# Patient Record
Sex: Female | Born: 1985 | Race: Black or African American | Hispanic: No | Marital: Single | Smoking: Never smoker
Health system: Southern US, Community
[De-identification: ages and names within clinical notes are randomized; demographics above are authoritative.]

## PROBLEM LIST (undated history)

## (undated) DIAGNOSIS — R519 Headache, unspecified: Secondary | ICD-10-CM

## (undated) DIAGNOSIS — Z87442 Personal history of urinary calculi: Secondary | ICD-10-CM

## (undated) DIAGNOSIS — Z8489 Family history of other specified conditions: Secondary | ICD-10-CM

## (undated) DIAGNOSIS — Z8614 Personal history of Methicillin resistant Staphylococcus aureus infection: Secondary | ICD-10-CM

## (undated) DIAGNOSIS — R51 Headache: Secondary | ICD-10-CM

## (undated) DIAGNOSIS — M797 Fibromyalgia: Secondary | ICD-10-CM

## (undated) HISTORY — PX: NO PAST SURGERIES: SHX2092

---

## 2011-07-15 DIAGNOSIS — Z8614 Personal history of Methicillin resistant Staphylococcus aureus infection: Secondary | ICD-10-CM

## 2011-07-15 HISTORY — DX: Personal history of Methicillin resistant Staphylococcus aureus infection: Z86.14

## 2016-06-09 ENCOUNTER — Emergency Department: Payer: Self-pay

## 2016-06-09 ENCOUNTER — Emergency Department
Admission: EM | Admit: 2016-06-09 | Discharge: 2016-06-09 | Disposition: A | Discharge status: Home / Self Care | Payer: Self-pay | Attending: Emergency Medicine | Admitting: Emergency Medicine

## 2016-06-09 ENCOUNTER — Encounter: Payer: Self-pay | Admitting: Emergency Medicine

## 2016-06-09 DIAGNOSIS — R079 Chest pain, unspecified: Secondary | ICD-10-CM | POA: Insufficient documentation

## 2016-06-09 DIAGNOSIS — R531 Weakness: Secondary | ICD-10-CM | POA: Insufficient documentation

## 2016-06-09 DIAGNOSIS — R42 Dizziness and giddiness: Secondary | ICD-10-CM | POA: Insufficient documentation

## 2016-06-09 DIAGNOSIS — R110 Nausea: Secondary | ICD-10-CM | POA: Insufficient documentation

## 2016-06-09 LAB — CBC
HEMATOCRIT: 40.3 % (ref 35.0–47.0)
Hemoglobin: 13.5 g/dL (ref 12.0–16.0)
MCH: 28.4 pg (ref 26.0–34.0)
MCHC: 33.5 g/dL (ref 32.0–36.0)
MCV: 84.9 fL (ref 80.0–100.0)
PLATELETS: 341 10*3/uL (ref 150–440)
RBC: 4.75 MIL/uL (ref 3.80–5.20)
RDW: 14.2 % (ref 11.5–14.5)
WBC: 11.5 10*3/uL — AB (ref 3.6–11.0)

## 2016-06-09 LAB — BASIC METABOLIC PANEL
Anion gap: 4 — ABNORMAL LOW (ref 5–15)
BUN: 13 mg/dL (ref 6–20)
CHLORIDE: 105 mmol/L (ref 101–111)
CO2: 28 mmol/L (ref 22–32)
Calcium: 9.3 mg/dL (ref 8.9–10.3)
Creatinine, Ser: 1.07 mg/dL — ABNORMAL HIGH (ref 0.44–1.00)
Glucose, Bld: 97 mg/dL (ref 65–99)
POTASSIUM: 3.9 mmol/L (ref 3.5–5.1)
SODIUM: 137 mmol/L (ref 135–145)

## 2016-06-09 LAB — GLUCOSE, CAPILLARY: GLUCOSE-CAPILLARY: 95 mg/dL (ref 65–99)

## 2016-06-09 LAB — FIBRIN DERIVATIVES D-DIMER (ARMC ONLY): Fibrin derivatives D-dimer (ARMC): 157 (ref 0–499)

## 2016-06-09 LAB — TROPONIN I: Troponin I: <0.03 ng/mL (ref <0.03)

## 2016-06-09 LAB — LIPASE, BLOOD: LIPASE: 19 U/L (ref 11–51)

## 2016-06-09 IMAGING — CR DG CHEST 2V
1 series · 2 of 2 positions shown · non-contrast
Comparison: None.

CLINICAL DATA: 30 y/o  F; mid sternal chest pain.

EXAM:
CHEST  2 VIEW

[Series 1: dg chest 2 view · 0.14mm/px · 2 of 2 slices shown]
[im 1/2]
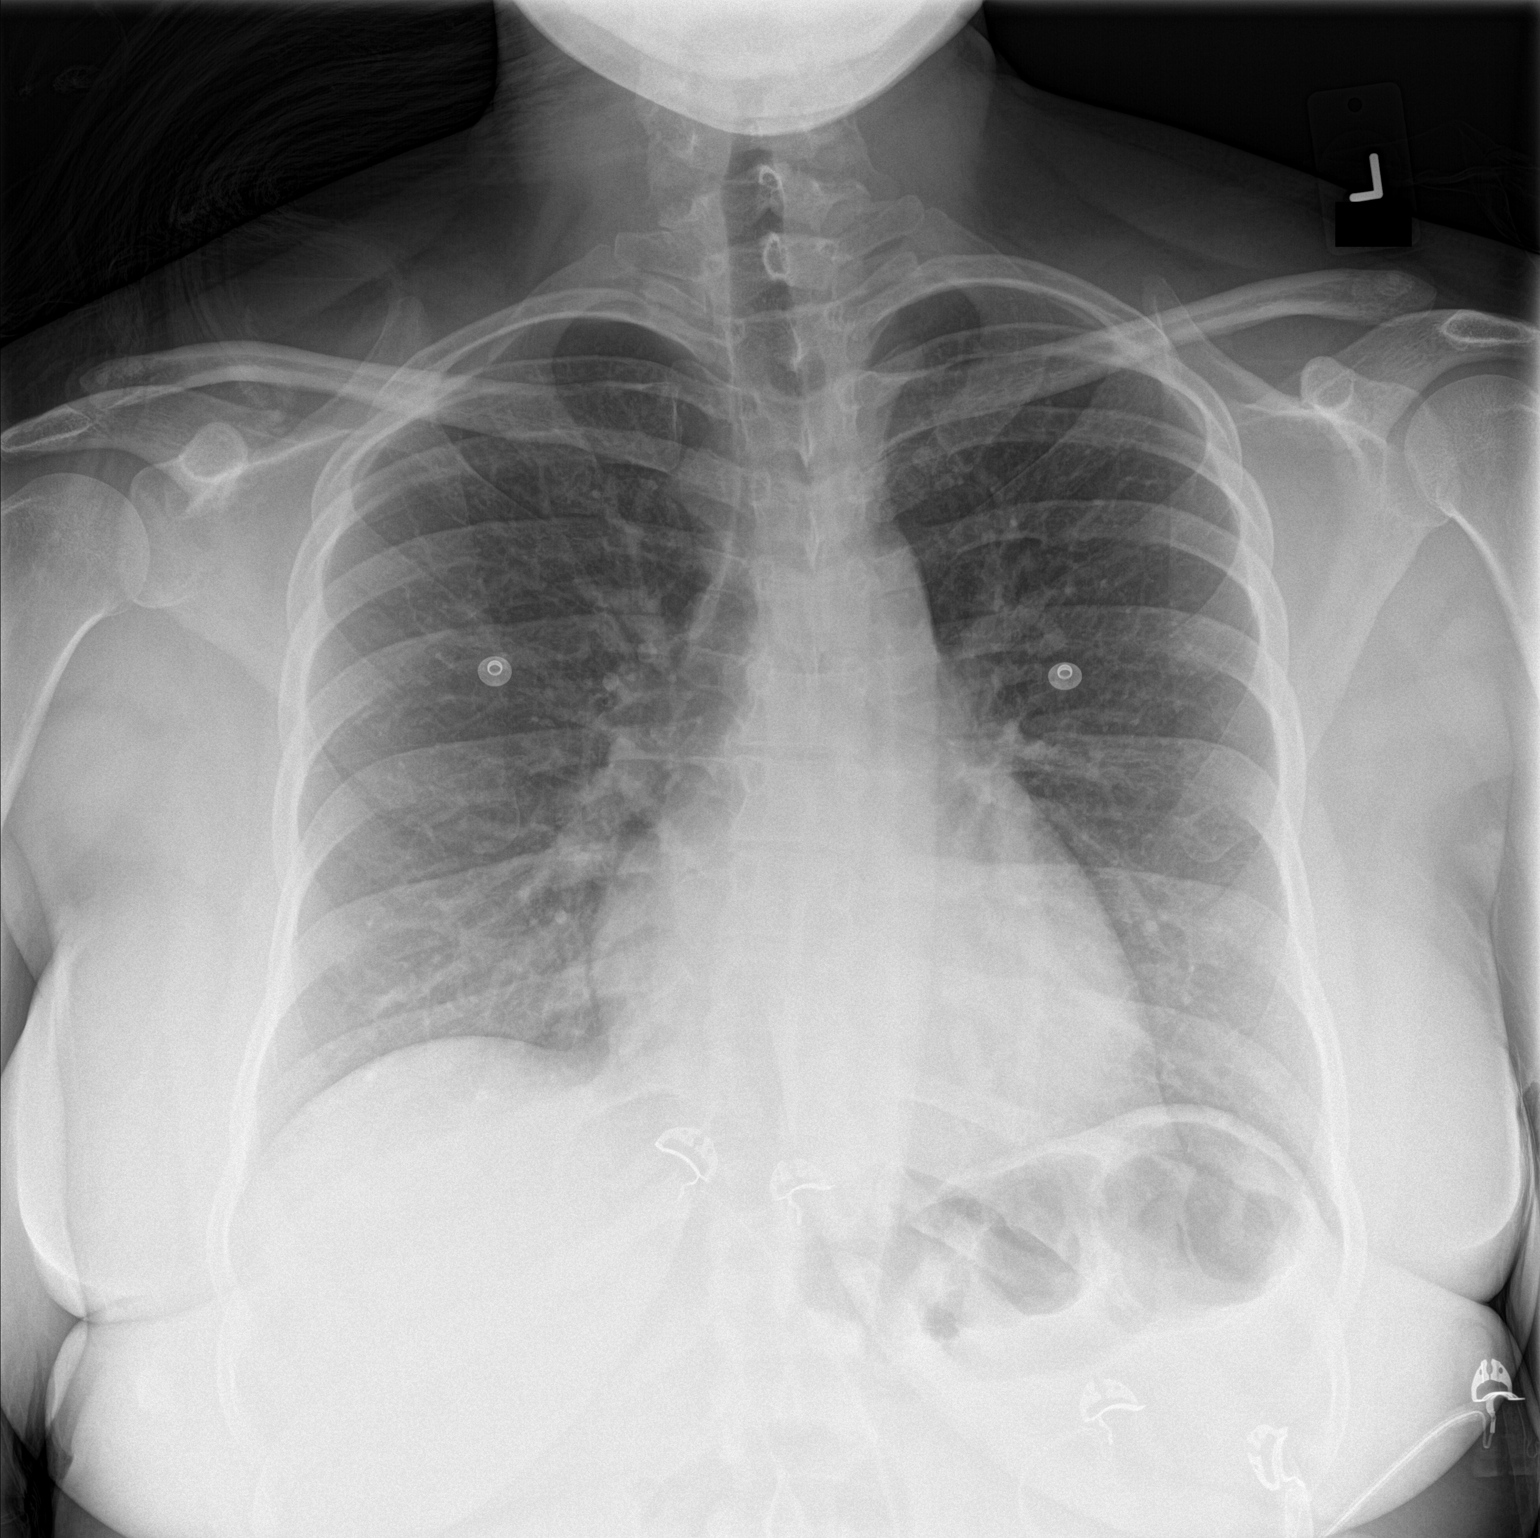
[im 2/2]
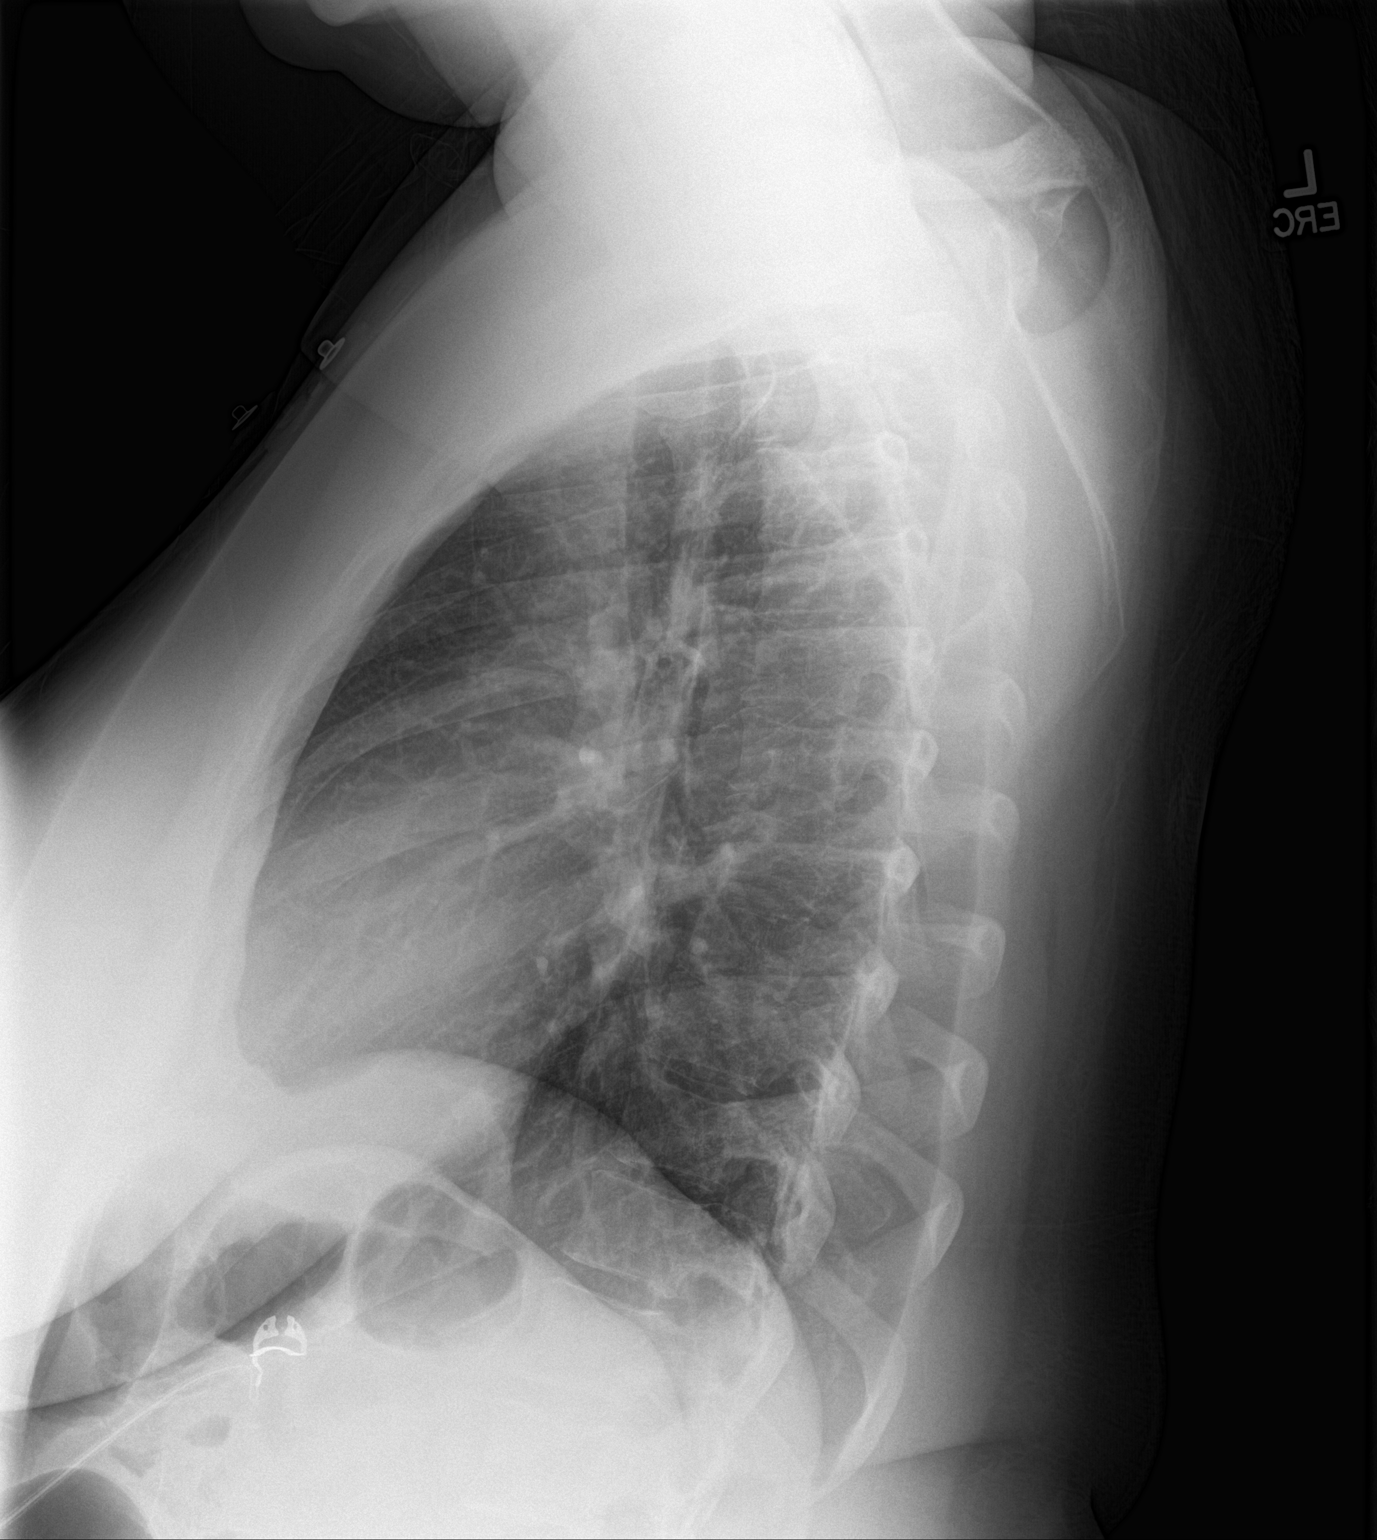

[2 of 2 positions shown; findings below may reference images not displayed]

FINDINGS: The heart size and mediastinal contours are within normal limits.
Both lungs are clear. The visualized skeletal structures are
unremarkable.
IMPRESSION: No active cardiopulmonary disease.

By: ANANKO M.D.

## 2016-06-09 MED ORDER — TRAMADOL HCL 50 MG PO TABS
50.0000 mg | ORAL_TABLET | Freq: Once | ORAL | Status: AC
  Administered 2016-06-09: 50 mg via ORAL
  Filled 2016-06-09: qty 1

## 2016-06-09 MED ORDER — KETOROLAC TROMETHAMINE 30 MG/ML IJ SOLN
30.0000 mg | Freq: Once | INTRAMUSCULAR | Status: AC
  Administered 2016-06-09: 30 mg via INTRAVENOUS
  Filled 2016-06-09: qty 1

## 2016-06-09 MED ORDER — ACETAMINOPHEN 325 MG PO TABS
ORAL_TABLET | ORAL | Status: AC
  Administered 2016-06-09: 650 mg via ORAL
  Filled 2016-06-09: qty 2

## 2016-06-09 MED ORDER — ONDANSETRON HCL 4 MG/2ML IJ SOLN
INTRAMUSCULAR | Status: AC
Start: 2016-06-09 — End: 2016-06-09
  Administered 2016-06-09: 4 mg via INTRAVENOUS
  Filled 2016-06-09: qty 2

## 2016-06-09 MED ORDER — ACETAMINOPHEN 325 MG PO TABS
650.0000 mg | ORAL_TABLET | Freq: Once | ORAL | Status: AC
  Administered 2016-06-09: 650 mg via ORAL

## 2016-06-09 MED ORDER — ONDANSETRON HCL 4 MG/2ML IJ SOLN
4.0000 mg | Freq: Once | INTRAMUSCULAR | Status: AC
  Administered 2016-06-09: 4 mg via INTRAVENOUS

## 2016-06-09 MED ORDER — GI COCKTAIL ~~LOC~~
30.0000 mL | Freq: Once | ORAL | Status: AC
  Administered 2016-06-09: 30 mL via ORAL
  Filled 2016-06-09: qty 30

## 2016-06-09 MED ORDER — SUCRALFATE 1 G PO TABS
1.0000 g | ORAL_TABLET | Freq: Three times a day (TID) | ORAL | Status: DC
  Administered 2016-06-09: 1 g via ORAL
  Filled 2016-06-09: qty 1

## 2016-06-09 NOTE — ED Provider Notes (Signed)
This patient was signed out to me by Dr. Zenda AlpersWebster. Her repeat troponin is negative and her d-dimer is very low probability. I have discussed these negative results with the patient, who is feeling much better at this time. Plan discharge.   Rockne MenghiniAnne-Caroline Ysidro Ramsay, MD 06/09/16 [phone]

## 2016-06-09 NOTE — ED Notes (Signed)
conts to c/o headaches and chest soreness  Dr Sharma CovertNorman aware  Tylenol ordered and given for same   Room dimmed and will reassess in 20 mins

## 2016-06-09 NOTE — Discharge Instructions (Signed)
Please make an appointment with a primary care physician for further evaluation of your chest pain.  Return to the emergency department for severe pain, shortness of breath, palpitations, lightheadedness or fainting, fever, or any other symptoms concerning to you.

## 2016-06-09 NOTE — ED Provider Notes (Signed)
[facility] Emergency Department Provider Note   ____________________________________________   First MD Initiated Contact with Patient 06/09/16 [phone]     (approximate)  I have reviewed the triage vital signs and the nursing notes.   HISTORY  Chief Complaint Chest Pain    HPI Toni Robertson is a 30 y.o. female who comes into the hospital today with chest pain. She reports that the pain is in the middle of her chest. It started around 07/28/2028. The patient was  At lunch for work. She reports that she's never had this pain before. The patient was eating a [place] sandwich. The patient denies any shortness of breath but feels as though someone is here in the chest. The pain is radiating to the back of her left shoulder. The patient is having some weakness and dizziness as well as nausea. She denies any vomiting but did have some elevated blood pressure while at work. The patient reports that her pain is 8 out of 10 in ity currently. She denies abdominal pain but does have some back and shoulder pain. The patient was unsure what was going on so she decided to come in to the hospital today for evaluation.   History reviewed. No pertinent past medical history.  There are no active problems to display for this patient.   History reviewed. No pertinent surgical history.  Prior to Admission medications   Not on File    Allergies Patient has no known allergies.  History reviewed. No pertinent family history.  Social History Social History  Substance Use Topics  . Smoking status: Never Smoker  . Smokeless tobacco: Never Used  . Alcohol use No    Review of Systems Constitutional: No fever/chills Eyes: No visual changes. ENT: No sore throat. Cardiovascular:  chest pain. Respiratory: Denies shortness of breath. Gastrointestinal: Nausea, No abdominal pain.  no vomiting.  No diarrhea.  No constipation. Genitourinary: Negative for  dysuria. Musculoskeletal: Negative for back pain. Skin: Negative for rash. Neurological: Negative for headaches, focal weakness or numbness.  10-point ROS otherwise negative.  ____________________________________________   PHYSICAL EXAM:  VITAL SIGNS: ED Triage Vitals  Enc Vitals Group     BP 06/09/16 0430 136/85     Pulse Rate 06/09/16 0401 85     Resp 06/09/16 0401 14     Temp 06/09/16 0401 98.3 F (36.8 C)     Temp Source 06/09/16 0401 Oral     SpO2 06/09/16 0401 100 %     Weight 06/09/16 0402 256 lb (116.1 kg)     Height 06/09/16 0402 5\' 5"  (1.651 m)     Head Circumference --      Peak Flow --      Pain Score 06/09/16 0402 8     Pain Loc --      Pain Edu? --      Excl. in GC? --     Constitutional: Alert and oriented. Well appearing and in moderate distress. Eyes: Conjunctivae are normal. PERRL. EOMI. Head: Atraumatic. Nose: No congestion/rhinnorhea. Mouth/Throat: Mucous membranes are moist.  Oropharynx non-erythematous. Cardiovascular: Normal rate, regular rhythm. Grossly normal heart sounds.  Good peripheral circulation. Respiratory: Normal respiratory effort.  No retractions. Lungs CTAB. Gastrointestinal: Soft and nontender. No distention. Positive bowel sounds Musculoskeletal: No lower extremity tenderness nor edema.  Neurologic:  Normal speech and language.  Skin:  Skin is warm, dry and intact.  Psychiatric: Mood and affect are normal.   ____________________________________________   LABS (all labs ordered are listed,  but only abnormal results are displayed)  Labs Reviewed  BASIC METABOLIC PANEL - Abnormal; Notable for the following:       Result Value   Creatinine, Ser 1.07 (*)    Anion gap 4 (*)    All other components within normal limits  CBC - Abnormal; Notable for the following:    WBC 11.5 (*)    All other components within normal limits  TROPONIN I  GLUCOSE, CAPILLARY  LIPASE, BLOOD  TROPONIN I  FIBRIN DERIVATIVES D-DIMER ([facility] ONLY)   CBG MONITORING, ED   ____________________________________________  EKG  ED ECG REPORT I, Rebecka ApleyWebster,  Vence Lalor P, the attending physician, personally viewed and interpreted this ECG.   Date: 06/09/2016  EKG Time: 400  Rate: 85  Rhythm: normal sinus rhythm  Axis: normal  Intervals:none  ST&T Change: none  ____________________________________________  RADIOLOGY  CXR ____________________________________________   PROCEDURES  Procedure(s) performed: None  Procedures  Critical Care performed: No  ____________________________________________   INITIAL IMPRESSION / ASSESSMENT AND PLAN / ED COURSE  Pertinent labs & imaging results that were available during my care of the patient were reviewed by me and considered in my medical decision making (see chart for details).  Is a 30 year old female who comes into the hospital today with some chest pain. She was eating when it started. I will give the patient some tramadol and a GI cocktail. The patient will be reassessed.   Clinical Course as of Jun 09 753  [facility]  0520 No active cardiopulmonary disease. DG Chest 2 View [AW]    Clinical Course User Index [AW] Rebecka ApleyAllison P Nikkole Placzek, MD    The patient's pain Returned after some time. I did give the patient some Toradol and Carafate. She then told the nurse that the pain did seem to get worse when she takes a deep breath so I will send a d-dimer as well as a repeat troponin. The patient will be reassessed by Dr. Sharma CovertNorman. ____________________________________________   FINAL CLINICAL IMPRESSION(S) / ED DIAGNOSES  Final diagnoses:  Chest pain, unspecified type      NEW MEDICATIONS STARTED DURING THIS VISIT:  New Prescriptions   No medications on file     Note:  This document was prepared using Dragon voice recognition software and may include unintentional dictation errors.    Rebecka ApleyAllison P Ashana Tullo, MD 06/09/16 [phone]

## 2016-06-09 NOTE — ED Triage Notes (Signed)
Pt arrived by EMS from work with c/o of mid sternal chest pain that started earlier in the night, not relieved by 324mg  Aspirin or 1 nitro given by EMS. Pt is alert and oriented x4 upon arrival to ED, pt c/o 8/10 chest pain and 8/10 headache.

## 2016-06-09 NOTE — ED Notes (Signed)
States her pain is returning  And pain increases with inspiration  Monitor shows NSR  resp even and non labored   Dr Zenda AlpersWebster aware

## 2016-09-29 IMAGING — US US TRANSVAGINAL NON-OB
1 series · 14 of 25 positions shown · non-contrast
Comparison: None

CLINICAL DATA: Pelvic pain

EXAM:
TRANSABDOMINAL AND TRANSVAGINAL ULTRASOUND OF PELVIS
TECHNIQUE: Both transabdominal and transvaginal ultrasound examinations of the
pelvis were performed. Transabdominal technique was performed for
global imaging of the pelvis including uterus, ovaries, adnexal
regions, and pelvic cul-de-sac. It was necessary to proceed with
endovaginal exam following the transabdominal exam to visualize the
uterus, endometrium, ovaries and adnexa .

[Series 1: us transvaginal non-ob · 0.23mm/px · 14 of 108 slices shown]
[im 1/108]
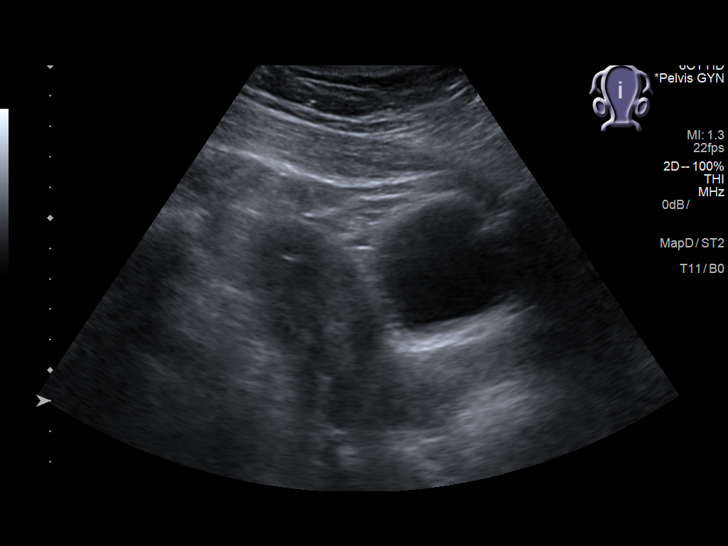
[im 9/108]
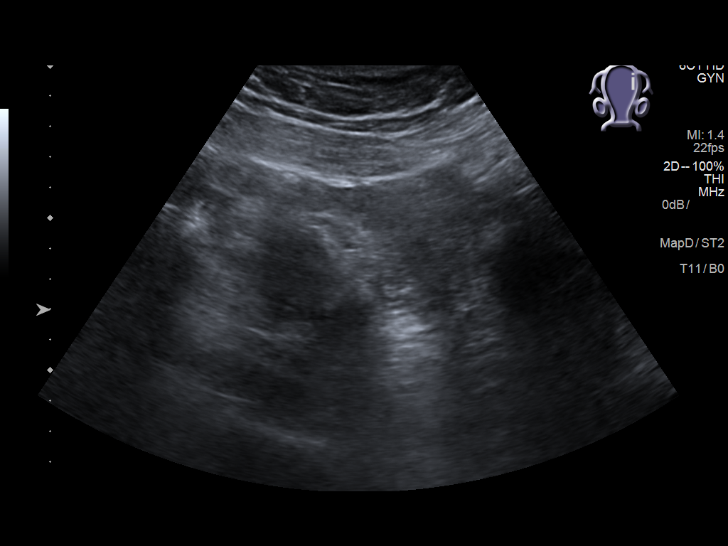
[im 18/108]
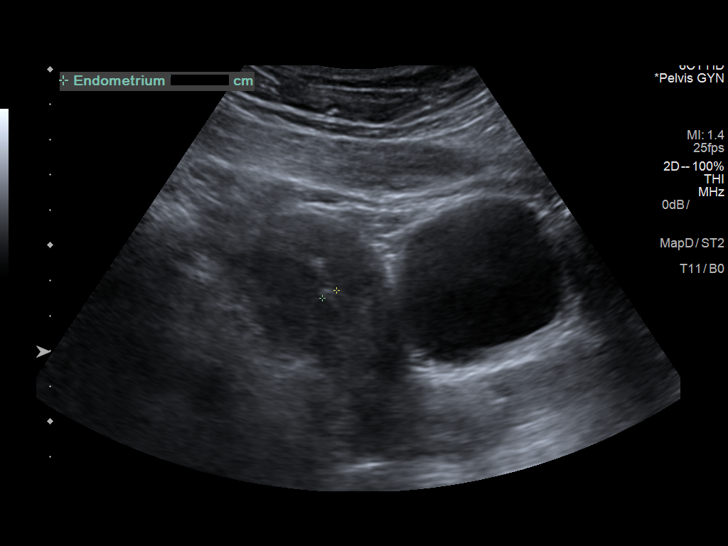
[im 27/108]
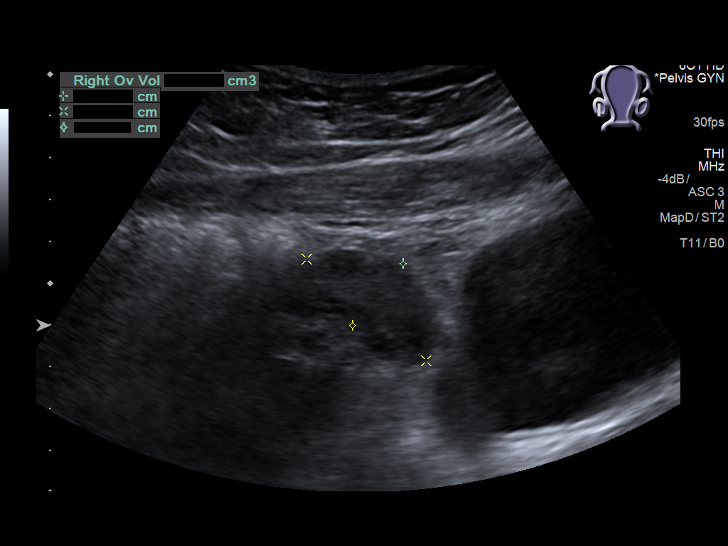
[im 36/108]
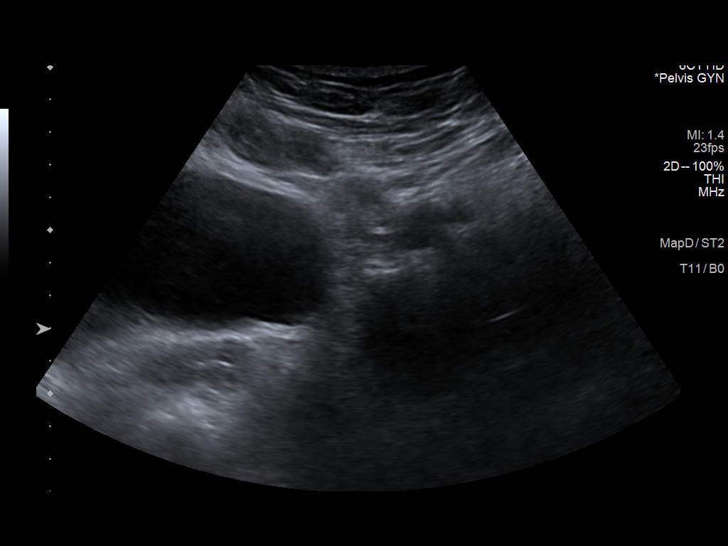
[im 41/108]
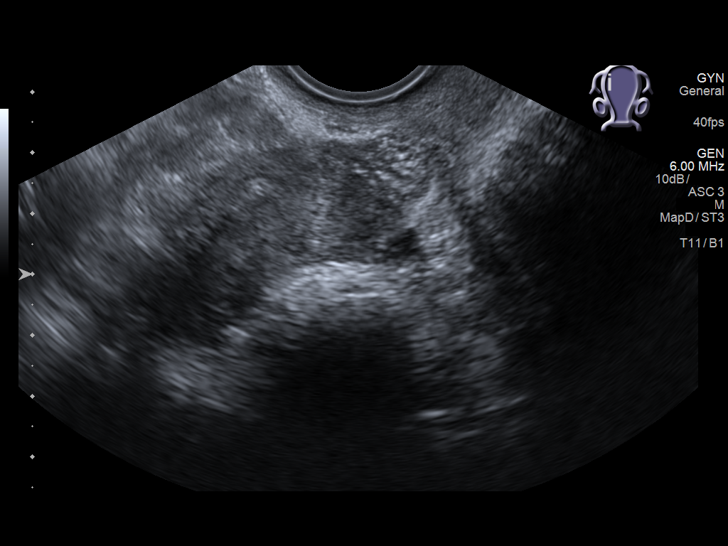
[im 50/108]
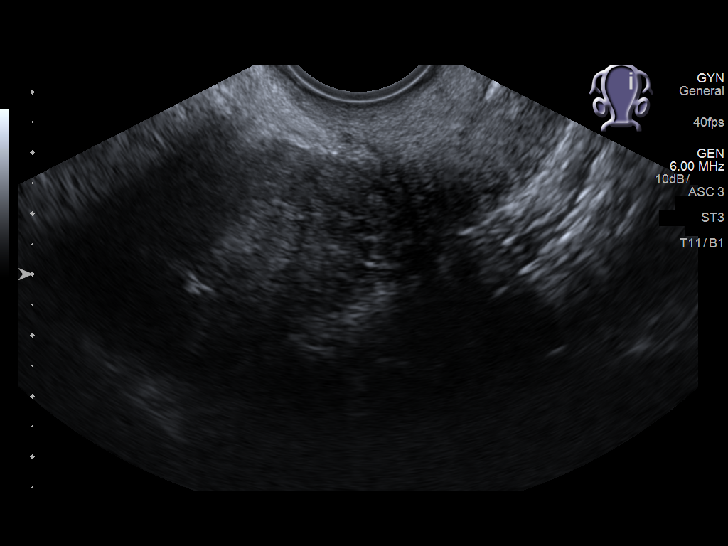
[im 58/108]
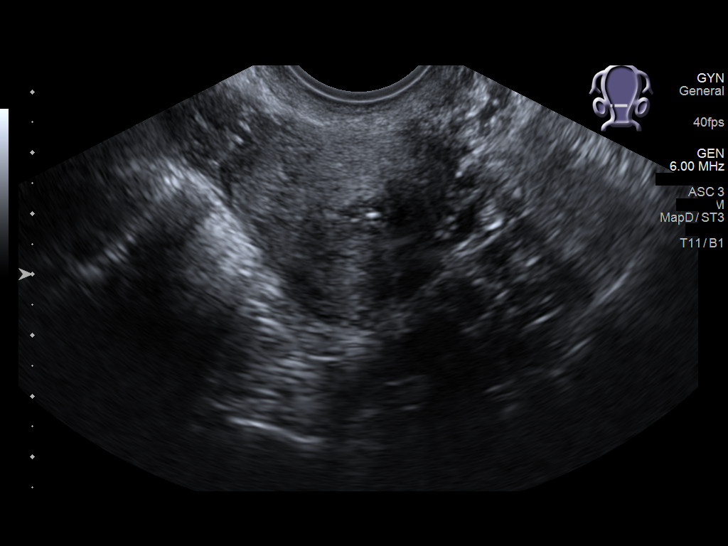
[im 67/108]
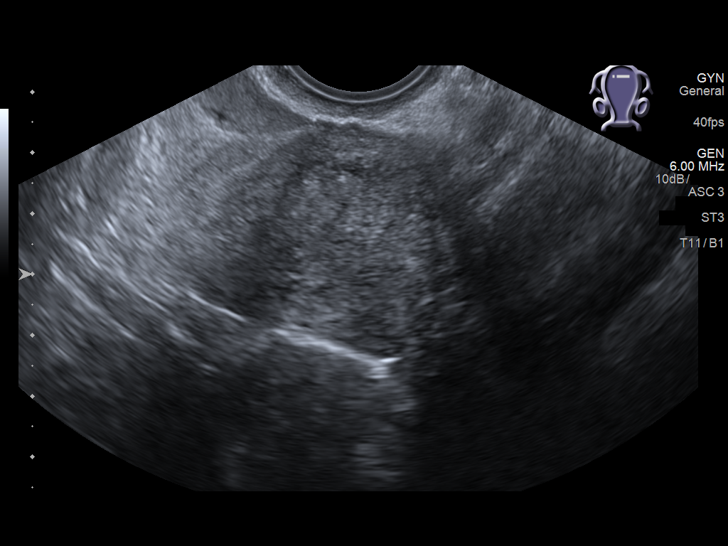
[im 72/108]
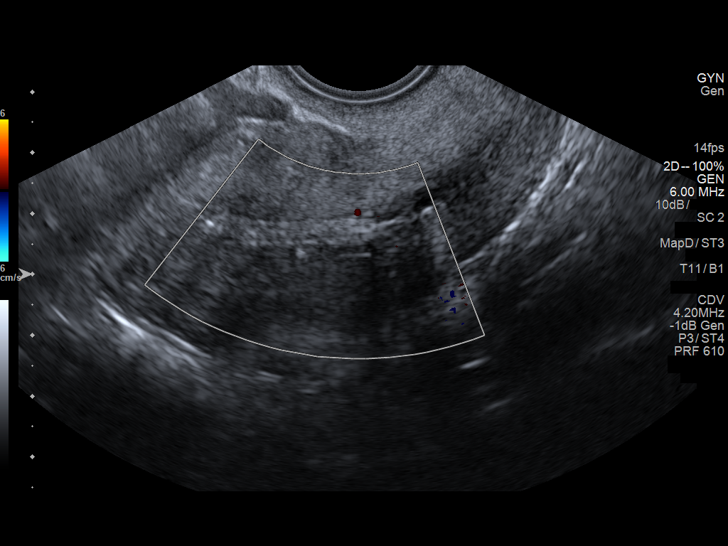
[im 81/108]
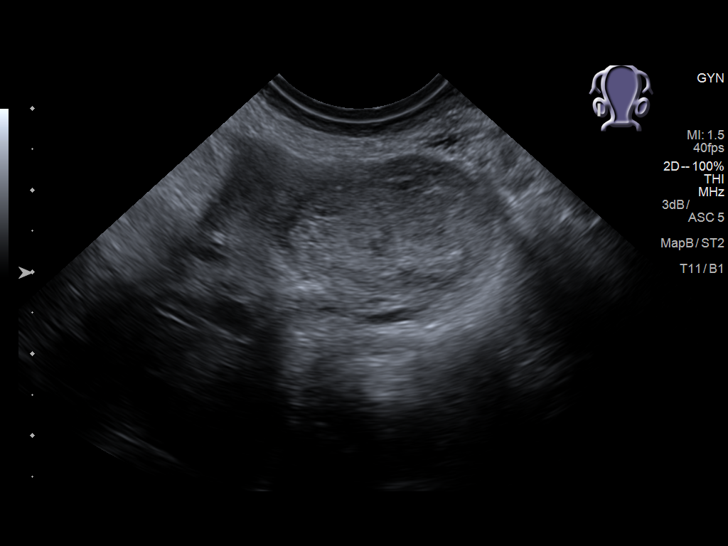
[im 90/108]
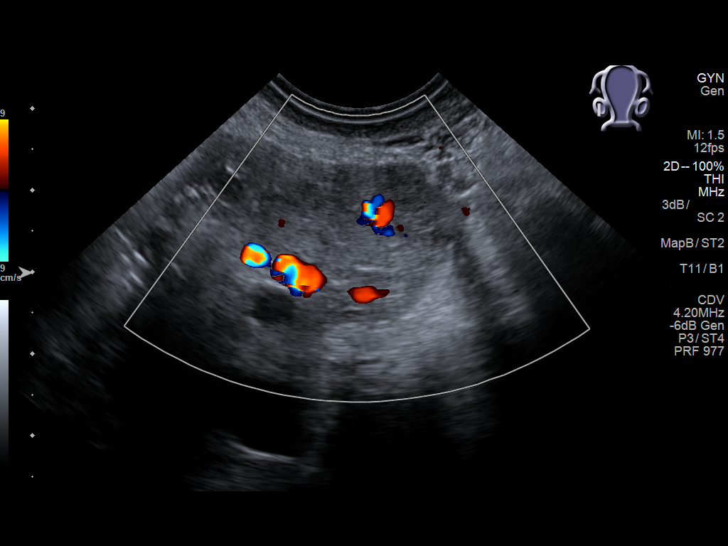
[im 99/108]
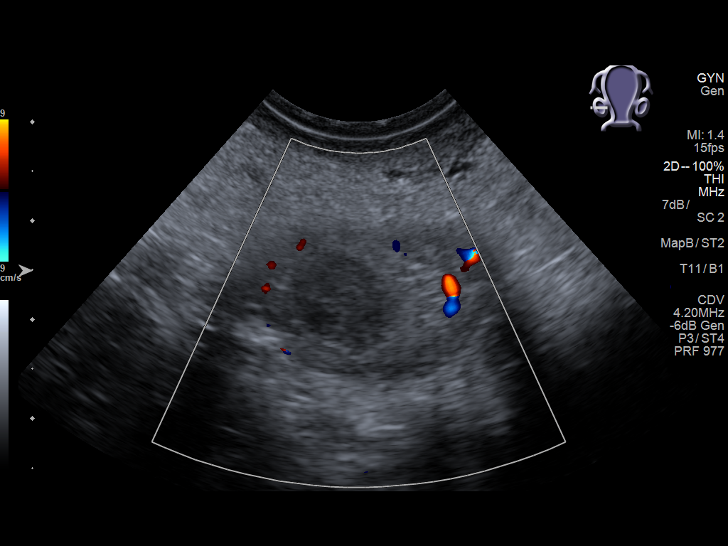
[im 108/108]
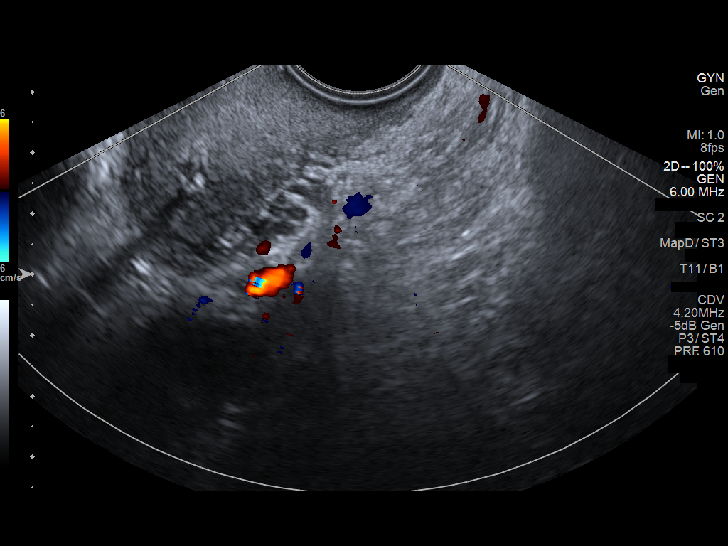

[14 of 25 positions shown; findings below may reference images not displayed]

FINDINGS: Uterus

Measurements: 8.1 x 4.4 x 4.8 cm. No fibroids or other mass
visualized.

Endometrium

Thickness: 5 mm in thickness. IUD in place, somewhat tilted to the
left. No focal abnormality visualized.

Right ovary

Measurements: 4.2 x 2.2 x 3.2 cm. Normal appearance/no adnexal mass.

Left ovary

Measurements: Not visualized. No adnexal mass seen.

Other findings

No abnormal free fluid.
IMPRESSION: Unremarkable pelvic ultrasound.

IUD noted within the endometrium, somewhat tilted to the left.

## 2017-01-02 ENCOUNTER — Ambulatory Visit
Admission: EM | Admit: 2017-01-02 | Discharge: 2017-01-02 | Disposition: A | Discharge status: Home / Self Care | Payer: Self-pay | Attending: Family Medicine | Admitting: Family Medicine

## 2017-01-02 DIAGNOSIS — K0889 Other specified disorders of teeth and supporting structures: Secondary | ICD-10-CM

## 2017-01-02 MED ORDER — IBUPROFEN 800 MG PO TABS: 800.0000 mg | ORAL_TABLET | Freq: Three times a day (TID) | ORAL | 0 refills | Status: DC | PRN

## 2017-01-02 MED ORDER — OXYCODONE-ACETAMINOPHEN 5-325 MG PO TABS: 1.0000 | ORAL_TABLET | Freq: Three times a day (TID) | ORAL | 0 refills | Status: DC | PRN

## 2017-01-02 MED ORDER — PENICILLIN V POTASSIUM 500 MG PO TABS: 500.0000 mg | ORAL_TABLET | Freq: Four times a day (QID) | ORAL | 0 refills | Status: DC

## 2017-01-02 NOTE — Discharge Instructions (Signed)
Take medication as prescribed. Rest. Soft food diet. Drink plenty of fluids.   Follow up with your dentist this week.   Follow up with your primary care physician this week as needed. Return to Urgent care for new or worsening concerns.

## 2017-01-02 NOTE — ED Triage Notes (Signed)
Patient complains of right bottom tooth pain. Patient states that tooth broke today while at work. Patient states that she is scheduled on Monday with her dentist for implants. Patient states that pain is 10/10.

## 2017-01-02 NOTE — ED Provider Notes (Signed)
[facility] ____________________________________________  Time seen: Approximately 7:45 PM  I have reviewed the triage vital signs and the nursing notes.   HISTORY  Chief Complaint Dental Pain    HPI Toni Robertson is a 31 y.o. female  presenting for evaluation of dental pain. Patient reports that she knows she has dental issues and had a bad right upper tooth for a while. Patient reports that she had an appointment for this coming Monday to have tooth pulled and a dental implant placed. Patient reports that earlier today while she was eating the same tooth broke causing pain. Patient reports that pain is a moderate throbbing pain that comes and goes in intensity but always hurts. Reports unrelieved with over-the-counter ibuprofen. Reports hurts to eat and drink, but has continued to eat and drink well. Denies trauma or other injuries. Reports otherwise feels well. Denies any fevers. Denies any cough, congestion, sore throat. Denies any recent sickness or recent antibiotic use. Denies other aggravating or alleviating factors.  Patient's last menstrual period was 12/19/2016. Denies pregnancy   History reviewed. No pertinent past medical history. Denies chronic medical problems. There are no active problems to display for this patient.   Past Surgical History:  Procedure Laterality Date  . NO PAST SURGERIES      Current Outpatient Rx  . Order #: [id number] Class: Normal  . Order #: [id number] Class: Print  . Order #: [id number] Class: Normal    Allergies Patient has no known allergies.  History reviewed. No pertinent family history.  Social History Social History  Substance Use Topics  . Smoking status: Never Smoker  . Smokeless tobacco: Never Used  . Alcohol use No    Review of Systems Constitutional: No fever/chills. Reports continues to eat and drink foods and fluids well.  Eyes: No visual changes. ENT: No sore throat.As above. Cardiovascular: Denies  chest pain. Respiratory: Denies shortness of breath. Gastrointestinal: No abdominal pain.  No nausea, no vomiting.  Genitourinary: Negative for dysuria. Musculoskeletal: Negative for back pain.   ____________________________________________   PHYSICAL EXAM:  VITAL SIGNS: ED Triage Vitals  Enc Vitals Group     BP 01/02/17 1928 (!) 148/83     Pulse Rate 01/02/17 1928 82     Resp 01/02/17 1928 17     Temp 01/02/17 1928 98.9 F (37.2 C)     Temp Source 01/02/17 1928 Oral     SpO2 01/02/17 1928 99 %     Weight 01/02/17 1927 220 lb (99.8 kg)     Height 01/02/17 1927 5\' 4"  (1.626 m)     Head Circumference --      Peak Flow --      Pain Score 01/02/17 1927 9     Pain Loc --      Pain Edu? --      Excl. in GC? --     Constitutional: Alert and oriented. Well appearing and in no acute distress. Eyes: Conjunctivae are normal. Head: Atraumatic.No facial swelling noted. Ears: Bilateral ears no erythema, normal TMs.  Nose: No congestion/rhinnorhea. Mouth/Throat: Mucous membranes are moist.  Oropharynx non-erythematous. No tonsillar swelling or exudate. Periodontal Exam     Fracture with carie present to right upper tooth #5 with localize surrounding gum erythema, no palpated fluctuance or dental abscess noted, widespread dental decay with multiple dental caries and fractures. No other dental tenderness noted. Neck: No stridor.  Hematological/Lymphatic/Immunilogical: No cervical lymphadenopathy. Cardiovascular:   Normal rate, regular rhythm. Grossly normal heart sounds. Good peripheral circulation. Respiratory: Normal respiratory  effort.  No retractions. Musculoskeletal:No midline cervical, thoracic or lumbar tenderness to palpation.  Neurologic:  Normal speech and language.Speech is normal. No gait instability. Skin:  Skin is warm, dry  Psychiatric: Mood and affect are normal. Speech and behavior are normal.  ____________________________________________   LABS (all labs  ordered are listed, but only abnormal results are displayed)  Labs Reviewed - No data to display ____________________________________________   INITIAL IMPRESSION / ASSESSMENT AND PLAN / ED COURSE  Pertinent labs & imaging results that were available during my care of the patient were reviewed by me and considered in my medical decision making (see chart for details).  Well-appearing patient. No acute distress. Right upper dental pain with known cavity that fracture today. Widespread dental decay. As gumline is inflamed appearing will start patient on oral Pen-VK, 800 mg ibuprofen and Percocet as needed for breakthrough pain. Quantity 9 given. Encouraged rest, fluids, soft food diet and supportive care. Follow-up with her dentist this coming Monday. States her dentist was closed today.   [place] controlled substance database reviewed, and no recent controlled medications documented.  Patient was advised to see the dentist.. Also advised to take the antibiotic until finished. Instructed to return to the Urgent Care or ER  for symptoms that change or worsen or if unable to schedule an appointment. ____________________________________________   FINAL CLINICAL IMPRESSION(S) / ED DIAGNOSES  Final diagnoses:  Pain, dental         Renford Dills, NP 01/02/17 1950

## 2017-02-18 ENCOUNTER — Encounter: Payer: Self-pay | Admitting: *Deleted

## 2017-02-18 ENCOUNTER — Ambulatory Visit
Admission: EM | Admit: 2017-02-18 | Discharge: 2017-02-18 | Disposition: A | Discharge status: Home / Self Care | Payer: BLUE CROSS/BLUE SHIELD | Attending: Family Medicine | Admitting: Family Medicine

## 2017-02-18 DIAGNOSIS — R51 Headache: Secondary | ICD-10-CM

## 2017-02-18 DIAGNOSIS — R197 Diarrhea, unspecified: Secondary | ICD-10-CM | POA: Diagnosis not present

## 2017-02-18 DIAGNOSIS — R110 Nausea: Secondary | ICD-10-CM

## 2017-02-18 MED ORDER — ONDANSETRON 8 MG PO TBDP
8.0000 mg | ORAL_TABLET | Freq: Once | ORAL | Status: AC
  Administered 2017-02-18: 8 mg via ORAL

## 2017-02-18 MED ORDER — ONDANSETRON 8 MG PO TBDP: 8.0000 mg | ORAL_TABLET | Freq: Two times a day (BID) | ORAL | 0 refills | Status: DC

## 2017-02-18 NOTE — ED Provider Notes (Signed)
[facility]    CSN: [id number] Arrival date & time: 02/18/17  [id number]     History   Chief Complaint Chief Complaint  Patient presents with  . Diarrhea  . Headache    HPI Toni Robertson is a 31 y.o. female.   HPI  This a 31 year old female presents with 2 day history of headache and diarrhea. He states that the diarrhea is watery but without any blood or mucus. He does not have any abdominal pain but has noticed cramping prior to a BM. She denies any fever or chills. She has started a new job at [employer] and this is been very stressful for her. He denies eating outside of the home. No one else in her immediate family has been sick. She has nausea but has not vomited. She also has not been eating solid food in fear of having to vomit. She states that she had 5 bowel movements so far this morning and had only 3 bowel movements yesterday        History reviewed. No pertinent past medical history.  There are no active problems to display for this patient.   Past Surgical History:  Procedure Laterality Date  . NO PAST SURGERIES      OB History    No data available       Home Medications    Prior to Admission medications   Medication Sig Start Date End Date Taking? Authorizing Provider  ibuprofen (ADVIL,MOTRIN) 800 MG tablet Take 1 tablet (800 mg total) by mouth every 8 (eight) hours as needed for mild pain or moderate pain. 01/02/17   Renford DillsMiller, Lindsey, NP  ondansetron (ZOFRAN ODT) 8 MG disintegrating tablet Take 1 tablet (8 mg total) by mouth 2 (two) times daily. 02/18/17   Lutricia Feiloemer, Saisha [place]ogue P, PA-C  oxyCODONE-acetaminophen (ROXICET) 5-325 MG tablet Take 1 tablet by mouth every 8 (eight) hours as needed for moderate pain or severe pain (Do not drive or operate heavy machinery while taking as can cause drowsiness.). 01/02/17   Renford DillsMiller, Lindsey, NP  penicillin v potassium (VEETID) 500 MG tablet Take 1 tablet (500 mg total) by mouth 4 (four) times daily. 01/02/17   Renford DillsMiller,  Lindsey, NP    Family History History reviewed. No pertinent family history.  Social History Social History  Substance Use Topics  . Smoking status: Never Smoker  . Smokeless tobacco: Never Used  . Alcohol use No     Allergies   Patient has no known allergies.   Review of Systems Review of Systems  Constitutional: Positive for activity change and appetite change. Negative for chills, diaphoresis, fatigue and fever.  Gastrointestinal: Positive for abdominal pain, diarrhea and nausea. Negative for abdominal distention, anal bleeding, blood in stool, constipation and vomiting.  All other systems reviewed and are negative.    Physical Exam Triage Vital Signs ED Triage Vitals  Enc Vitals Group     BP 02/18/17 0845 137/80     Pulse Rate 02/18/17 0845 63     Resp 02/18/17 0845 16     Temp 02/18/17 0845 98.4 F (36.9 C)     Temp Source 02/18/17 0845 Oral     SpO2 02/18/17 0845 100 %     Weight 02/18/17 0847 225 lb (102.1 kg)     Height 02/18/17 0847 5\' 4"  (1.626 m)     Head Circumference --      Peak Flow --      Pain Score --      Pain Loc --  Pain Edu? --      Excl. in GC? --    No data found.   Updated Vital Signs BP 137/80 (BP Location: Left Arm)   Pulse 63   Temp 98.4 F (36.9 C) (Oral)   Resp 16   Ht 5\' 4"  (1.626 m)   Wt 225 lb (102.1 kg)   LMP 02/04/2017 (Approximate)   SpO2 100%   BMI 38.62 kg/m   Visual Acuity Right Eye Distance:   Left Eye Distance:   Bilateral Distance:    Right Eye Near:   Left Eye Near:    Bilateral Near:     Physical Exam  Constitutional: She appears well-developed and well-nourished. No distress.  HENT:  Head: Normocephalic and atraumatic.  Eyes: Pupils are equal, round, and reactive to light. Right eye exhibits no discharge. Left eye exhibits no discharge.  Neck: Normal range of motion.  Pulmonary/Chest: Effort normal and breath sounds normal.  Abdominal: Soft. Bowel sounds are normal. She exhibits no  distension. There is tenderness. There is no rebound and no guarding.  Abdominal examination shows tenderness in the left upper quadrant  through lower. No rebound there is no guarding present. No masses are palpable. Patient is in no acute distress.  Skin: She is not diaphoretic.  Nursing note and vitals reviewed.    UC Treatments / Results  Labs (all labs ordered are listed, but only abnormal results are displayed) Labs Reviewed - No data to display  EKG  EKG Interpretation None       Radiology No results found.  Procedures Procedures (including critical care time)  Medications Ordered in UC Medications  ondansetron (ZOFRAN-ODT) disintegrating tablet 8 mg (8 mg Oral Given 02/18/17 0931)     Initial Impression / Assessment and Plan / UC Course  I have reviewed the triage vital signs and the nursing notes.  Pertinent labs & imaging results that were available during my care of the patient were reviewed by me and considered in my medical decision making (see chart for details).       Final Clinical Impressions(s) / UC Diagnoses   Final diagnoses:  Diarrhea, unspecified type  Nausea     New Prescriptions Discharge Medication List as of 02/18/2017 10:22 AM    START taking these medications   Details  ondansetron (ZOFRAN ODT) 8 MG disintegrating tablet Take 1 tablet (8 mg total) by mouth 2 (two) times daily., Starting Wed 02/18/2017, Normal       Plan: 1. Test/x-ray results and diagnosis reviewed with patient 2. rx as per orders; risks, benefits, potential side effects reviewed with patient 3. Recommend supportive treatment with Hydration to keep urine clear. Use the Zofran to prevent nausea. When you return to eating normally start with a brat diet and advance as tolerated. If she has increased fever more pain bloody stool she should go immediately to the emergency room. We'll keep her out of work today and tomorrow and allow her to return to work on Friday. 4. F/u  prn if symptoms worsen or don't improve   Controlled Substance Prescriptions [place] Controlled Substance Registry consulted? Not Applicable   Lutricia Feil, PA-C 02/18/17 1029

## 2017-02-18 NOTE — ED Triage Notes (Signed)
Headache and diarrhea x2 days.

## 2017-04-01 ENCOUNTER — Encounter: Payer: Self-pay | Admitting: Emergency Medicine

## 2017-04-01 ENCOUNTER — Ambulatory Visit
Admission: EM | Admit: 2017-04-01 | Discharge: 2017-04-01 | Disposition: A | Discharge status: Home / Self Care | Payer: BLUE CROSS/BLUE SHIELD | Attending: Family Medicine | Admitting: Family Medicine

## 2017-04-01 DIAGNOSIS — J029 Acute pharyngitis, unspecified: Secondary | ICD-10-CM | POA: Diagnosis not present

## 2017-04-01 DIAGNOSIS — J069 Acute upper respiratory infection, unspecified: Secondary | ICD-10-CM | POA: Diagnosis not present

## 2017-04-01 DIAGNOSIS — R51 Headache: Secondary | ICD-10-CM

## 2017-04-01 LAB — RAPID STREP SCREEN (MED CTR MEBANE ONLY): Streptococcus, Group A Screen (Direct): NEGATIVE

## 2017-04-01 MED ORDER — KETOROLAC TROMETHAMINE 60 MG/2ML IM SOLN
30.0000 mg | Freq: Once | INTRAMUSCULAR | Status: AC
  Administered 2017-04-01: 30 mg via INTRAMUSCULAR

## 2017-04-01 NOTE — ED Triage Notes (Signed)
Patient c/o sore throat and HAs that started today.  

## 2017-04-01 NOTE — Discharge Instructions (Signed)
Rest. Drink plenty of fluids.  ° °Follow up with your primary care physician this week as needed. Return to Urgent care for new or worsening concerns.  ° °

## 2017-04-01 NOTE — ED Provider Notes (Addendum)
[facility] ____________________________________________  Time seen: Approximately 5:51 PM  I have reviewed the triage vital signs and the nursing notes.   HISTORY  Chief Complaint Sore Throat   HPI Toni Robertson is a 31 y.o. female present for evaluation of some runny nose, nasal congestion, sore throat present since yesterday evening. States today with some headache, mostly to the front of her head, denies abrupt onset. States sore throat does hurt to swallow, and mild to moderate currently. Reports has been still eating and drinking overall well. Denies known fevers. No over the counter medication taken for the same complaints. Denies known sick contacts, but does work around a lot of people. Denies aggravating or alleviating factors.Denies chest pain, shortness of breath, dizziness, vision changes, abdominal pain, dysuria, extremity pain, extremity swelling or rash. Denies recent sickness. Denies recent antibiotic use. States that she had to leave work early today.  Patient's last menstrual period was 03/11/2017 (approximate). Denies pregnancy.   History reviewed. No pertinent past medical history.  There are no active problems to display for this patient.   Past Surgical History:  Procedure Laterality Date  . NO PAST SURGERIES       No current facility-administered medications for this encounter.   Current Outpatient Prescriptions:  .  levonorgestrel (MIRENA) 20 MCG/24HR IUD, 1 each by Intrauterine route once., Disp: , Rfl:  .  ibuprofen (ADVIL,MOTRIN) 800 MG tablet, Take 1 tablet (800 mg total) by mouth every 8 (eight) hours as needed for mild pain or moderate pain., Disp: 15 tablet, Rfl: 0  Allergies Patient has no known allergies.  History reviewed. No pertinent family history.  Social History Social History  Substance Use Topics  . Smoking status: Never Smoker  . Smokeless tobacco: Never Used  . Alcohol use No    Review of  Systems Constitutional: No fever/chills Eyes: No visual changes. ENT: Positive sore throat. Cardiovascular: Denies chest pain. Respiratory: Denies shortness of breath. Gastrointestinal: No abdominal pain.  No nausea, no vomiting.  No diarrhea.   Musculoskeletal: Negative for back pain. Skin: Negative for rash. Neurological: Negative for focal weakness or numbness.    ____________________________________________   PHYSICAL EXAM:  VITAL SIGNS: ED Triage Vitals  Enc Vitals Group     BP 04/01/17 1615 124/64     Pulse Rate 04/01/17 1615 82     Resp 04/01/17 1615 16     Temp 04/01/17 1615 97.7 F (36.5 C)     Temp Source 04/01/17 1615 Oral     SpO2 04/01/17 1615 100 %     Weight 04/01/17 1613 257 lb (116.6 kg)     Height 04/01/17 1613  (1.626 m)     Head Circumference --      Peak Flow --      Pain Score 04/01/17 1613 9     Pain Loc --      Pain Edu? --      Excl. in GC? --    Constitutional: Alert and oriented. Well appearing and in no acute distress. Eyes: Conjunctivae are normal. PERRL.  Head: Atraumatic. No sinus tenderness to palpation. No swelling. No erythema.  Ears: no erythema, normal TMs bilaterally.   Nose:Nasal congestion  Mouth/Throat: Mucous membranes are moist. Mild pharyngeal erythema. No tonsillar swelling or exudate.  Neck: No stridor.  No cervical spine tenderness to palpation. Hematological/Lymphatic/Immunilogical: No cervical lymphadenopathy. Cardiovascular: Normal rate, regular rhythm. Grossly normal heart sounds.  Good peripheral circulation. Respiratory: Normal respiratory effort.  No retractions. No wheezes, rales  or rhonchi. Good air movement.  Gastrointestinal: Soft and nontender. Musculoskeletal: Ambulatory with steady gait. No cervical, thoracic or lumbar tenderness to palpation. Neurologic:  Normal speech and language. No gait instability.  No gross focal neurologic deficits are appreciated. Skin:  Skin appears warm, dry and intact. No  rash noted. Psychiatric: Mood and affect are normal. Speech and behavior are normal.  ___________________________________________   LABS (all labs ordered are listed, but only abnormal results are displayed)  Labs Reviewed  RAPID STREP SCREEN (NOT AT [facility])  CULTURE, GROUP A STREP [facility])   ____________________________________________  PROCEDURES Procedures    INITIAL IMPRESSION / ASSESSMENT AND PLAN / ED COURSE  Pertinent labs & imaging results that were available during my care of the patient were reviewed by me and considered in my medical decision making (see chart for details).  Well-appearing patient. No acute distress. Suspect viral upper respiratory infection. Quick strep negative, will culture. Encouraged rest, fluids, supportive care. 30 mg IM Toradol given once in urgent care. Work note given for today and tomorrow.Discussed indication, risks and benefits of medications with patient.  Discussed follow up with Primary care physician this week. Discussed follow up and return parameters including no resolution or any worsening concerns. Patient verbalized understanding and agreed to plan.   ____________________________________________   FINAL CLINICAL IMPRESSION(S) / ED DIAGNOSES  Final diagnoses:  Pharyngitis, unspecified etiology  Upper respiratory tract infection, unspecified type     Discharge Medication List as of 04/01/2017  6:01 PM      Note: This dictation was prepared with Dragon dictation along with smaller phrase technology. Any transcriptional errors that result from this process are unintentional.          Renford Dills, NP 04/01/17 [phone]

## 2017-04-03 DIAGNOSIS — F411 Generalized anxiety disorder: Secondary | ICD-10-CM | POA: Insufficient documentation

## 2017-04-04 LAB — CULTURE, GROUP A STREP (THRC)

## 2017-05-05 ENCOUNTER — Encounter: Payer: Self-pay | Admitting: Emergency Medicine

## 2017-05-05 ENCOUNTER — Ambulatory Visit
Admission: EM | Admit: 2017-05-05 | Discharge: 2017-05-05 | Disposition: A | Discharge status: Home / Self Care | Payer: BLUE CROSS/BLUE SHIELD | Attending: Family Medicine | Admitting: Family Medicine

## 2017-05-05 DIAGNOSIS — L245 Irritant contact dermatitis due to other chemical products: Secondary | ICD-10-CM | POA: Diagnosis not present

## 2017-05-05 MED ORDER — PREDNISONE 10 MG PO TABS: ORAL_TABLET | ORAL | 0 refills | Status: DC

## 2017-05-05 NOTE — Discharge Instructions (Signed)
Steroids as prescribed.  Take care  Dr. Libbi Towner  

## 2017-05-05 NOTE — ED Provider Notes (Signed)
[facility]    CSN: [id number] Arrival date & time: 05/05/17  1206     History   Chief Complaint Chief Complaint  Patient presents with  . Rash   HPI 31 year old female presents with rash.  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  She states that it leaked and landed on her gloves.  She states that it penetrated through her gloves.  Since that period of time she has had rash, swelling of the dorsum of both hands.  She states that it is painful and burns.  Moderate in severity. She has been using hydrocortisone with no improvement.  She states that it seems to be getting worse.  She has some areas of blistering.  She states that [employer] is not involved as it is a "allergic reaction".  She is due to return to work tonight and does not feel she can do so as she has to wear gloves.  No other associated symptoms.  No other complaints at this time.  PMH - fibromyalgia.  Past Surgical History:  Procedure Laterality Date  . NO PAST SURGERIES     OB History    No data available     Home Medications    Prior to Admission medications   Medication Sig Start Date End Date Taking? Authorizing Provider  amitriptyline (ELAVIL) 10 MG tablet Take 10 mg by mouth at bedtime.   Yes [provider]  ibuprofen (ADVIL,MOTRIN) 800 MG tablet Take 1 tablet (800 mg total) by mouth every 8 (eight) hours as needed for mild pain or moderate pain. 01/02/17   Renford DillsMiller, Lindsey, NP  levonorgestrel (MIRENA) 20 MCG/24HR IUD 1 each by Intrauterine route once.    [provider]  predniSONE (DELTASONE) 10 MG tablet 5 tablets daily x 3 days, then 4 tablets daily x 3 days, then 3 tablets daily x 3 days, then 2 tablets daily x 3 days, then 1 tablet daily x 3 days. 05/05/17   Tommie Samsook, Saina Waage G, DO   Family History History reviewed. No pertinent family history.  Social History Social History  Substance Use Topics  .  Smoking status: Never Smoker  . Smokeless tobacco: Never Used  . Alcohol use No   Allergies   Coconut flavor and Azithromycin  Review of Systems Review of Systems  Constitutional: Negative.   Musculoskeletal:       Swelling of the dorsum of the hands.  Skin: Positive for rash.    Physical Exam Triage Vital Signs ED Triage Vitals  Enc Vitals Group     BP 05/05/17 1227 121/80     Pulse Rate 05/05/17 1227 80     Resp 05/05/17 1227 16     Temp 05/05/17 1227 98.2 F (36.8 C)     Temp Source 05/05/17 1227 Oral     SpO2 05/05/17 1227 100 %     Weight 05/05/17 1224 237 lb (107.5 kg)     Height 05/05/17 1224 5\' 4"  (1.626 m)     Head Circumference --      Peak Flow --      Pain Score 05/05/17 1224 8     Pain Loc --      Pain Edu? --      Excl. in GC? --    No data found.   Updated Vital Signs BP 121/80 (BP Location: Right Arm)   Pulse 80   Temp 98.2 F (36.8 C) (  Oral)   Resp 16   Ht 5\' 4"  (1.626 m)   Wt 237 lb (107.5 kg)   SpO2 100%   BMI 40.68 kg/m   Physical Exam  Constitutional: She is oriented to person, place, and time. She appears well-developed. No distress.  HENT:  Head: Normocephalic and atraumatic.  Pulmonary/Chest: Effort normal. No respiratory distress.  Musculoskeletal: Normal range of motion.  Neurological: She is alert and oriented to person, place, and time.  Skin:  Hands - dorsum of the hands with papular rash. Interdigital areas with blistering. + Erythema.   Psychiatric: She has a normal mood and affect. Her behavior is normal.  Vitals reviewed.  UC Treatments / Results  Labs (all labs ordered are listed, but only abnormal results are displayed) Labs Reviewed - No data to display  EKG  EKG Interpretation None       Radiology No results found.  Procedures Procedures (including critical care time)  Medications Ordered in UC Medications - No data to display   Initial Impression / Assessment and Plan / UC Course  I have  reviewed the triage vital signs and the nursing notes.  Pertinent labs & imaging results that were available during my care of the patient were reviewed by me and considered in my medical decision making (see chart for details).     31 year old female presents with dermatitis.  Secondary to irritant/chemical.  Her rash is quite severe.  Treated with a prednisone taper.  Work note given.  Final Clinical Impressions(s) / UC Diagnoses   Final diagnoses:  Irritant contact dermatitis due to other chemical products    New Prescriptions New Prescriptions   PREDNISONE (DELTASONE) 10 MG TABLET    5 tablets daily x 3 days, then 4 tablets daily x 3 days, then 3 tablets daily x 3 days, then 2 tablets daily x 3 days, then 1 tablet daily x 3 days.    Controlled Substance Prescriptions [place] Controlled Substance Registry consulted? Not Applicable   Tommie Sams, DO 05/05/17 1327

## 2017-05-05 NOTE — ED Triage Notes (Signed)
Patient states that she was at work a week ago and that she was wearing gloves that day and she states that when she pulled the gloves off she had swelling, redness and itching in both hands.

## 2017-06-13 DIAGNOSIS — Z87442 Personal history of urinary calculi: Secondary | ICD-10-CM

## 2017-06-13 HISTORY — DX: Personal history of urinary calculi: Z87.442

## 2017-06-28 ENCOUNTER — Emergency Department: Payer: BLUE CROSS/BLUE SHIELD

## 2017-06-28 ENCOUNTER — Emergency Department
Admission: EM | Admit: 2017-06-28 | Discharge: 2017-06-28 | Disposition: A | Discharge status: Home / Self Care | Payer: BLUE CROSS/BLUE SHIELD | Attending: Emergency Medicine | Admitting: Emergency Medicine

## 2017-06-28 ENCOUNTER — Other Ambulatory Visit: Payer: Self-pay

## 2017-06-28 ENCOUNTER — Encounter: Payer: Self-pay | Admitting: Emergency Medicine

## 2017-06-28 DIAGNOSIS — R102 Pelvic and perineal pain: Secondary | ICD-10-CM | POA: Insufficient documentation

## 2017-06-28 DIAGNOSIS — N83201 Unspecified ovarian cyst, right side: Secondary | ICD-10-CM | POA: Diagnosis not present

## 2017-06-28 DIAGNOSIS — Z79899 Other long term (current) drug therapy: Secondary | ICD-10-CM | POA: Diagnosis not present

## 2017-06-28 LAB — CBC WITH DIFFERENTIAL/PLATELET
BASOS PCT: 0 %
Basophils Absolute: 0 10*3/uL (ref 0–0.1)
Eosinophils Absolute: 0.1 10*3/uL (ref 0–0.7)
Eosinophils Relative: 1 %
HEMATOCRIT: 40.8 % (ref 35.0–47.0)
HEMOGLOBIN: 13.6 g/dL (ref 12.0–16.0)
LYMPHS ABS: 2 10*3/uL (ref 1.0–3.6)
LYMPHS PCT: 21 %
MCH: 28.3 pg (ref 26.0–34.0)
MCHC: 33.4 g/dL (ref 32.0–36.0)
MCV: 85 fL (ref 80.0–100.0)
MONO ABS: 0.5 10*3/uL (ref 0.2–0.9)
MONOS PCT: 6 %
NEUTROS ABS: 6.6 10*3/uL — AB (ref 1.4–6.5)
NEUTROS PCT: 72 %
Platelets: 323 10*3/uL (ref 150–440)
RBC: 4.8 MIL/uL (ref 3.80–5.20)
RDW: 14 % (ref 11.5–14.5)
WBC: 9.2 10*3/uL (ref 3.6–11.0)

## 2017-06-28 LAB — URINALYSIS, ROUTINE W REFLEX MICROSCOPIC
BILIRUBIN URINE: NEGATIVE
Glucose, UA: NEGATIVE mg/dL
KETONES UR: NEGATIVE mg/dL
Leukocytes, UA: NEGATIVE
Nitrite: NEGATIVE
PH: 5 (ref 5.0–8.0)
Protein, ur: NEGATIVE mg/dL
SPECIFIC GRAVITY, URINE: 1.026 (ref 1.005–1.030)

## 2017-06-28 LAB — COMPREHENSIVE METABOLIC PANEL
ALBUMIN: 3.9 g/dL (ref 3.5–5.0)
ALT: 13 U/L — ABNORMAL LOW (ref 14–54)
ANION GAP: 7 (ref 5–15)
AST: 15 U/L (ref 15–41)
Alkaline Phosphatase: 87 U/L (ref 38–126)
BILIRUBIN TOTAL: 0.6 mg/dL (ref 0.3–1.2)
BUN: 16 mg/dL (ref 6–20)
CHLORIDE: 107 mmol/L (ref 101–111)
CO2: 25 mmol/L (ref 22–32)
Calcium: 9.1 mg/dL (ref 8.9–10.3)
Creatinine, Ser: 0.85 mg/dL (ref 0.44–1.00)
GFR calc Af Amer: >60 mL/min (ref >60)
GFR calc non Af Amer: >60 mL/min (ref >60)
GLUCOSE: 95 mg/dL (ref 65–99)
POTASSIUM: 3.6 mmol/L (ref 3.5–5.1)
SODIUM: 139 mmol/L (ref 135–145)
TOTAL PROTEIN: 7.4 g/dL (ref 6.5–8.1)

## 2017-06-28 LAB — CHLAMYDIA/NGC RT PCR (ARMC ONLY)
CHLAMYDIA TR: NOT DETECTED
N gonorrhoeae: NOT DETECTED

## 2017-06-28 LAB — LIPASE, BLOOD: Lipase: 21 U/L (ref 11–51)

## 2017-06-28 LAB — POCT PREGNANCY, URINE: PREG TEST UR: NEGATIVE

## 2017-06-28 LAB — WET PREP, GENITAL
CLUE CELLS WET PREP: NONE SEEN
Sperm: NONE SEEN
TRICH WET PREP: NONE SEEN
Yeast Wet Prep HPF POC: NONE SEEN

## 2017-06-28 IMAGING — CT CT ABD-PELV W/ CM
2 of 4 series · 16 of 46 positions shown, 18 images · IV contrast (APPLIED)
Comparison: None.

CLINICAL DATA: Lower abdominal/ pelvic pain

EXAM:
CT ABDOMEN AND PELVIS WITH CONTRAST
TECHNIQUE: Multidetector CT imaging of the abdomen and pelvis was performed
using the standard protocol following bolus administration of
intravenous contrast.
CONTRAST:  125mL [S2] IOPAMIDOL ([S2]) INJECTION 61%

[Series 2: routine abd/pel with · axial · 0.81mm/px · z∈[-543,-93]mm · 13 of 100 slices shown, 15 images]
[im 5/100  soft-tissue]
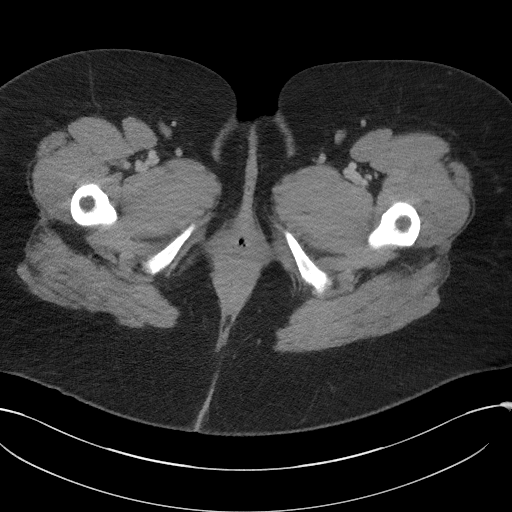
[im 5/100  bone]
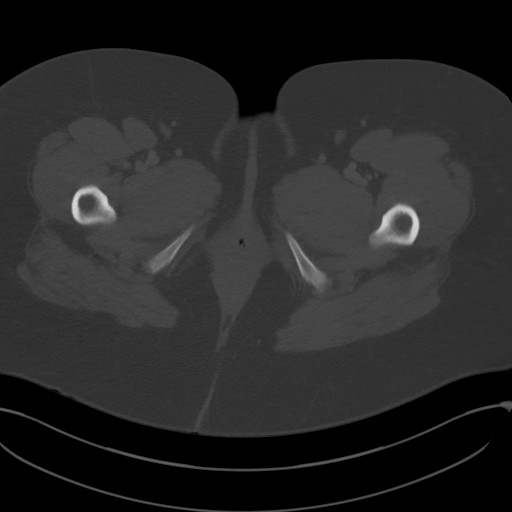
[im 13/100  soft-tissue]
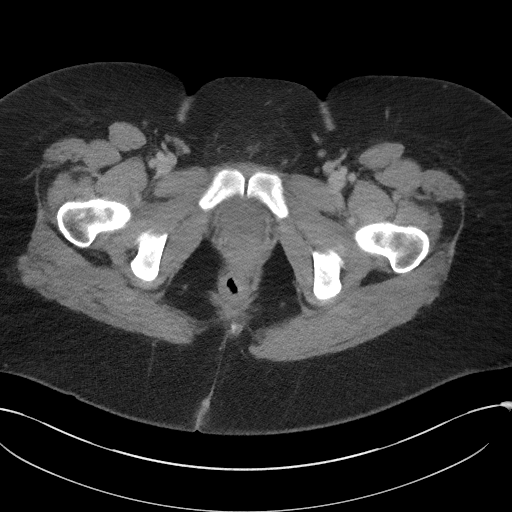
[im 21/100  soft-tissue]
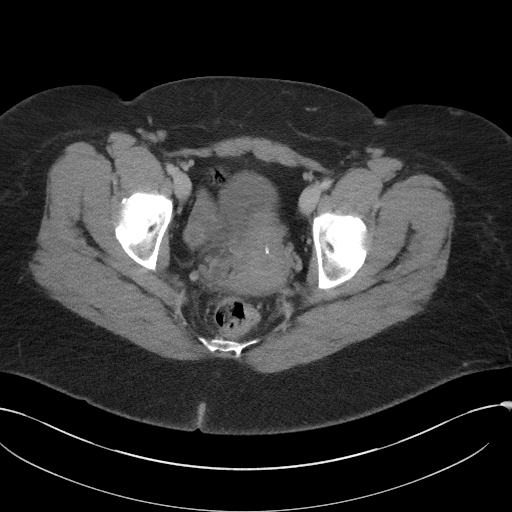
[im 29/100  soft-tissue]
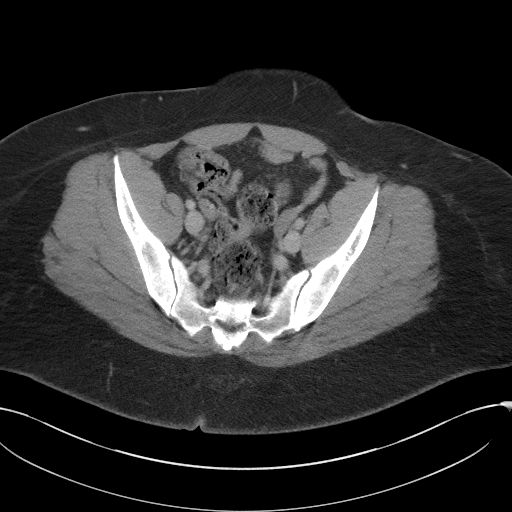
[im 34/100  soft-tissue]
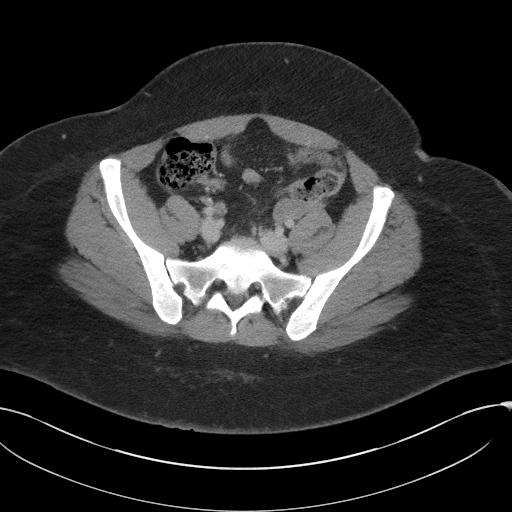
[im 42/100  soft-tissue]
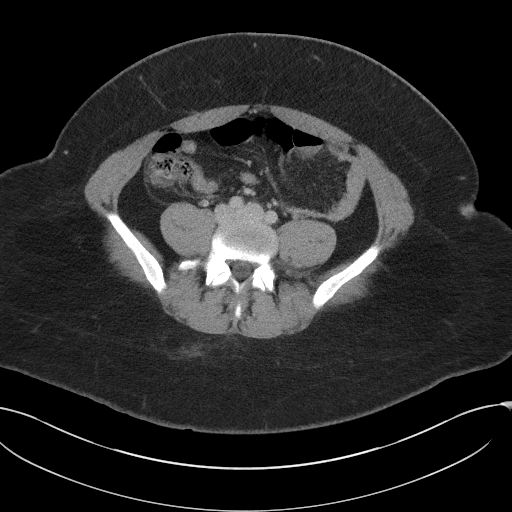
[im 50/100  soft-tissue]
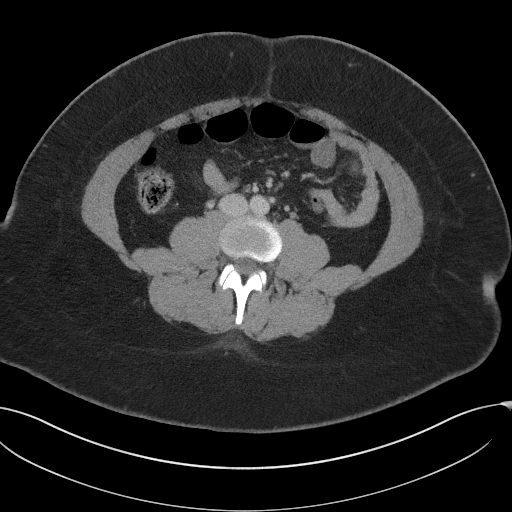
[im 58/100  soft-tissue]
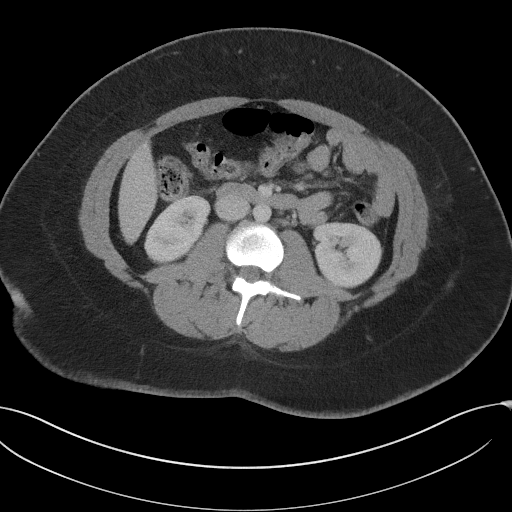
[im 67/100  soft-tissue]
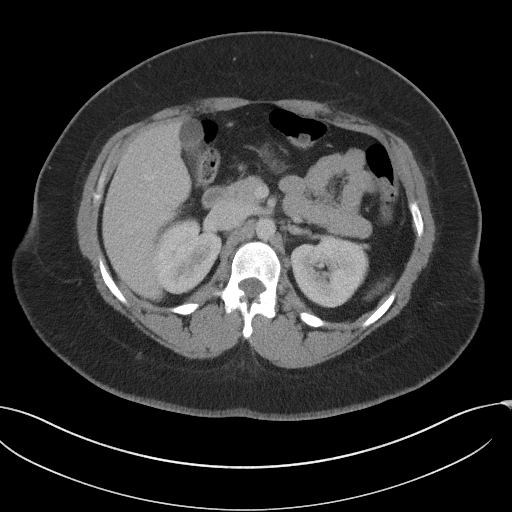
[im 67/100  bone]
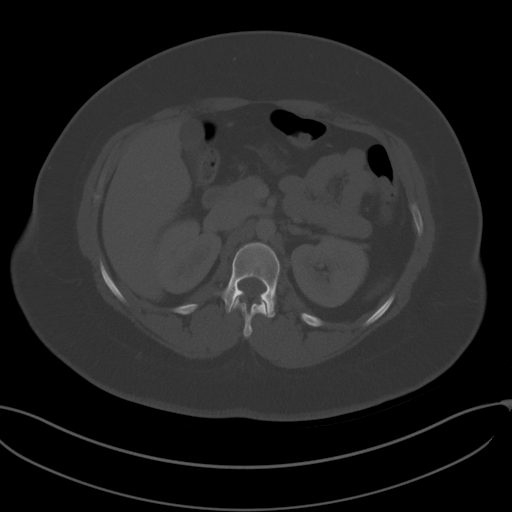
[im 71/100  soft-tissue]
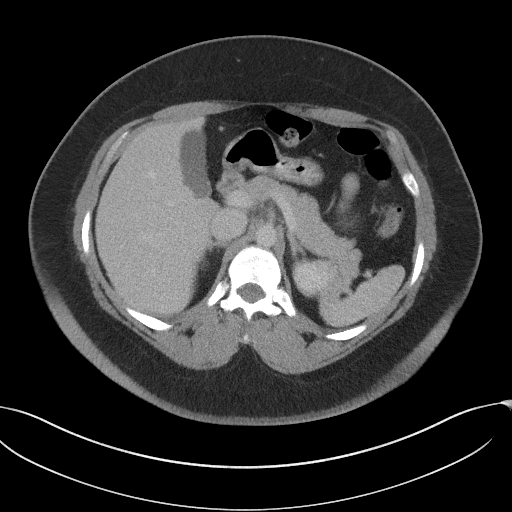
[im 79/100  soft-tissue]
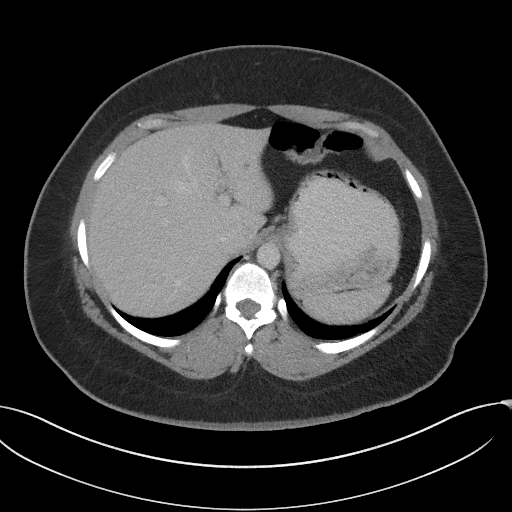
[im 87/100  soft-tissue]
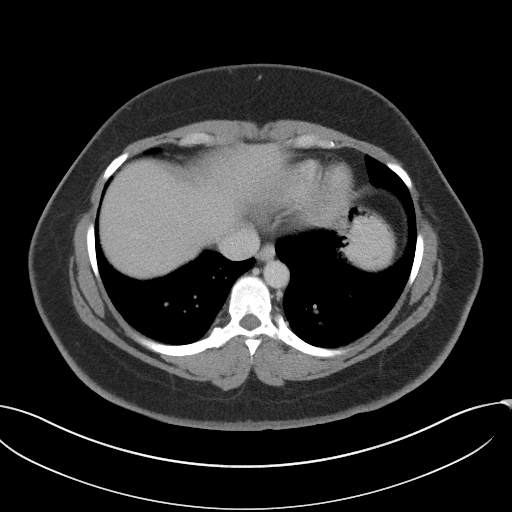
[im 95/100  soft-tissue]
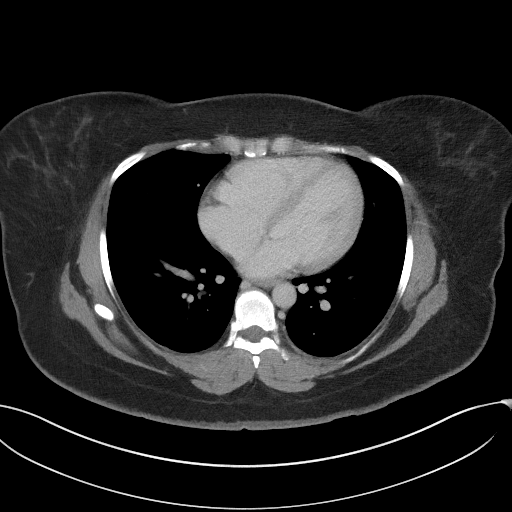

[Series 5: coronal st · coronal · 0.81mm/px · 3 of 105 slices shown]
[im 35/105  soft-tissue]
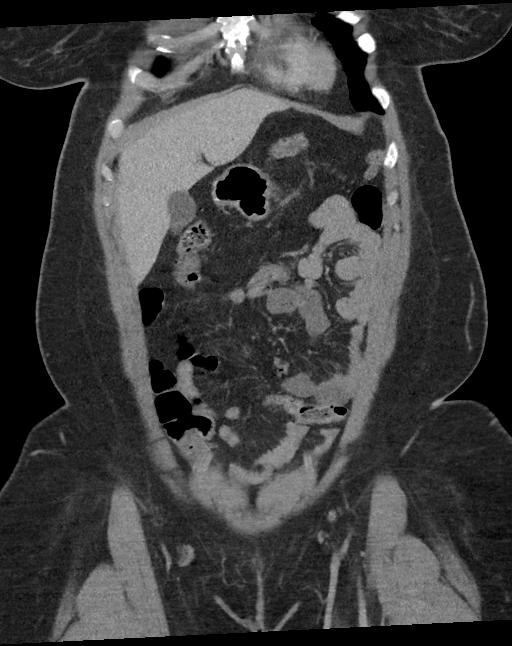
[im 47/105  soft-tissue]
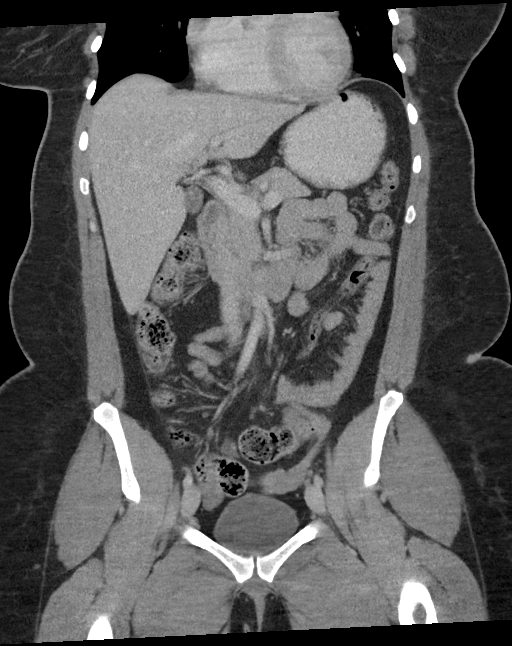
[im 58/105  soft-tissue]
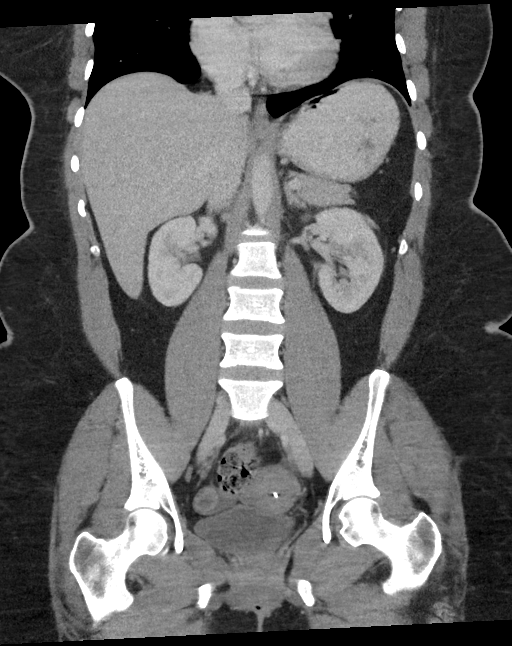

[16 of 46 positions shown; findings below may reference images not displayed]

FINDINGS: Lower chest: There is posterior left base atelectasis. Lung bases
otherwise are clear.

Hepatobiliary: No focal liver lesions are evident. Gallbladder wall
is not appreciably thickened. There is no biliary duct dilatation.

Pancreas: There is no pancreatic mass or inflammatory focus.

Spleen: No splenic lesions are evident.

Adrenals/Urinary Tract: Adrenals bilaterally appear normal. Kidneys
bilaterally show no evident mass or hydronephrosis on either side.
There is no renal or ureteral calculus on either side. Urinary
bladder is midline with wall thickness within normal limits.

Stomach/Bowel: There is no appreciable bowel wall or mesenteric
thickening. No evident bowel obstruction. No free air or portal
venous air.

Vascular/Lymphatic: There is no appreciable abdominal aortic
aneurysm. No vascular lesions are evident. Major mesenteric vessels
appear unremarkable.

Reproductive: Uterus is anteverted. There is an intrauterine device
within the endometrium. There is a small right ovarian cyst
measuring 1.7 x 1.6 cm. No other pelvic mass evident.

Other: Appendix appears normal. There is no abscess or ascites in
the abdomen or pelvis. There is a small ventral hernia containing
only fat.

Musculoskeletal: There are no blastic or lytic bone lesions. No
intramuscular or abdominal wall lesion evident.
IMPRESSION: 1.  Appendix appears normal.

2. Probable dominant follicle right ovary. No appreciable pelvic
fluid. Intrauterine device within the endometrium.

3.  No bowel obstruction.  No abscess.

4.  No renal or ureteral calculus.  No hydronephrosis.

5.  Small ventral hernia containing only fat.

## 2017-06-28 MED ORDER — PROMETHAZINE HCL 25 MG/ML IJ SOLN
INTRAMUSCULAR | Status: AC
  Filled 2017-06-28: qty 1

## 2017-06-28 MED ORDER — IOPAMIDOL (ISOVUE-300) INJECTION 61%
30.0000 mL | Freq: Once | INTRAVENOUS | Status: AC
  Administered 2017-06-28: 30 mL via ORAL
  Filled 2017-06-28: qty 30

## 2017-06-28 MED ORDER — MORPHINE SULFATE (PF) 4 MG/ML IV SOLN
4.0000 mg | Freq: Once | INTRAVENOUS | Status: AC
  Administered 2017-06-28: 4 mg via INTRAVENOUS
  Filled 2017-06-28: qty 1

## 2017-06-28 MED ORDER — ONDANSETRON HCL 4 MG/2ML IJ SOLN
INTRAMUSCULAR | Status: AC
  Filled 2017-06-28: qty 2

## 2017-06-28 MED ORDER — IOPAMIDOL (ISOVUE-300) INJECTION 61%
125.0000 mL | Freq: Once | INTRAVENOUS | Status: AC | PRN
  Administered 2017-06-28: 125 mL via INTRAVENOUS
  Filled 2017-06-28: qty 150

## 2017-06-28 MED ORDER — OXYCODONE-ACETAMINOPHEN 5-325 MG PO TABS: 1.0000 | ORAL_TABLET | Freq: Four times a day (QID) | ORAL | 0 refills | Status: DC | PRN

## 2017-06-28 MED ORDER — ONDANSETRON HCL 4 MG/2ML IJ SOLN
4.0000 mg | Freq: Once | INTRAMUSCULAR | Status: AC
  Administered 2017-06-28: 4 mg via INTRAVENOUS

## 2017-06-28 MED ORDER — PROMETHAZINE HCL 25 MG/ML IJ SOLN
12.5000 mg | Freq: Once | INTRAMUSCULAR | Status: AC
Start: 2017-06-28 — End: 2017-06-28
  Administered 2017-06-28: 12.5 mg via INTRAVENOUS

## 2017-06-28 MED ORDER — ONDANSETRON HCL 4 MG PO TABS: 4.0000 mg | ORAL_TABLET | Freq: Three times a day (TID) | ORAL | 0 refills | Status: DC | PRN

## 2017-06-28 MED ORDER — SODIUM CHLORIDE 0.9 % IV SOLN
Freq: Once | INTRAVENOUS | Status: AC
  Administered 2017-06-28: 20:00:00 via INTRAVENOUS

## 2017-06-28 MED ORDER — HYDROMORPHONE HCL 1 MG/ML IJ SOLN
1.0000 mg | Freq: Once | INTRAMUSCULAR | Status: AC
  Administered 2017-06-28: 1 mg via INTRAVENOUS
  Filled 2017-06-28: qty 1

## 2017-06-28 NOTE — ED Notes (Signed)
Pt using call bell; feeling nauseated after drinking about 1/4th of contrast bottle; given emesis bag; verbal order from Dr Alphonzo LemmingsMcShane for Zofran IV

## 2017-06-28 NOTE — ED Notes (Signed)
CT tech at bedside to start pt on oral contrast

## 2017-06-28 NOTE — ED Triage Notes (Signed)
Pt presents to ED c/o pelvic pain. Pt was seen by OB/GYN 06/24/17 for pre-screening for upcoming tubal ligation and had pelvic exam at that time. Pt states that since that pelvic exam she has had increasing pelvic pain. Pt state she was also told she has a yeast infection and given diflucan. PMH chlamydia that has been treated. Has Mirena IUD that is 31 years old.

## 2017-06-28 NOTE — ED Notes (Signed)
Pt used call bell to report increased pain to bilateral lower abd; rates 9/10; tearful; reports nausea as well; spoke with MD regarding pt's pain complaint

## 2017-06-28 NOTE — ED Notes (Signed)
Pt using call bell; vomited up clear liquid; pt says not feeling any better after vomiting; will discuss with MD when available;

## 2017-06-28 NOTE — ED Notes (Signed)
MD aware pt is unable to tolerate oral contrast; gave OK for pt to go ahead and have scan with just IV contrast; pt aware; radiology tech walking down hallway made aware of MD decision and is taking pt to radiology

## 2017-06-28 NOTE — ED Notes (Signed)
Returned to room from CT. 

## 2017-06-28 NOTE — ED Notes (Signed)
Dr Alphonzo LemmingsMcShane in to follow up with pt

## 2017-06-28 NOTE — ED Provider Notes (Addendum)
[facility] Emergency Department Provider Note  ____________________________________________   I have reviewed the triage vital signs and the nursing notes. Where available I have reviewed prior notes and, if possible and indicated, outside hospital notes.    HISTORY  Chief Complaint Pelvic Pain    HPI Toni Robertson is a 31 y.o. female who presents today complaining of right pelvic pain which began suddenly 3 days ago.  Patient does have a history of irregular periods has not had a period since September.  She was seen the day before this pain started for a pelvic exam by her OB/GYN, and the next day she had sudden onset pelvic pain.  She denies any fever vomiting or diarrhea.  The pain is sharp and persistent.  It was at maximum intensity at onset.  She has not had pain like this before no history of ovarian cyst.  She denies pregnancy.  Her pain is sharp, nonradiating, significant.  Seems to wax and wane but is never gone.  No other associated symptoms no other prior treatment.  He does have an IUD and she will be getting the IUD removed shortly, her evaluation with her PCP/OB was to evaluate for BTL.  Occasion she will need to obtain a ride home.   History reviewed. No pertinent past medical history.  There are no active problems to display for this patient.   Past Surgical History:  Procedure Laterality Date  . NO PAST SURGERIES      Prior to Admission medications   Medication Sig Start Date End Date Taking? Authorizing Provider  amitriptyline (ELAVIL) 10 MG tablet Take 10 mg by mouth at bedtime.    [provider]  ibuprofen (ADVIL,MOTRIN) 800 MG tablet Take 1 tablet (800 mg total) by mouth every 8 (eight) hours as needed for mild pain or moderate pain. 01/02/17   Renford Dills, NP  levonorgestrel (MIRENA) 20 MCG/24HR IUD 1 each by Intrauterine route once.    [provider]  predniSONE (DELTASONE) 10 MG tablet 5 tablets daily x 3  days, then 4 tablets daily x 3 days, then 3 tablets daily x 3 days, then 2 tablets daily x 3 days, then 1 tablet daily x 3 days. 05/05/17   Tommie Sams, DO    Allergies Coconut flavor and Azithromycin  History reviewed. No pertinent family history.  Social History Social History   Tobacco Use  . Smoking status: Never Smoker  . Smokeless tobacco: Never Used  Substance Use Topics  . Alcohol use: No  . Drug use: No    Review of Systems Constitutional: No fever/chills Eyes: No visual changes. ENT: No sore throat. No stiff neck no neck pain Cardiovascular: Denies chest pain. Respiratory: Denies shortness of breath. Gastrointestinal:   no vomiting.  No diarrhea.  No constipation. Genitourinary: Negative for dysuria. Musculoskeletal: Negative lower extremity swelling Skin: Negative for rash. Neurological: Negative for severe headaches, focal weakness or numbness.   ____________________________________________   PHYSICAL EXAM:  VITAL SIGNS: ED Triage Vitals  Enc Vitals Group     BP 06/28/17 1547 (!) 154/92     Pulse Rate 06/28/17 1547 93     Resp 06/28/17 1547 18     Temp 06/28/17 1547 98.4 F (36.9 C)     Temp Source 06/28/17 1547 Oral     SpO2 06/28/17 1547 99 %     Weight 06/28/17 1547 285 lb (129.3 kg)     Height 06/28/17 1547 5\' 4"  (1.626 m)  Head Circumference --      Peak Flow --      Pain Score 06/28/17 1602 9     Pain Loc --      Pain Edu? --      Excl. in GC? --     Constitutional: Alert and oriented. Well appearing and in no acute distress. Eyes: Conjunctivae are normal Head: Atraumatic HEENT: No congestion/rhinnorhea. Mucous membranes are moist.  Oropharynx non-erythematous Neck:   Nontender with no meningismus, no masses, no stridor Cardiovascular: Normal rate, regular rhythm. Grossly normal heart sounds.  Good peripheral circulation. Respiratory: Normal respiratory effort.  No retractions. Lungs CTAB. Abdominal: Soft and nontender. No  distention. No guarding no rebound Back:  There is no focal tenderness or step off.  there is no midline tenderness there are no lesions noted. there is no CVA tenderness Pelvic exam: Female nurse chaperone present, no external lesions noted, physiologic vaginal discharge noted with no purulent discharge, no cervical motion tenderness, right adnexal tenderness no mass, there is no significant uterine tenderness or mass. No vaginal bleeding, there is intact vaginal string in place for IUD Musculoskeletal: No lower extremity tenderness, no upper extremity tenderness. No joint effusions, no DVT signs strong distal pulses no edema Neurologic:  Normal speech and language. No gross focal neurologic deficits are appreciated.  Skin:  Skin is warm, dry and intact. No rash noted. Psychiatric: Mood and affect are normal. Speech and behavior are normal.  ____________________________________________   LABS (all labs ordered are listed, but only abnormal results are displayed)  Labs Reviewed  WET PREP, GENITAL - Abnormal; Notable for the following components:      Result Value   WBC, Wet Prep HPF POC MANY (*)    All other components within normal limits  URINALYSIS, ROUTINE W REFLEX MICROSCOPIC - Abnormal; Notable for the following components:   Color, Urine YELLOW (*)    APPearance HAZY (*)    Hgb urine dipstick MODERATE (*)    Bacteria, UA RARE (*)    Squamous Epithelial / LPF 6-30 (*)    All other components within normal limits  COMPREHENSIVE METABOLIC PANEL - Abnormal; Notable for the following components:   ALT 13 (*)    All other components within normal limits  CBC WITH DIFFERENTIAL/PLATELET - Abnormal; Notable for the following components:   Neutro Abs 6.6 (*)    All other components within normal limits  CHLAMYDIA/NGC RT PCR ([facility] ONLY)  LIPASE, BLOOD  POCT PREGNANCY, URINE  POC URINE PREG, ED    Pertinent labs  results that were available during my care of the patient were  reviewed by me and considered in my medical decision making (see chart for details). ____________________________________________  EKG  I personally interpreted any EKGs ordered by me or triage  ____________________________________________  RADIOLOGY  Pertinent labs & imaging results that were available during my care of the patient were reviewed by me and considered in my medical decision making (see chart for details). If possible, patient and/or family made aware of any abnormal findings.  No results found. ____________________________________________    PROCEDURES  Procedure(s) performed: None  Procedures  Critical Care performed: None  ____________________________________________   INITIAL IMPRESSION / ASSESSMENT AND PLAN / ED COURSE  Pertinent labs & imaging results that were available during my care of the patient were reviewed by me and considered in my medical decision making (see chart for details).  Here with sudden onset right flank pain after pelvic exam, low suspicion for appendicitis  she has had this for 3 days with no fever noted and no white count, she has pain in the adnexa, after pelvic exam.  Certainly could be ovarian cyst, patient has a somewhat large body habitus and has hirsutism in the face.  Leading one to suspect the possibility of PCO S although she has never knowingly had a ovarian cyst.  No evidence at this time that the IUD is in the wrong place, IUD perforation is possible but again not thought likely, PID is possible but not likely, we will send cultures, giving her pain medication she understands she must not drive after these pain medications, and we will evaluate with ultrasound as a starting point.  ----------------------------------------- 7:30 PM on 06/28/2017 -----------------------------------------  Reassuring exam reassuring vital signs reassuring patient continues to complain of agonizing discomfort however.  We are not seeing much in  the way of objective correlative to this but we will keep looking I will continue to give her narcotic pain medication pending further diagnostic evaluation of her pelvic pain.  Ultrasound is pending.  ----------------------------------------- 9:08 PM on 06/28/2017 -----------------------------------------  Has had symptoms for 3 days but no vomiting until after she got Dilaudid.  She did have one episode of vomiting here we will give her antiemetics prior to discharge I did discuss with Dr. Dalbert GarnetBeasley, patient is nearly 300 pounds but she has a completely benign abdomen at this time.  We do notice an ovarian cyst with no evidence of likely torsion seen on ultrasound.  Dr. Dalbert GarnetBeasley and I discussed all of these findings, she does not feel further imaging or intervention is indicated nor his family.  We will see if we can get her nausea under control which again did not startle after the Dilaudid.  Her pain is much better controlled.  Patient does have fibromyalgia and chronic recurrent discomforts of various varieties however we are very reassured by her workup here.  Considering the patient's symptoms, medical history, and physical examination today, I have low suspicion for cholecystitis or biliary pathology, pancreatitis, perforation or bowel obstruction, hernia, intra-abdominal abscess, AAA or dissection, volvulus or intussusception, mesenteric ischemia, ischemic gut, pyelonephritis or appendicitis.  Is also interested in getting a work note for the next few days which will provide   ____________________________________________   FINAL CLINICAL IMPRESSION(S) / ED DIAGNOSES  Final diagnoses:  Pelvic pain      This chart was dictated using voice recognition software.  Despite best efforts to proofread,  errors can occur which can change meaning.      Jeanmarie PlantMcShane, James A, MD 06/28/17 1815    Jeanmarie PlantMcShane, James A, MD 06/28/17 [id number]    Jeanmarie PlantMcShane, James A, MD 06/28/17 1932    Jeanmarie PlantMcShane, James A,  MD 06/28/17 2109

## 2017-06-29 ENCOUNTER — Encounter: Payer: Self-pay | Admitting: Intensive Care

## 2017-06-29 ENCOUNTER — Emergency Department
Admission: EM | Admit: 2017-06-29 | Discharge: 2017-06-29 | Disposition: A | Discharge status: Home / Self Care | Payer: BLUE CROSS/BLUE SHIELD | Attending: Emergency Medicine | Admitting: Emergency Medicine

## 2017-06-29 DIAGNOSIS — R1084 Generalized abdominal pain: Secondary | ICD-10-CM | POA: Diagnosis not present

## 2017-06-29 DIAGNOSIS — Z79899 Other long term (current) drug therapy: Secondary | ICD-10-CM | POA: Insufficient documentation

## 2017-06-29 DIAGNOSIS — R319 Hematuria, unspecified: Secondary | ICD-10-CM | POA: Diagnosis not present

## 2017-06-29 DIAGNOSIS — R1030 Lower abdominal pain, unspecified: Secondary | ICD-10-CM | POA: Diagnosis present

## 2017-06-29 HISTORY — DX: Fibromyalgia: M79.7

## 2017-06-29 LAB — URINALYSIS, COMPLETE (UACMP) WITH MICROSCOPIC
BILIRUBIN URINE: NEGATIVE
Bacteria, UA: NONE SEEN
GLUCOSE, UA: NEGATIVE mg/dL
KETONES UR: 5 mg/dL — AB
LEUKOCYTES UA: NEGATIVE
NITRITE: NEGATIVE
PH: 5 (ref 5.0–8.0)
Protein, ur: NEGATIVE mg/dL
SPECIFIC GRAVITY, URINE: 1.029 (ref 1.005–1.030)

## 2017-06-29 LAB — CBC WITH DIFFERENTIAL/PLATELET
Basophils Absolute: 0 10*3/uL (ref 0–0.1)
Basophils Relative: 0 %
EOS PCT: 1 %
Eosinophils Absolute: 0.1 10*3/uL (ref 0–0.7)
HEMATOCRIT: 40.4 % (ref 35.0–47.0)
Hemoglobin: 13.4 g/dL (ref 12.0–16.0)
LYMPHS PCT: 19 %
Lymphs Abs: 1.9 10*3/uL (ref 1.0–3.6)
MCH: 28.3 pg (ref 26.0–34.0)
MCHC: 33 g/dL (ref 32.0–36.0)
MCV: 85.6 fL (ref 80.0–100.0)
Monocytes Absolute: 0.6 10*3/uL (ref 0.2–0.9)
Monocytes Relative: 6 %
NEUTROS PCT: 74 %
Neutro Abs: 7.8 10*3/uL — ABNORMAL HIGH (ref 1.4–6.5)
Platelets: 345 10*3/uL (ref 150–440)
RBC: 4.72 MIL/uL (ref 3.80–5.20)
RDW: 14.4 % (ref 11.5–14.5)
WBC: 10.4 10*3/uL (ref 3.6–11.0)

## 2017-06-29 LAB — COMPREHENSIVE METABOLIC PANEL
ALBUMIN: 4.1 g/dL (ref 3.5–5.0)
ALT: 13 U/L — ABNORMAL LOW (ref 14–54)
ANION GAP: 9 (ref 5–15)
AST: 15 U/L (ref 15–41)
Alkaline Phosphatase: 88 U/L (ref 38–126)
BILIRUBIN TOTAL: 0.5 mg/dL (ref 0.3–1.2)
BUN: 13 mg/dL (ref 6–20)
CALCIUM: 8.8 mg/dL — AB (ref 8.9–10.3)
CO2: 23 mmol/L (ref 22–32)
Chloride: 106 mmol/L (ref 101–111)
Creatinine, Ser: 0.94 mg/dL (ref 0.44–1.00)
GFR calc non Af Amer: >60 mL/min (ref >60)
GLUCOSE: 103 mg/dL — AB (ref 65–99)
POTASSIUM: 4.4 mmol/L (ref 3.5–5.1)
Sodium: 138 mmol/L (ref 135–145)
TOTAL PROTEIN: 7.5 g/dL (ref 6.5–8.1)

## 2017-06-29 LAB — HCG, QUANTITATIVE, PREGNANCY: hCG, Beta Chain, Quant, S: <1 m[IU]/mL (ref <5)

## 2017-06-29 LAB — TROPONIN I: Troponin I: <0.03 ng/mL (ref <0.03)

## 2017-06-29 MED ORDER — ONDANSETRON HCL 4 MG/2ML IJ SOLN
4.0000 mg | Freq: Once | INTRAMUSCULAR | Status: AC
  Administered 2017-06-29: 4 mg via INTRAVENOUS
  Filled 2017-06-29: qty 2

## 2017-06-29 MED ORDER — OXYCODONE HCL 5 MG PO TABS
ORAL_TABLET | ORAL | Status: AC
  Filled 2017-06-29: qty 1

## 2017-06-29 MED ORDER — KETOROLAC TROMETHAMINE 30 MG/ML IJ SOLN
30.0000 mg | Freq: Once | INTRAMUSCULAR | Status: AC
  Administered 2017-06-29: 30 mg via INTRAVENOUS
  Filled 2017-06-29: qty 1

## 2017-06-29 MED ORDER — ACETAMINOPHEN 500 MG PO TABS
1000.0000 mg | ORAL_TABLET | Freq: Once | ORAL | Status: AC
  Administered 2017-06-29: 1000 mg via ORAL
  Filled 2017-06-29: qty 2

## 2017-06-29 MED ORDER — ONDANSETRON HCL 4 MG/2ML IJ SOLN
INTRAMUSCULAR | Status: AC
  Filled 2017-06-29: qty 2

## 2017-06-29 MED ORDER — IBUPROFEN 800 MG PO TABS: 800.0000 mg | ORAL_TABLET | Freq: Three times a day (TID) | ORAL | 0 refills | Status: DC | PRN

## 2017-06-29 MED ORDER — ONDANSETRON HCL 4 MG/2ML IJ SOLN
4.0000 mg | Freq: Once | INTRAMUSCULAR | Status: AC
  Administered 2017-06-29: 4 mg via INTRAVENOUS

## 2017-06-29 MED ORDER — OXYCODONE HCL 5 MG PO TABS
5.0000 mg | ORAL_TABLET | Freq: Once | ORAL | Status: AC
  Administered 2017-06-29: 5 mg via ORAL

## 2017-06-29 NOTE — H&P (Signed)
[name] is a 31 y.o. female here for elective L/S BTL   Pt here for consultation for elective sterilization . She is G1p1 svd , currently engaged . Recently chlamydia infection with treatment . Using Mirena  Concerned about vaginal odor and weight gain now on Cymbalta for fibromyalgia . Fiancee 15 year older and doesn't want children   Past Medical History:  has a past medical history of Anxiety, unspecified and Fibromyalgia (2009).  Past Surgical History:  has no past surgical history on file. Family History: family history includes Alcohol abuse in her paternal grandfather and paternal grandmother; Breast cancer in her maternal aunt and paternal grandmother; Cancer in her father; Colon cancer in her maternal grandfather; Diabetes type II in her father, mother, and paternal grandmother; Heart disease in her maternal grandfather; High blood pressure (Hypertension) in her maternal grandfather, maternal grandmother, and paternal grandfather; Hyperlipidemia (Elevated cholesterol) in her maternal grandmother; No Known Problems in her brother and sister; Prostate cancer in her father. Social History:  reports that she has never smoked. She has never used smokeless tobacco. She reports that she does not drink alcohol or use drugs. OB/GYN History:          OB History    Gravida  1   Para  1   Term      Preterm      AB      Living  1     SAB      TAB      Ectopic      Molar      Multiple      Live Births  1          Allergies: is allergic to coconut; other; and azithromycin. Medications:  Current Outpatient Medications:  .  DULoxetine (CYMBALTA) 20 MG DR capsule, Take 20 mg by mouth once daily, Disp: , Rfl:  .  levonorgestrel (MIRENA 52 MG) 20 mcg/24 hr (5 years) IUD, Insert 1 each into the uterus once Follow package directions., Disp: , Rfl:   Review of Systems: General:                      No fatigue or weight loss Eyes:                           No  vision changes Ears:                            No hearing difficulty Respiratory:                No cough or shortness of breath Pulmonary:                  No asthma or shortness of breath Cardiovascular:           No chest pain, palpitations, dyspnea on exertion Gastrointestinal:          No abdominal bloating, chronic diarrhea, constipations, masses, pain or hematochezia Genitourinary:             No hematuria, dysuria, abnormal vaginal discharge, pelvic pain, Menometrorrhagia Lymphatic:                   No swollen lymph nodes Musculoskeletal:         No muscle weakness Neurologic:  No extremity weakness, syncope, seizure disorder Psychiatric:                  No history of depression, delusions or suicidal/homicidal ideation    Exam:      Vitals:   06/24/17 1309  BP: 130/80  Pulse: 78    Body mass index is 48.16 kg/m.  WDWN black female in NAD   Lungs: CTA  CV : RRR without murmur   Neck:  no thyromegaly Abdomen: soft , no mass, normal active bowel sounds,  non-tender, no rebound tenderness Pelvic: tanner stage 5 ,  External genitalia: vulva /labia no lesions Urethra: no prolapse Vagina: normal physiologic d/cwet mount : + yeast  Cervix: no lesions, no cervical motion tenderness  . IUD strings seen  Uterus: normal size shape and contour, non-tender Adnexa: no mass,  non-tender   Rectovaginal:  Impression:   The primary encounter diagnosis was Encounter for sterilization. Diagnoses of Morbid obesity with BMI of 45.0-49.9, adult (CMS-HCC), Monilial vaginitis, and Leukorrhea were also pertinent to this visit.    Plan:  Schedule for L/S BTL . Failure rates discussed .   Patient was counseled regarding weight loss. I recommend aerobic exercise 3-4x/ week for 30-45 minutes per session.  She need to go on a caloric restricted diet . I have supplied her with a 1400 cal diet  Behavior changes were also discussed . 15 minutes were spent in  counseling . Check insulin , glucose and TSH   Diflucan 150 mg and repeat in 1 weeks    Toni Robertson Toni GammaJANSE Everli Rother, MD

## 2017-06-29 NOTE — ED Notes (Signed)
Pt discharged home after verbalizing understanding of discharge instructions; nad noted. 

## 2017-06-29 NOTE — ED Provider Notes (Signed)
[facility] Emergency Department Provider Note  ____________________________________________  Time seen: Approximately 2:45 PM  I have reviewed the triage vital signs and the nursing notes.   HISTORY  Chief Complaint Pelvic Pain   HPI Harshita Bernales is a 31 y.o. female with a history of fibromyalgiaand obesity who presents for evaluation of abdominal pain. Patient was here yesterday for same pain that started 3 days ago after patient underwent a pelvic exam. The pelvic was done by her OB/GYN to insure that her IUD was still in place. She reports that the pain is sharp, severe, initially started on the right side but now is located in her entire lower abdomen, constant and nonradiating, associated with nausea and vomiting. No diarrhea, constipation, fever, chills, vaginal discharge, vaginal bleeding, dysuria or hematuria. Patient was discharged home on oxycodone which she has not filled because it has made her sick in the past. She hasn't tried anything at home for the pain. Yesterday she was here and had a pelvic exam, with negative wet prep, gonorrhea and chlamydia, transvaginal ultrasound which was within normal limits a.m. CT scan of her abdomen which showed no etiology for her pain. Patient reports the pain is identical and not any different than yesterday.  Past Medical History:  Diagnosis Date  . Fibromyalgia     There are no active problems to display for this patient.   Past Surgical History:  Procedure Laterality Date  . NO PAST SURGERIES      Prior to Admission medications   Medication Sig Start Date End Date Taking? Authorizing Provider  amitriptyline (ELAVIL) 10 MG tablet Take 10 mg by mouth at bedtime.    [provider]  ibuprofen (ADVIL,MOTRIN) 800 MG tablet Take 1 tablet (800 mg total) by mouth every 8 (eight) hours as needed. 06/29/17   Nita Sickle, MD  levonorgestrel [facility]) 20 MCG/24HR IUD 1 each by Intrauterine route  once.    [provider]  ondansetron (ZOFRAN) 4 MG tablet Take 1 tablet (4 mg total) by mouth every 8 (eight) hours as needed for nausea or vomiting. 06/28/17   Jeanmarie Plant, MD  oxyCODONE-acetaminophen (ROXICET) 5-325 MG tablet Take 1 tablet by mouth every 6 (six) hours as needed for severe pain. 06/28/17   Jeanmarie Plant, MD  predniSONE (DELTASONE) 10 MG tablet 5 tablets daily x 3 days, then 4 tablets daily x 3 days, then 3 tablets daily x 3 days, then 2 tablets daily x 3 days, then 1 tablet daily x 3 days. 05/05/17   Tommie Sams, DO    Allergies Coconut flavor and Azithromycin  History reviewed. No pertinent family history.  Social History Social History   Tobacco Use  . Smoking status: Never Smoker  . Smokeless tobacco: Never Used  Substance Use Topics  . Alcohol use: No  . Drug use: No    Review of Systems  Constitutional: Negative for fever. Eyes: Negative for visual changes. ENT: Negative for sore throat. Neck: No neck pain  Cardiovascular: Negative for chest pain. Respiratory: Negative for shortness of breath. Gastrointestinal: + lower abdominal pain, nausea, and vomiting. No diarrhea. Genitourinary: Negative for dysuria. Musculoskeletal: Negative for back pain. Skin: Negative for rash. Neurological: Negative for headaches, weakness or numbness. Psych: No SI or HI  ____________________________________________   PHYSICAL EXAM:  VITAL SIGNS: ED Triage Vitals  Enc Vitals Group     BP 06/29/17 1325 (!) 154/80     Pulse Rate 06/29/17 1325 89  Resp 06/29/17 1325 14     Temp 06/29/17 1325 98.7 F (37.1 C)     Temp Source 06/29/17 1325 Oral     SpO2 06/29/17 1325 100 %     Weight 06/29/17 1325 285 lb (129.3 kg)     Height 06/29/17 1325 5\' 4"  (1.626 m)     Head Circumference --      Peak Flow --      Pain Score 06/29/17 1335 10     Pain Loc --      Pain Edu? --      Excl. in GC? --     Constitutional: Alert and oriented. Well appearing  and in no apparent distress. HEENT:      Head: Normocephalic and atraumatic.         Eyes: Conjunctivae are normal. Sclera is non-icteric.       Mouth/Throat: Mucous membranes are moist.       Neck: Supple with no signs of meningismus. Cardiovascular: Regular rate and rhythm. No murmurs, gallops, or rubs. 2+ symmetrical distal pulses are present in all extremities. No JVD. Respiratory: Normal respiratory effort. Lungs are clear to auscultation bilaterally. No wheezes, crackles, or rhonchi.  Gastrointestinal: Obese, diffusely tender to palpation over the lower abdominal region bilaterally. positive bowel sounds. No rebound or guarding. Genitourinary: No CVA tenderness. Musculoskeletal: Nontender with normal range of motion in all extremities. No edema, cyanosis, or erythema of extremities. Neurologic: Normal speech and language. Face is symmetric. Moving all extremities. No gross focal neurologic deficits are appreciated. Skin: Skin is warm, dry and intact. No rash noted. Psychiatric: Mood and affect are normal. Speech and behavior are normal.  ____________________________________________   LABS (all labs ordered are listed, but only abnormal results are displayed)  Labs Reviewed  CBC WITH DIFFERENTIAL/PLATELET - Abnormal; Notable for the following components:      Result Value   Neutro Abs 7.8 (*)    All other components within normal limits  COMPREHENSIVE METABOLIC PANEL - Abnormal; Notable for the following components:   Glucose, Bld 103 (*)    Calcium 8.8 (*)    ALT 13 (*)    All other components within normal limits  URINALYSIS, COMPLETE (UACMP) WITH MICROSCOPIC - Abnormal; Notable for the following components:   Color, Urine YELLOW (*)    APPearance CLOUDY (*)    Hgb urine dipstick MODERATE (*)    Ketones, ur 5 (*)    Squamous Epithelial / LPF 0-5 (*)    All other components within normal limits  HCG, QUANTITATIVE, PREGNANCY  TROPONIN I    ____________________________________________  EKG  ED ECG REPORT I, Nita Sicklearolina Theda Payer, the attending physician, personally viewed and interpreted this ECG.  Normal sinus rhythm, rate of 98, normal intervals, normal axis, no ST elevations or depressions. Normal EKG.  ____________________________________________  RADIOLOGY  none  ____________________________________________   PROCEDURES  Procedure(s) performed: None Procedures Critical Care performed:  None ____________________________________________   INITIAL IMPRESSION / ASSESSMENT AND PLAN / ED COURSE   31 y.o. female with a history of fibromyalgiaand obesity who presents for evaluation of bilateral sharp severe lower abdominal pain, nausea, and vomiting x 4 days. Patient with normal pelvic exam, negative STD screening, normal CT a/p with contrast and transvaginal US yesterday. Abdomens is obese with diffuse tenderness palpation on the lower quadrants, no rebound or guarding. Patient has normal vital signs. Unclear etiology at this time. Obgyn thinks pain is due to IUD and patient is scheduled to have it removed this week.  Will give toradol, tylenol, and zofran. Will repeat blood work and check hCG.   Clinical Course as of Jun 29 1709  Mon Jun 29, 2017  1533 Patient anxious, crying, complaining she is having a hard time breathing and she feels her heart is jumping inside her chest. EKG ordered. Will add troponin. Reassurance provide and patient instructed to calm down and breath nice and slow. Vitals stable  [CV]    Clinical Course User Index [CV] Don PerkingVeronese, [place], MD    _________________________ 5:08 PM on 06/29/2017 -----------------------------------------  Labs with no significant findings other than the hematuria. Patient had 6-30 RBCs in her urine yesterday and too numerous to count today. It could be that her symptoms are from a kidney stone. I evaluated the CT from yesterday and I do not see a stone but maybe  she has a very small one missed in the CT especially since the CT was done with contrast. Patient reports feeling improved after IV toradol. Will dc on standing 800mg  of ibuprofen, PRN zofran and percocet (prescribed yesterday), increase PO hydration and close follow up. Discussed return precautions with patient.  As part of my medical decision making, I reviewed the following data within the electronic MEDICAL RECORD NUMBER Nursing notes reviewed and incorporated, Labs reviewed , EKG interpreted , Old chart reviewed, Notes from prior ED visits and [place] Controlled Substance Database    Pertinent labs & imaging results that were available during my care of the patient were reviewed by me and considered in my medical decision making (see chart for details).    ____________________________________________   FINAL CLINICAL IMPRESSION(S) / ED DIAGNOSES  Final diagnoses:  Generalized abdominal pain  Hematuria, unspecified type      NEW MEDICATIONS STARTED DURING THIS VISIT:  ED Discharge Orders        Ordered    ibuprofen (ADVIL,MOTRIN) 800 MG tablet  Every 8 hours PRN     06/29/17 1706       Note:  This document was prepared using Dragon voice recognition software and may include unintentional dictation errors.    Nita SickleVeronese, [place], MD 06/29/17 [id number]

## 2017-06-29 NOTE — Discharge Instructions (Signed)
As I explained to you, you have blood in your urine and could be due to a small kidney stone that was not seen on your CT scan yesterday. Take 800mg  of ibuprofen every 6 hours with 1 percocet every 4-6 hours for breakthrough pain. Drink lots of fluids and take zofran as needed for nausea. Follow up with your doctor in 2 days for re-check or return to the ER if you have new or worsening pain, or any new symptoms not present today.

## 2017-06-29 NOTE — ED Notes (Signed)
When this nurse entered room to start iv and give medications, pt was crying. She stated that her chest hurt, she could not breathe well, and that she couldn't feel her legs. VS taken, Dr Don PerkingVeronese notified, EKG performed. IV then placed, medication given. Pt reports some resolution of cp (from 8/10 to 4/10), but states her legs are still not right. Will continue to monitor.

## 2017-06-29 NOTE — ED Notes (Signed)
Patient states she was here yesterday for same complaint.  Returns today because "the pain is just constant".  Patient is tearful, complaining of pain RLQ abdomen.  Dr. Don PerkingVeronese in to see patient.

## 2017-06-29 NOTE — ED Triage Notes (Signed)
Patient presents with pelvic pain. Was seen here last night and told she has small cyst on ovary but not big enough to operate. Reports she cannot take oxycodone she was prescribed due to nausea and zofran she was prescribed is not helping. Reports she is back due to increased pain. Has appointment with OBGYN tomorrow.

## 2017-07-10 ENCOUNTER — Encounter
Admission: RE | Admit: 2017-07-10 | Discharge: 2017-07-10 | Disposition: A | Discharge status: Home / Self Care | Payer: BLUE CROSS/BLUE SHIELD | Source: Ambulatory Visit | Attending: Obstetrics and Gynecology | Admitting: Obstetrics and Gynecology

## 2017-07-10 ENCOUNTER — Other Ambulatory Visit: Payer: Self-pay

## 2017-07-10 HISTORY — DX: Headache, unspecified: R51.9

## 2017-07-10 HISTORY — DX: Family history of other specified conditions: Z84.89

## 2017-07-10 HISTORY — DX: Personal history of urinary calculi: Z87.442

## 2017-07-10 HISTORY — DX: Headache: R51

## 2017-07-10 HISTORY — DX: Personal history of Methicillin resistant Staphylococcus aureus infection: Z86.14

## 2017-07-10 NOTE — Patient Instructions (Signed)
  Your procedure is scheduled on: 07-17-16 FRIDAY Report to Same Day Surgery 2nd floor medical mall [facility](Medical Mall Entrance-take elevator on left to 2nd floor.  Check in with surgery information desk.) To find out your arrival time please call [phone] between 1PM - 3PM on 07-16-16 THURSDAY  Remember: Instructions that are not followed completely may result in serious medical risk, up to and including death, or upon the discretion of your surgeon and anesthesiologist your surgery may need to be rescheduled.    _x___ 1. Do not eat food after midnight the night before your procedure. NO GUM OR CANDY AFTER MIDNIGHT. You may drink clear liquids up to 2 hours before you are scheduled to arrive at the hospital for your procedure.  Do not drink clear liquids within 2 hours of your scheduled arrival to the hospital.  Clear liquids include  --Water or Apple juice without pulp  --Clear carbohydrate beverage such as ClearFast or Gatorade  --Black Coffee or Clear Tea (No milk, no creamers, do not add anything to  the coffee or Tea       __x__ 2. No Alcohol for 24 hours before or after surgery.   __x__3. No Smoking for 24 prior to surgery.   ____  4. Bring all medications with you on the day of surgery if instructed.    __x__ 5. Notify your doctor if there is any change in your medical condition     (cold, fever, infections).     Do not wear jewelry, make-up, hairpins, clips or nail polish.  Do not wear lotions, powders, or perfumes. You may wear deodorant.  Do not shave 48 hours prior to surgery. Men may shave face and neck.  Do not bring valuables to the hospital.    [facility] is not responsible for any belongings or valuables.               Contacts, dentures or bridgework may not be worn into surgery.  Leave your suitcase in the car. After surgery it may be brought to your room.  For patients admitted to the hospital, discharge time is determined by your  treatment team.   Patients  discharged the day of surgery will not be allowed to drive home.  You will need someone to drive you home and stay with you the night of your procedure.    Please read over the following fact sheets that you were given:   [facility] Preparing for [facility] and or MRSA Information   ____ Take anti-hypertensive listed below, cardiac, seizure, asthma,anti-reflux and psychiatric medicines. These include:  1. NONE  2.  3.  4.  5.  6.  ____Fleets enema or Magnesium Citrate as directed.   _x___ Use CHG Soap or sage wipes as directed on instruction sheet   ____ Use inhalers on the day of surgery and bring to hospital day of surgery  ____ Stop Metformin and Janumet 2 days prior to surgery.    ____ Take 1/2 of usual insulin dose the night before surgery and none on the morning surgery.   ____ Follow recommendations from Cardiologist, Pulmonologist or PCP regarding stopping Aspirin, Coumadin, Plavix ,Eliquis, Effient, or Pradaxa, and Pletal.  X____Stop Anti-inflammatories such as Advil, Aleve, IBUPROFEN,  Motrin, Naproxen, Naprosyn, Goodies powders or aspirin products NOW-OK to take Tylenol   ____ Stop supplements until after surgery.     ____ Bring C-Pap to the hospital.

## 2017-07-13 ENCOUNTER — Encounter
Admission: RE | Admit: 2017-07-13 | Discharge: 2017-07-13 | Disposition: A | Discharge status: Home / Self Care | Payer: BLUE CROSS/BLUE SHIELD | Source: Ambulatory Visit | Attending: Obstetrics and Gynecology | Admitting: Obstetrics and Gynecology

## 2017-07-13 DIAGNOSIS — Z01818 Encounter for other preprocedural examination: Secondary | ICD-10-CM | POA: Insufficient documentation

## 2017-07-13 LAB — BASIC METABOLIC PANEL WITH GFR
Anion gap: 9 (ref 5–15)
BUN: 16 mg/dL (ref 6–20)
CO2: 25 mmol/L (ref 22–32)
Calcium: 9 mg/dL (ref 8.9–10.3)
Chloride: 106 mmol/L (ref 101–111)
Creatinine, Ser: 0.87 mg/dL (ref 0.44–1.00)
GFR calc Af Amer: >60 mL/min
GFR calc non Af Amer: >60 mL/min
Glucose, Bld: 96 mg/dL (ref 65–99)
Potassium: 3.9 mmol/L (ref 3.5–5.1)
Sodium: 140 mmol/L (ref 135–145)

## 2017-07-13 LAB — CBC
HCT: 39.7 % (ref 35.0–47.0)
Hemoglobin: 13.2 g/dL (ref 12.0–16.0)
MCH: 28 pg (ref 26.0–34.0)
MCHC: 33.3 g/dL (ref 32.0–36.0)
MCV: 84.2 fL (ref 80.0–100.0)
PLATELETS: 334 10*3/uL (ref 150–440)
RBC: 4.71 MIL/uL (ref 3.80–5.20)
RDW: 14.5 % (ref 11.5–14.5)
WBC: 9.9 10*3/uL (ref 3.6–11.0)

## 2017-07-13 LAB — TYPE AND SCREEN
ABO/RH(D): O POS
Antibody Screen: NEGATIVE

## 2017-07-17 ENCOUNTER — Ambulatory Visit: Payer: BLUE CROSS/BLUE SHIELD | Admitting: Anesthesiology

## 2017-07-17 ENCOUNTER — Encounter: Payer: Self-pay | Admitting: Anesthesiology

## 2017-07-17 ENCOUNTER — Encounter
Admission: RE | Disposition: A | Discharge status: Home / Self Care | Payer: Self-pay | Source: Ambulatory Visit | Attending: Obstetrics and Gynecology

## 2017-07-17 ENCOUNTER — Ambulatory Visit
Admission: RE | Admit: 2017-07-17 | Discharge: 2017-07-17 | Disposition: A | Discharge status: Home / Self Care | Payer: BLUE CROSS/BLUE SHIELD | Source: Ambulatory Visit | Attending: Obstetrics and Gynecology | Admitting: Obstetrics and Gynecology

## 2017-07-17 DIAGNOSIS — Z8349 Family history of other endocrine, nutritional and metabolic diseases: Secondary | ICD-10-CM | POA: Diagnosis not present

## 2017-07-17 DIAGNOSIS — Z8249 Family history of ischemic heart disease and other diseases of the circulatory system: Secondary | ICD-10-CM | POA: Diagnosis not present

## 2017-07-17 DIAGNOSIS — Z803 Family history of malignant neoplasm of breast: Secondary | ICD-10-CM | POA: Diagnosis not present

## 2017-07-17 DIAGNOSIS — Z91018 Allergy to other foods: Secondary | ICD-10-CM | POA: Insufficient documentation

## 2017-07-17 DIAGNOSIS — Z79899 Other long term (current) drug therapy: Secondary | ICD-10-CM | POA: Diagnosis not present

## 2017-07-17 DIAGNOSIS — Z8614 Personal history of Methicillin resistant Staphylococcus aureus infection: Secondary | ICD-10-CM | POA: Insufficient documentation

## 2017-07-17 DIAGNOSIS — Z8042 Family history of malignant neoplasm of prostate: Secondary | ICD-10-CM | POA: Diagnosis not present

## 2017-07-17 DIAGNOSIS — F419 Anxiety disorder, unspecified: Secondary | ICD-10-CM | POA: Diagnosis not present

## 2017-07-17 DIAGNOSIS — Z302 Encounter for sterilization: Secondary | ICD-10-CM | POA: Diagnosis not present

## 2017-07-17 DIAGNOSIS — Z881 Allergy status to other antibiotic agents status: Secondary | ICD-10-CM | POA: Diagnosis not present

## 2017-07-17 DIAGNOSIS — Z833 Family history of diabetes mellitus: Secondary | ICD-10-CM | POA: Insufficient documentation

## 2017-07-17 DIAGNOSIS — M797 Fibromyalgia: Secondary | ICD-10-CM | POA: Insufficient documentation

## 2017-07-17 DIAGNOSIS — Z6841 Body Mass Index (BMI) 40.0 and over, adult: Secondary | ICD-10-CM | POA: Diagnosis not present

## 2017-07-17 DIAGNOSIS — Z87442 Personal history of urinary calculi: Secondary | ICD-10-CM | POA: Insufficient documentation

## 2017-07-17 DIAGNOSIS — Z811 Family history of alcohol abuse and dependence: Secondary | ICD-10-CM | POA: Insufficient documentation

## 2017-07-17 DIAGNOSIS — B373 Candidiasis of vulva and vagina: Secondary | ICD-10-CM | POA: Insufficient documentation

## 2017-07-17 DIAGNOSIS — Z800 Family history of malignant neoplasm of digestive organs: Secondary | ICD-10-CM | POA: Diagnosis not present

## 2017-07-17 DIAGNOSIS — Z30432 Encounter for removal of intrauterine contraceptive device: Secondary | ICD-10-CM | POA: Insufficient documentation

## 2017-07-17 HISTORY — PX: LAPAROSCOPIC TUBAL LIGATION: SHX1937

## 2017-07-17 HISTORY — PX: IUD REMOVAL: SHX5392

## 2017-07-17 LAB — POCT PREGNANCY, URINE: Preg Test, Ur: NEGATIVE

## 2017-07-17 LAB — ABO/RH: ABO/RH(D): O POS

## 2017-07-17 SURGERY — LIGATION, FALLOPIAN TUBE, LAPAROSCOPIC
Anesthesia: General | Wound class: Clean Contaminated

## 2017-07-17 MED ORDER — LACTATED RINGERS IV SOLN
INTRAVENOUS | Status: DC
  Administered 2017-07-17 (×2): via INTRAVENOUS

## 2017-07-17 MED ORDER — LIDOCAINE HCL (PF) 2 % IJ SOLN
INTRAMUSCULAR | Status: AC
  Filled 2017-07-17: qty 10

## 2017-07-17 MED ORDER — KETOROLAC TROMETHAMINE 30 MG/ML IJ SOLN
INTRAMUSCULAR | Status: DC | PRN
  Administered 2017-07-17: 30 mg via INTRAVENOUS

## 2017-07-17 MED ORDER — BUPIVACAINE HCL 0.5 % IJ SOLN
INTRAMUSCULAR | Status: DC | PRN
  Administered 2017-07-17: 2 mL

## 2017-07-17 MED ORDER — PROPOFOL 10 MG/ML IV BOLUS
INTRAVENOUS | Status: DC | PRN
  Administered 2017-07-17: 200 mg via INTRAVENOUS

## 2017-07-17 MED ORDER — DEXAMETHASONE SODIUM PHOSPHATE 10 MG/ML IJ SOLN
INTRAMUSCULAR | Status: AC
  Filled 2017-07-17: qty 1

## 2017-07-17 MED ORDER — SUGAMMADEX SODIUM 200 MG/2ML IV SOLN
INTRAVENOUS | Status: AC
  Filled 2017-07-17: qty 2

## 2017-07-17 MED ORDER — ROCURONIUM BROMIDE 100 MG/10ML IV SOLN
INTRAVENOUS | Status: DC | PRN
  Administered 2017-07-17: 50 mg via INTRAVENOUS

## 2017-07-17 MED ORDER — FENTANYL CITRATE (PF) 100 MCG/2ML IJ SOLN
INTRAMUSCULAR | Status: AC
  Filled 2017-07-17: qty 2

## 2017-07-17 MED ORDER — SUGAMMADEX SODIUM 200 MG/2ML IV SOLN
INTRAVENOUS | Status: DC | PRN
  Administered 2017-07-17: 517.2 mg via INTRAVENOUS

## 2017-07-17 MED ORDER — LACTATED RINGERS IV SOLN: INTRAVENOUS | Status: DC

## 2017-07-17 MED ORDER — FENTANYL CITRATE (PF) 100 MCG/2ML IJ SOLN
INTRAMUSCULAR | Status: AC
  Administered 2017-07-17: 25 ug via INTRAVENOUS
  Filled 2017-07-17: qty 2

## 2017-07-17 MED ORDER — MIDAZOLAM HCL 2 MG/2ML IJ SOLN
INTRAMUSCULAR | Status: AC
  Filled 2017-07-17: qty 2

## 2017-07-17 MED ORDER — ONDANSETRON HCL 4 MG/2ML IJ SOLN
INTRAMUSCULAR | Status: DC | PRN
  Administered 2017-07-17: 4 mg via INTRAVENOUS

## 2017-07-17 MED ORDER — PROMETHAZINE HCL 25 MG/ML IJ SOLN
12.5000 mg | Freq: Once | INTRAMUSCULAR | Status: AC
  Administered 2017-07-17: 12.5 mg via INTRAVENOUS

## 2017-07-17 MED ORDER — ONDANSETRON HCL 4 MG/2ML IJ SOLN
INTRAMUSCULAR | Status: AC
  Filled 2017-07-17: qty 2

## 2017-07-17 MED ORDER — FAMOTIDINE 20 MG PO TABS
20.0000 mg | ORAL_TABLET | Freq: Once | ORAL | Status: AC
  Administered 2017-07-17: 20 mg via ORAL

## 2017-07-17 MED ORDER — MIDAZOLAM HCL 2 MG/2ML IJ SOLN
INTRAMUSCULAR | Status: DC | PRN
  Administered 2017-07-17: 2 mg via INTRAVENOUS

## 2017-07-17 MED ORDER — DEXAMETHASONE SODIUM PHOSPHATE 10 MG/ML IJ SOLN
INTRAMUSCULAR | Status: DC | PRN
  Administered 2017-07-17: 10 mg via INTRAVENOUS

## 2017-07-17 MED ORDER — LIDOCAINE HCL (CARDIAC) 20 MG/ML IV SOLN
INTRAVENOUS | Status: DC | PRN
  Administered 2017-07-17: 100 mg via INTRAVENOUS

## 2017-07-17 MED ORDER — HYDROMORPHONE HCL 1 MG/ML IJ SOLN
INTRAMUSCULAR | Status: AC
  Administered 2017-07-17: 0.5 mg via INTRAVENOUS
  Filled 2017-07-17: qty 1

## 2017-07-17 MED ORDER — FENTANYL CITRATE (PF) 100 MCG/2ML IJ SOLN
INTRAMUSCULAR | Status: DC | PRN
  Administered 2017-07-17 (×4): 50 ug via INTRAVENOUS

## 2017-07-17 MED ORDER — HYDROMORPHONE HCL 1 MG/ML IJ SOLN
0.5000 mg | INTRAMUSCULAR | Status: DC | PRN
  Administered 2017-07-17 (×3): 0.5 mg via INTRAVENOUS

## 2017-07-17 MED ORDER — OXYCODONE HCL 5 MG/5ML PO SOLN: 5.0000 mg | Freq: Once | ORAL | Status: AC | PRN

## 2017-07-17 MED ORDER — KETOROLAC TROMETHAMINE 30 MG/ML IJ SOLN
INTRAMUSCULAR | Status: AC
  Filled 2017-07-17: qty 1

## 2017-07-17 MED ORDER — FENTANYL CITRATE (PF) 100 MCG/2ML IJ SOLN
25.0000 ug | INTRAMUSCULAR | Status: DC | PRN
  Administered 2017-07-17: 50 ug via INTRAVENOUS
  Administered 2017-07-17 (×2): 25 ug via INTRAVENOUS
  Administered 2017-07-17: 50 ug via INTRAVENOUS

## 2017-07-17 MED ORDER — ROCURONIUM BROMIDE 50 MG/5ML IV SOLN
INTRAVENOUS | Status: AC
  Filled 2017-07-17: qty 1

## 2017-07-17 MED ORDER — OXYCODONE HCL 5 MG PO TABS
5.0000 mg | ORAL_TABLET | Freq: Once | ORAL | Status: AC | PRN
Start: 2017-07-17 — End: 2017-07-17
  Administered 2017-07-17: 5 mg via ORAL

## 2017-07-17 MED ORDER — FENTANYL CITRATE (PF) 100 MCG/2ML IJ SOLN
INTRAMUSCULAR | Status: AC
  Administered 2017-07-17: 50 ug via INTRAVENOUS
  Filled 2017-07-17: qty 2

## 2017-07-17 MED ORDER — OXYCODONE HCL 5 MG PO TABS
ORAL_TABLET | ORAL | Status: AC
  Filled 2017-07-17: qty 1

## 2017-07-17 MED ORDER — FAMOTIDINE 20 MG PO TABS
ORAL_TABLET | ORAL | Status: AC
  Filled 2017-07-17: qty 1

## 2017-07-17 MED ORDER — PROPOFOL 10 MG/ML IV BOLUS
INTRAVENOUS | Status: AC
  Filled 2017-07-17: qty 20

## 2017-07-17 SURGICAL SUPPLY — 34 items
APPLICATOR SWAB PROCTO LG 16IN (MISCELLANEOUS) ×4 IMPLANT
BLADE SURG SZ11 CARB STEEL (BLADE) ×4 IMPLANT
CATH ROBINSON RED A/P 16FR (CATHETERS) ×4 IMPLANT
CHLORAPREP W/TINT 26ML (MISCELLANEOUS) ×4 IMPLANT
CLOSURE WOUND 1/4X4 (GAUZE/BANDAGES/DRESSINGS) ×1
DRSG TEGADERM 2-3/8X2-3/4 SM (GAUZE/BANDAGES/DRESSINGS) ×8 IMPLANT
GAUZE SPONGE NON-WVN 2X2 STRL (MISCELLANEOUS) ×2 IMPLANT
GLOVE BIO SURGEON STRL SZ8 (GLOVE) ×4 IMPLANT
GOWN STRL REUS W/ TWL LRG LVL3 (GOWN DISPOSABLE) ×2 IMPLANT
GOWN STRL REUS W/ TWL XL LVL3 (GOWN DISPOSABLE) ×2 IMPLANT
GOWN STRL REUS W/TWL LRG LVL3 (GOWN DISPOSABLE) ×2
GOWN STRL REUS W/TWL XL LVL3 (GOWN DISPOSABLE) ×2
KIT DISPOSABLE FALLOPE RING (Ring) ×4 IMPLANT
KIT PINK PAD W/HEAD ARE REST (MISCELLANEOUS) ×4
KIT PINK PAD W/HEAD ARM REST (MISCELLANEOUS) ×2 IMPLANT
KIT RM TURNOVER CYSTO AR (KITS) ×4 IMPLANT
LABEL OR SOLS (LABEL) ×4 IMPLANT
NS IRRIG 500ML POUR BTL (IV SOLUTION) ×4 IMPLANT
PACK DNC HYST (MISCELLANEOUS) ×4 IMPLANT
PACK GYN LAPAROSCOPIC (MISCELLANEOUS) ×4 IMPLANT
PAD OB MATERNITY 4.3X12.25 (PERSONAL CARE ITEMS) ×4 IMPLANT
PAD PREP 24X41 OB/GYN DISP (PERSONAL CARE ITEMS) ×4 IMPLANT
SHEARS HARMONIC ACE PLUS 36CM (ENDOMECHANICALS) IMPLANT
SPONGE VERSALON 2X2 STRL (MISCELLANEOUS) ×2
STRIP CLOSURE SKIN 1/4X4 (GAUZE/BANDAGES/DRESSINGS) ×3 IMPLANT
SUT VIC AB 0 CT1 36 (SUTURE) ×4 IMPLANT
SUT VIC AB 2-0 UR6 27 (SUTURE) ×4 IMPLANT
SUT VIC AB 4-0 SH 27 (SUTURE) ×2
SUT VIC AB 4-0 SH 27XANBCTRL (SUTURE) ×2 IMPLANT
SWABSTK COMLB BENZOIN TINCTURE (MISCELLANEOUS) ×4 IMPLANT
TOWEL OR 17X26 4PK STRL BLUE (TOWEL DISPOSABLE) ×4 IMPLANT
TROCAR ENDO BLADELESS 11MM (ENDOMECHANICALS) IMPLANT
TROCAR XCEL NON-BLD 5MMX100MML (ENDOMECHANICALS) ×4 IMPLANT
TUBING INSUFFLATION (TUBING) ×4 IMPLANT

## 2017-07-17 NOTE — Brief Op Note (Signed)
07/17/2017  11:46 AM  PATIENT:  Toni Robertson  32 y.o. female  PRE-OPERATIVE DIAGNOSIS:  Elective Sterilization  POST-OPERATIVE DIAGNOSIS:  Elective Sterilization  PROCEDURE:  Procedure(s): LAPAROSCOPIC TUBAL LIGATION (Bilateral) INTRAUTERINE DEVICE (IUD) REMOVAL (N/A)  SURGEON:  Surgeon(s) and Role:    * Schermerhorn, Ihor Austinhomas J, MD - Primary  PHYSICIAN ASSISTANT: scrub tech  ASSISTANTS: none   ANESTHESIA:   geta EBL:  none   BLOOD ADMINISTERED:none  DRAINS: none   LOCAL MEDICATIONS USED:  MARCAINE     SPECIMEN:  No Specimen  DISPOSITION OF SPECIMEN:  N/A  COUNTS:  YES  TOURNIQUET:  * No tourniquets in log *  DICTATION: .Other Dictation: Dictation Number verbal   PLAN OF CARE: Discharge to home after PACU  PATIENT DISPOSITION:  PACU - hemodynamically stable.   Delay start of Pharmacological VTE agent (>24hrs) due to surgical blood loss or risk of bleeding: not applicable

## 2017-07-17 NOTE — Transfer of Care (Signed)
Immediate Anesthesia Transfer of Care Note  Patient: Toni Robertson  Procedure(s) Performed: LAPAROSCOPIC TUBAL LIGATION (Bilateral ) INTRAUTERINE DEVICE (IUD) REMOVAL (N/A )  Patient Location: PACU  Anesthesia Type:General  Level of Consciousness: awake  Airway & Oxygen Therapy: Patient Spontanous Breathing and Patient connected to face mask oxygen  Post-op Assessment: Report given to RN and Post -op Vital signs reviewed and stable  Post vital signs: Reviewed and stable  Last Vitals:  Vitals:   07/17/17 0840 07/17/17 1201  BP: 140/67 133/63  Pulse: 74 100  Resp: 17 (!) 21  Temp: 36.8 C (!) 36.1 C  SpO2: 100% 100%    Last Pain:  Vitals:   07/17/17 0840  TempSrc: Oral         Complications: No apparent anesthesia complications

## 2017-07-17 NOTE — Anesthesia Preprocedure Evaluation (Addendum)
Anesthesia Evaluation  Patient identified by MRN, date of birth, ID band Patient awake    Reviewed: Allergy & Precautions, H&P , NPO status , Patient's Chart, lab work & pertinent test results  History of Anesthesia Complications (+) Family history of anesthesia reaction and history of anesthetic complications  Airway Mallampati: III  TM Distance: >3 FB Neck ROM: full    Dental  (+) Chipped, Poor Dentition   Pulmonary neg pulmonary ROS,           Cardiovascular Exercise Tolerance: Good (-) angina(-) Past MI and (-) DOE negative cardio ROS       Neuro/Psych  Headaches,  Neuromuscular disease negative psych ROS   GI/Hepatic negative GI ROS, Neg liver ROS, neg GERD  ,  Endo/Other  Morbid obesity  Renal/GU      Musculoskeletal  (+) Fibromyalgia -  Abdominal   Peds  Hematology negative hematology ROS (+)   Anesthesia Other Findings Past Medical History: No date: Family history of adverse reaction to anesthesia     Comment:  MATERNAL GRANDFATHER-HARD TO WAKE UP No date: Fibromyalgia No date: Headache     Comment:  MIGRAINE 06/2017: History of kidney stones 2013: History of methicillin resistant staphylococcus aureus (MRSA)  Past Surgical History: No date: NO PAST SURGERIES  BMI    Body Mass Index:  48.92 kg/m      Reproductive/Obstetrics negative OB ROS                             Anesthesia Physical Anesthesia Plan  ASA: III  Anesthesia Plan: General ETT   Post-op Pain Management:    Induction: Intravenous  PONV Risk Score and Plan: Ondansetron, Dexamethasone and Midazolam  Airway Management Planned: Oral ETT  Additional Equipment:   Intra-op Plan:   Post-operative Plan: Extubation in OR  Informed Consent: I have reviewed the patients History and Physical, chart, labs and discussed the procedure including the risks, benefits and alternatives for the proposed  anesthesia with the patient or authorized representative who has indicated his/her understanding and acceptance.   Dental Advisory Given  Plan Discussed with: Anesthesiologist, CRNA and Surgeon  Anesthesia Plan Comments: (Patient consented for risks of anesthesia including but not limited to:  - adverse reactions to medications - damage to teeth, lips or other oral mucosa - sore throat or hoarseness - Damage to heart, brain, lungs or loss of life  Patient voiced understanding.)        Anesthesia Quick Evaluation

## 2017-07-17 NOTE — Anesthesia Procedure Notes (Signed)
Procedure Name: Intubation Date/Time: 07/17/2017 11:06 AM Performed by: Eben Burow, CRNA Pre-anesthesia Checklist: Patient identified, Emergency Drugs available, Suction available, Patient being monitored and Timeout performed Patient Re-evaluated:Patient Re-evaluated prior to induction Oxygen Delivery Method: Circle system utilized Preoxygenation: Pre-oxygenation with 100% oxygen Induction Type: IV induction Ventilation: Mask ventilation without difficulty Laryngoscope Size: Miller and 2 Grade View: Grade I Tube type: Oral Tube size: 7.5 mm Number of attempts: 1 Airway Equipment and Method: Stylet and LTA kit utilized Placement Confirmation: ETT inserted through vocal cords under direct vision,  positive ETCO2 and breath sounds checked- equal and bilateral Secured at: 21 cm Tube secured with: Tape Dental Injury: Teeth and Oropharynx as per pre-operative assessment

## 2017-07-17 NOTE — Discharge Instructions (Signed)

## 2017-07-17 NOTE — Anesthesia Post-op Follow-up Note (Signed)
Anesthesia QCDR form completed.        

## 2017-07-17 NOTE — Progress Notes (Signed)
Pt ready for L/S BTL and removal of IUD . Labs reviewed , all questions answered . Proceed

## 2017-07-18 NOTE — Anesthesia Postprocedure Evaluation (Signed)
Anesthesia Post Note  Patient: Toni Robertson  Procedure(s) Performed: LAPAROSCOPIC TUBAL LIGATION (Bilateral ) INTRAUTERINE DEVICE (IUD) REMOVAL (N/A )  Patient location during evaluation: PACU Anesthesia Type: General Level of consciousness: awake and alert Pain management: pain level controlled Vital Signs Assessment: post-procedure vital signs reviewed and stable Respiratory status: spontaneous breathing, nonlabored ventilation, respiratory function stable and patient connected to nasal cannula oxygen Cardiovascular status: blood pressure returned to baseline and stable Postop Assessment: no apparent nausea or vomiting Anesthetic complications: no     Last Vitals:  Vitals:   07/17/17 1327 07/17/17 1437  BP: (!) 147/73 140/68  Pulse: 83 100  Resp:  17  Temp: (!) 36.2 C   SpO2: 97% 100%    Last Pain:  Vitals:   07/17/17 1437  TempSrc:   PainSc: 7                  Cleda MccreedyJoseph K Piscitello

## 2017-07-20 ENCOUNTER — Encounter: Payer: Self-pay | Admitting: Obstetrics and Gynecology

## 2017-07-20 NOTE — Op Note (Signed)
NAME:  Toni Robertson, Toni Robertson              ACCOUNT NO.:  [id number]  MEDICAL RECORD NO.:  [id number]  LOCATION:                                 FACILITY:  PHYSICIAN:  Jennell Cornerhomas Tremaine Fuhriman, MD     DATE OF BIRTH:  DATE OF PROCEDURE:  07/17/2017 DATE OF DISCHARGE:                              OPERATIVE REPORT   PREOPERATIVE DIAGNOSIS:  Elective permanent sterilization.  POSTOPERATIVE DIAGNOSIS:  Elective permanent sterilization.  PROCEDURE PERFORMED: 1. Removal of intrauterine device. 2. Laparoscopic bilateral tubal ligation with Falope rings.  SURGEON:  Jennell Cornerhomas Shaheed Schmuck, MD  ANESTHESIA:  General endotracheal anesthesia.  INDICATION:  A 32 year old, gravida 1, para 1 patient elects for permanent sterilization.  She currently is using an IUD for contraception and wishes this to be removed.  DESCRIPTION OF PROCEDURE:  After adequate general endotracheal anesthesia, the patient was placed in the dorsal lithotomy position. The patient's legs were [facility] in the [facility] stirrups.  Abdominal and vaginal prep were performed.  The patient was sterilely draped.  Time- out was performed.  Straight catheterization of the bladder yielded 20 mL clear urine.  Speculum was placed into the vagina and the IUD strings were identified and grasped with the ring forceps and the IUD was removed without difficulty.  Single-tooth tenaculum was applied to the anterior cervix and Kahn cannula was placed in the endocervical canal to be used for uterine manipulation during the procedure.  Gloves were changed.  Attention was directed to the patient's abdomen.  A 5 mm infraumbilical incision was made after injecting with 0.5% Marcaine. The laparoscope was advanced into the abdominal cavity under direct visualization with the Optiview cannula.  Once gaining access to the abdomen, the patient's abdomen was insufflated with carbon dioxide. Second port site was placed 2 cm superior to the symphysis pubis.  The Falope  ring trocar was advanced into the abdominal cavity under direct visualization.  Attention was directed to the patient's right fallopian tube and the fallopian tube was grasped at the isthmic ampullary portion of fallopian tube and a Falope ring was applied with a resulting 1.5 cm portion of fallopian tube within the knuckle.  Similar procedure was repeated on the patient's left fallopian tube after identifying the fimbriated end.  A Falope ring was applied at the isthmic ampullary portion of the fallopian tube with a 1.5 cm portion of fallopian tube resulting.  Slightly dilated left infundibulopelvic vessels were noted. The left ovary was prominent, but no definitive cyst in the upper abdomen appeared normal.  The patient's abdomen was deflated and each incision was closed with interrupted 4-0 Vicryl suture.  Sterile dressing applied.  Kahn cannula was removed with single-tooth tenaculum. There were no complications.  ESTIMATED BLOOD LOSS:  Minimal.  INTRAOPERATIVE FLUIDS:  900 mL.  The patient tolerated the procedure well and was taken to recovery room in good condition.    ______________________________ Jennell Cornerhomas Dam Ashraf, MD   ______________________________ Jennell Cornerhomas Jianni Batten, MD    TS/MEDQ  D:  07/17/2017  T:  07/17/2017  Job:  [id number]

## 2017-11-04 ENCOUNTER — Encounter: Payer: Self-pay | Admitting: Emergency Medicine

## 2017-11-04 ENCOUNTER — Other Ambulatory Visit: Payer: Self-pay

## 2017-11-04 ENCOUNTER — Emergency Department
Admission: EM | Admit: 2017-11-04 | Discharge: 2017-11-04 | Disposition: A | Discharge status: Home / Self Care | Payer: BLUE CROSS/BLUE SHIELD | Attending: Emergency Medicine | Admitting: Emergency Medicine

## 2017-11-04 DIAGNOSIS — Z79899 Other long term (current) drug therapy: Secondary | ICD-10-CM | POA: Insufficient documentation

## 2017-11-04 DIAGNOSIS — K047 Periapical abscess without sinus: Secondary | ICD-10-CM | POA: Insufficient documentation

## 2017-11-04 DIAGNOSIS — K0889 Other specified disorders of teeth and supporting structures: Secondary | ICD-10-CM | POA: Diagnosis present

## 2017-11-04 MED ORDER — LIDOCAINE-EPINEPHRINE 2 %-1:100000 IJ SOLN
1.7000 mL | Freq: Once | INTRAMUSCULAR | Status: AC
Start: 2017-11-04 — End: 2017-11-04
  Administered 2017-11-04: 1.7 mL via INTRADERMAL

## 2017-11-04 MED ORDER — AMOXICILLIN 500 MG PO CAPS
500.0000 mg | ORAL_CAPSULE | Freq: Once | ORAL | Status: AC
  Administered 2017-11-04: 500 mg via ORAL
  Filled 2017-11-04: qty 1

## 2017-11-04 MED ORDER — ONDANSETRON 4 MG PO TBDP
4.0000 mg | ORAL_TABLET | Freq: Once | ORAL | Status: AC
  Administered 2017-11-04: 4 mg via ORAL
  Filled 2017-11-04: qty 1

## 2017-11-04 MED ORDER — AMOXICILLIN 500 MG PO TABS: 500.0000 mg | ORAL_TABLET | Freq: Three times a day (TID) | ORAL | 0 refills | Status: DC

## 2017-11-04 MED ORDER — LIDOCAINE-EPINEPHRINE 2 %-1:100000 IJ SOLN
INTRAMUSCULAR | Status: AC
  Filled 2017-11-04: qty 1.7

## 2017-11-04 MED ORDER — HYDROCODONE-ACETAMINOPHEN 5-325 MG PO TABS: 1.0000 | ORAL_TABLET | Freq: Four times a day (QID) | ORAL | 0 refills | Status: DC | PRN

## 2017-11-04 MED ORDER — NAPROXEN 500 MG PO TABS: 500.0000 mg | ORAL_TABLET | Freq: Two times a day (BID) | ORAL | 0 refills | Status: DC

## 2017-11-04 NOTE — ED Notes (Signed)
Pt states pain where a tooth broke off in her mouth.

## 2017-11-04 NOTE — ED Triage Notes (Signed)
Patient ambulatory to triage with steady gait, without difficulty or distress noted; pt reports left upper tooth broke off; unable to get appt at dentist til next wk and having persistent pain; taking tylenol without relief

## 2017-11-04 NOTE — ED Provider Notes (Signed)
[facility] Emergency Department Provider Note ____________________________________________  Time seen: Approximately 11:03 PM  I have reviewed the triage vital signs and the nursing notes.   HISTORY  Chief Complaint No chief complaint on file.   HPI Toni Robertson is a 32 y.o. female who presents to the emergency department for treatment and evaluation of dental pain.   Pain started last night.  She has noticed some swelling in the gum above a broken tooth.  She scheduled an appointment with her dentist, but he is unable to see her until next Thursday.  No relief with Tylenol.  Pain is causing nausea and headache as well.  Past Medical History:  Diagnosis Date  . Family history of adverse reaction to anesthesia    MATERNAL GRANDFATHER-HARD TO WAKE UP  . Fibromyalgia   . Headache    MIGRAINE  . History of kidney stones 06/2017  . History of methicillin resistant staphylococcus aureus (MRSA) 2013    There are no active problems to display for this patient.   Past Surgical History:  Procedure Laterality Date  . IUD REMOVAL N/A 07/17/2017   Procedure: INTRAUTERINE DEVICE (IUD) REMOVAL;  Surgeon: Schermerhorn, Ihor Austin, MD;  Location: [facility] ORS;  Service: Gynecology;  Laterality: N/A;  . LAPAROSCOPIC TUBAL LIGATION Bilateral 07/17/2017   Procedure: LAPAROSCOPIC TUBAL LIGATION;  Surgeon: Feliberto Gottron, Ihor Austin, MD;  Location: [facility] ORS;  Service: Gynecology;  Laterality: Bilateral;  . NO PAST SURGERIES      Prior to Admission medications   Medication Sig Start Date End Date Taking? Authorizing Provider  amoxicillin (AMOXIL) 500 MG tablet Take 1 tablet (500 mg total) by mouth 3 (three) times daily. 11/04/17   Ovid Witman, Rulon Eisenmenger B, FNP  DULoxetine (CYMBALTA) 20 MG capsule Take 20 mg by mouth daily.    [provider]  HYDROcodone-acetaminophen (NORCO) 5-325 MG tablet Take 1 tablet by mouth every 6 (six) hours as needed for moderate pain. 11/04/17   Arlin Savona,  Rulon Eisenmenger B, FNP  ibuprofen (ADVIL,MOTRIN) 800 MG tablet Take 1 tablet (800 mg total) by mouth every 8 (eight) hours as needed. 06/29/17   Nita Sickle, MD  levonorgestrel [facility]) 20 MCG/24HR IUD 1 each by Intrauterine route once.    [provider]  naproxen (NAPROSYN) 500 MG tablet Take 1 tablet (500 mg total) by mouth 2 (two) times daily with a meal. 11/04/17   Lounette Sloan B, FNP  ondansetron (ZOFRAN) 4 MG tablet Take 1 tablet (4 mg total) by mouth every 8 (eight) hours as needed for nausea or vomiting. Patient not taking: Reported on 07/17/2017 06/28/17   Jeanmarie Plant, MD  oxyCODONE-acetaminophen (ROXICET) 5-325 MG tablet Take 1 tablet by mouth every 6 (six) hours as needed for severe pain. Patient not taking: Reported on 07/10/2017 06/28/17   Jeanmarie Plant, MD  predniSONE (DELTASONE) 10 MG tablet 5 tablets daily x 3 days, then 4 tablets daily x 3 days, then 3 tablets daily x 3 days, then 2 tablets daily x 3 days, then 1 tablet daily x 3 days. Patient not taking: Reported on 07/02/2017 05/05/17   Tommie Sams, DO    Allergies Coconut flavor and Azithromycin  No family history on file.  Social History Social History   Tobacco Use  . Smoking status: Never Smoker  . Smokeless tobacco: Never Used  Substance Use Topics  . Alcohol use: No  . Drug use: No    Review of Systems Constitutional: Positive for dental pain.  Negative for fever. ENT:  Positive for dental pain. Musculoskeletal: Negative for trismus Skin: Positive for swelling in the gum line  ____________________________________________   PHYSICAL EXAM:  VITAL SIGNS: ED Triage Vitals  Enc Vitals Group     BP 11/04/17 2141 (!) 144/99     Pulse Rate 11/04/17 2141 92     Resp 11/04/17 2141 20     Temp 11/04/17 2141 98.2 F (36.8 C)     Temp src --      SpO2 11/04/17 2141 99 %     Weight 11/04/17 2139 289 lb (131.1 kg)     Height 11/04/17 2139 5\' 4"  (1.626 m)     Head Circumference --      Peak  Flow --      Pain Score 11/04/17 2139 10     Pain Loc --      Pain Edu? --      Excl. in GC? --     Constitutional: Alert and oriented. Well appearing and in no acute distress. Eyes: Clear. Mouth/Throat:  Periodontal Exam    Hematological/Lymphatic/Immunilogical: No palpable anterior cervical nodes. Respiratory: Respirations even and unlabored. Musculoskeletal: Full range of motion of jaw Neurologic: Alert, oriented x4. Skin: No facial edema or erythema overlying area of pain. Psychiatric: Affect and behavior seem appropriate.  ____________________________________________   LABS (all labs ordered are listed, but only abnormal results are displayed)  Labs Reviewed - No data to display ____________________________________________   RADIOLOGY  Not indicated ____________________________________________   PROCEDURES  Procedure(s) performed:   .Nerve Block Date/Time: 11/04/2017 11:06 PM Performed by: Chinita Pester, FNP Authorized by: Chinita Pester, FNP   Consent:    Consent obtained:  Verbal   Consent given by:  Patient   Risks discussed:  Pain Indications:    Indications:  Pain relief Location:    Nerve block body site: Mouth. Skin anesthesia (see MAR for exact dosages):    Skin anesthesia method:  None Procedure details (see MAR for exact dosages):    Block needle gauge:  27 G   Anesthetic injected:  Lidocaine 2% WITH epi   Injection procedure:  Anatomic landmarks identified and introduced needle Post-procedure details:    Outcome:  Pain relieved   Patient tolerance of procedure:  Tolerated well, no immediate complications    Critical Care performed: No ____________________________________________   INITIAL IMPRESSION / ASSESSMENT AND PLAN / ED COURSE  Toni Robertson is a 33 y.o. female who presents to the emergency department for treatment and evaluation of dental pain.  While in the ER, she was given a supraperiosteal dental block with  successful pain relief.  She will be prescribed amoxicillin and tramadol.  She will be given a prescription for Zofran as well.  Patient was strongly encouraged to keep her appointment as scheduled with her dentist.  She was encouraged to return to the emergency department for symptoms of change or worsen if she is unable to keep that appointment or schedule an earlier appointment.  Pertinent labs & imaging results that were available during my care of the patient were reviewed by me and considered in my medical decision making (see chart for details).  ____________________________________________   FINAL CLINICAL IMPRESSION(S) / ED DIAGNOSES  Final diagnoses:  Dental abscess    New Prescriptions   AMOXICILLIN (AMOXIL) 500 MG TABLET    Take 1 tablet (500 mg total) by mouth 3 (three) times daily.   HYDROCODONE-ACETAMINOPHEN (NORCO) 5-325 MG TABLET    Take 1 tablet by mouth every 6 (six)  hours as needed for moderate pain.   NAPROXEN (NAPROSYN) 500 MG TABLET    Take 1 tablet (500 mg total) by mouth 2 (two) times daily with a meal.    If controlled substance prescribed during this visit, 12 month history viewed on the [facility] prior to issuing an initial prescription for Schedule II or III opiod.  Note:  This document was prepared using Dragon voice recognition software and may include unintentional dictation errors.    Chinita Pesterriplett, Shaquitta Burbridge B, FNP 11/04/17 2318    Rockne MenghiniNorman, Anne-Caroline, MD 11/10/17 [phone]

## 2018-01-26 ENCOUNTER — Other Ambulatory Visit: Payer: Self-pay

## 2018-01-26 ENCOUNTER — Emergency Department
Admission: EM | Admit: 2018-01-26 | Discharge: 2018-01-26 | Disposition: A | Discharge status: Home / Self Care | Payer: BLUE CROSS/BLUE SHIELD | Attending: Emergency Medicine | Admitting: Emergency Medicine

## 2018-01-26 ENCOUNTER — Emergency Department: Payer: BLUE CROSS/BLUE SHIELD

## 2018-01-26 DIAGNOSIS — F40298 Other specified phobia: Secondary | ICD-10-CM | POA: Diagnosis not present

## 2018-01-26 DIAGNOSIS — R040 Epistaxis: Secondary | ICD-10-CM | POA: Insufficient documentation

## 2018-01-26 DIAGNOSIS — Z79899 Other long term (current) drug therapy: Secondary | ICD-10-CM | POA: Insufficient documentation

## 2018-01-26 DIAGNOSIS — G43909 Migraine, unspecified, not intractable, without status migrainosus: Secondary | ICD-10-CM | POA: Diagnosis not present

## 2018-01-26 DIAGNOSIS — R112 Nausea with vomiting, unspecified: Secondary | ICD-10-CM | POA: Diagnosis not present

## 2018-01-26 DIAGNOSIS — R51 Headache: Secondary | ICD-10-CM | POA: Diagnosis present

## 2018-01-26 DIAGNOSIS — H53149 Visual discomfort, unspecified: Secondary | ICD-10-CM | POA: Diagnosis not present

## 2018-01-26 DIAGNOSIS — R197 Diarrhea, unspecified: Secondary | ICD-10-CM | POA: Insufficient documentation

## 2018-01-26 LAB — COMPREHENSIVE METABOLIC PANEL
ALBUMIN: 3.7 g/dL (ref 3.5–5.0)
ALT: 11 U/L (ref 0–44)
AST: 13 U/L — AB (ref 15–41)
Alkaline Phosphatase: 97 U/L (ref 38–126)
Anion gap: 6 (ref 5–15)
BILIRUBIN TOTAL: 0.6 mg/dL (ref 0.3–1.2)
BUN: 12 mg/dL (ref 6–20)
CALCIUM: 8.8 mg/dL — AB (ref 8.9–10.3)
CO2: 26 mmol/L (ref 22–32)
Chloride: 107 mmol/L (ref 98–111)
Creatinine, Ser: 0.81 mg/dL (ref 0.44–1.00)
GFR calc Af Amer: >60 mL/min (ref >60)
GFR calc non Af Amer: >60 mL/min (ref >60)
GLUCOSE: 104 mg/dL — AB (ref 70–99)
Potassium: 3.9 mmol/L (ref 3.5–5.1)
Sodium: 139 mmol/L (ref 135–145)
TOTAL PROTEIN: 7 g/dL (ref 6.5–8.1)

## 2018-01-26 LAB — CBC
HEMATOCRIT: 37.4 % (ref 35.0–47.0)
HEMOGLOBIN: 12.7 g/dL (ref 12.0–16.0)
MCH: 28.2 pg (ref 26.0–34.0)
MCHC: 33.9 g/dL (ref 32.0–36.0)
MCV: 83.4 fL (ref 80.0–100.0)
Platelets: 339 10*3/uL (ref 150–440)
RBC: 4.48 MIL/uL (ref 3.80–5.20)
RDW: 14.5 % (ref 11.5–14.5)
WBC: 10.6 10*3/uL (ref 3.6–11.0)

## 2018-01-26 IMAGING — CT CT HEAD W/O CM
3 series · 15 of 46 positions shown, 18 images · non-contrast
Comparison: None.

CLINICAL DATA: Headache acute

EXAM:
CT HEAD WITHOUT CONTRAST
TECHNIQUE: Contiguous axial images were obtained from the base of the skull
through the vertex without intravenous contrast.

[Series 2: head wo · axial · 0.40mm/px · z∈[-139,-19]mm · 9 of 29 slices shown, 12 images]
[im 3/29  brain]
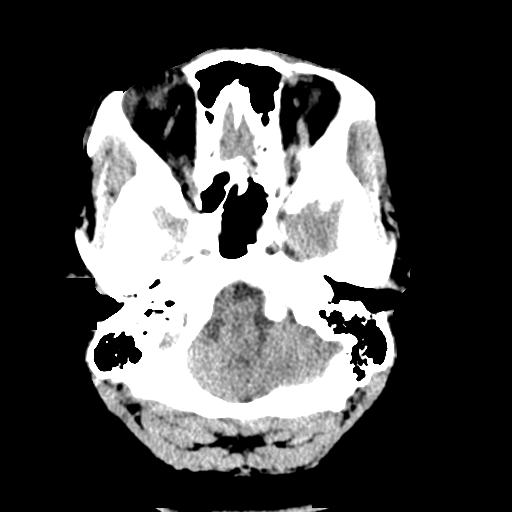
[im 3/29  bone]
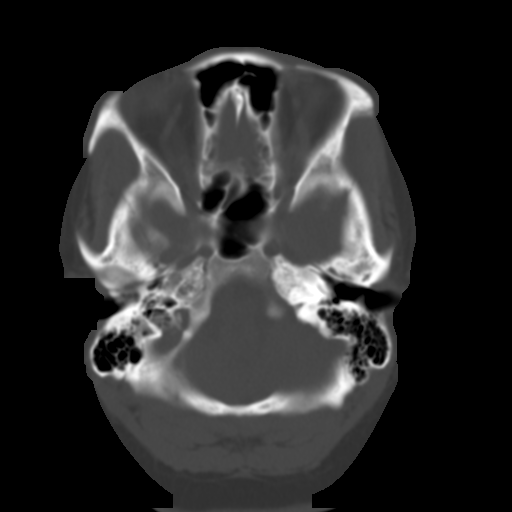
[im 6/29  brain]
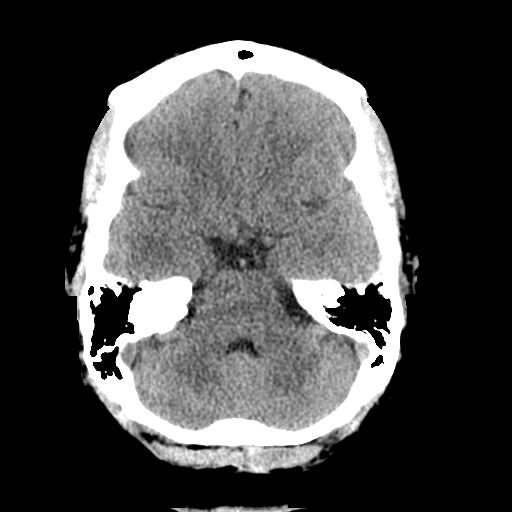
[im 9/29  brain]
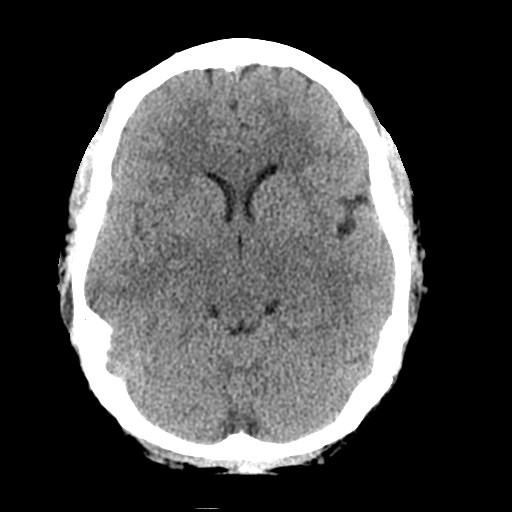
[im 12/29  brain]
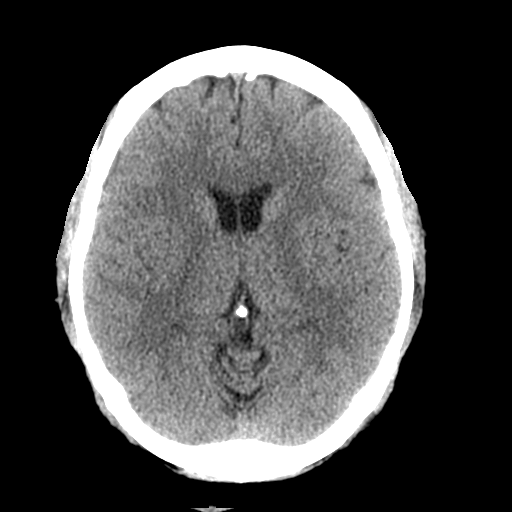
[im 15/29  brain]
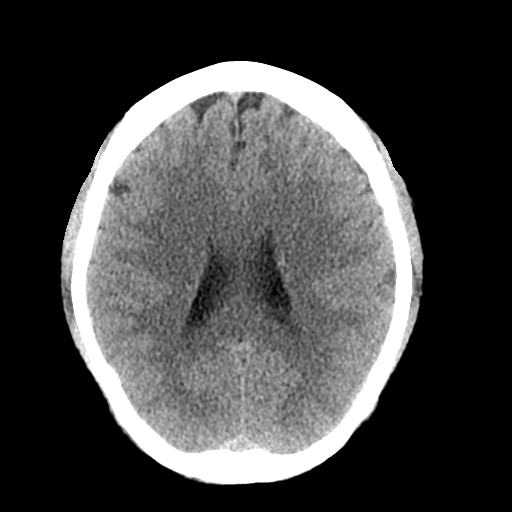
[im 15/29  bone]
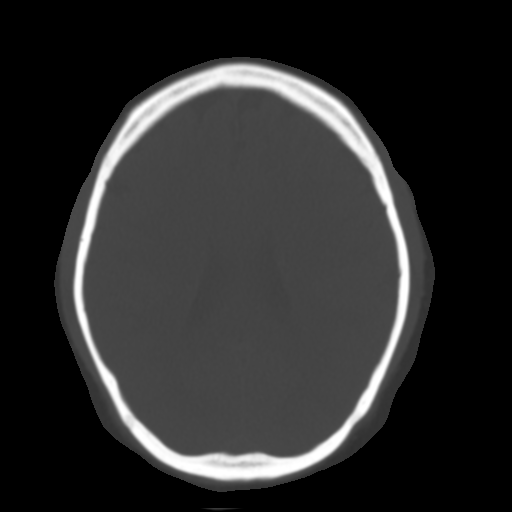
[im 18/29  brain]
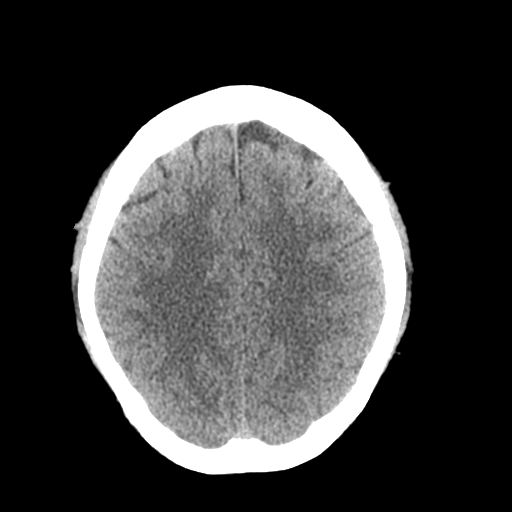
[im 21/29  brain]
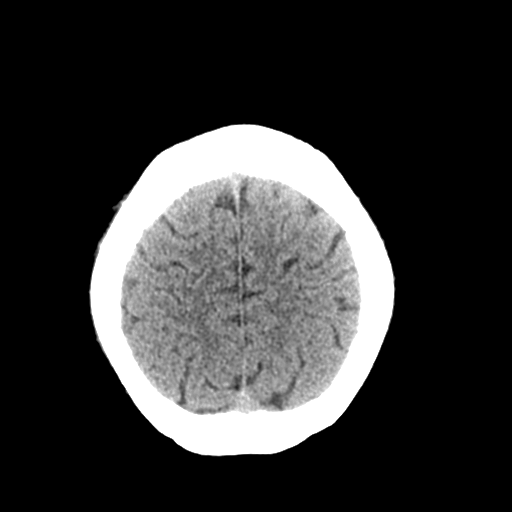
[im 24/29  brain]
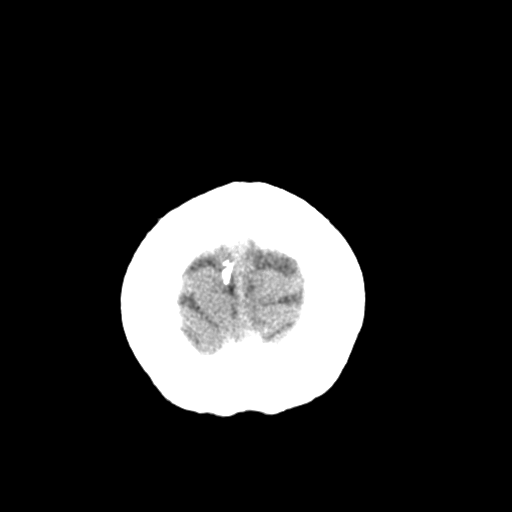
[im 27/29  brain]
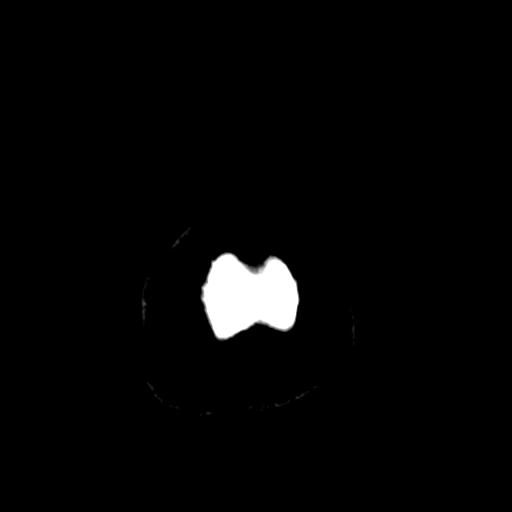
[im 27/29  bone]
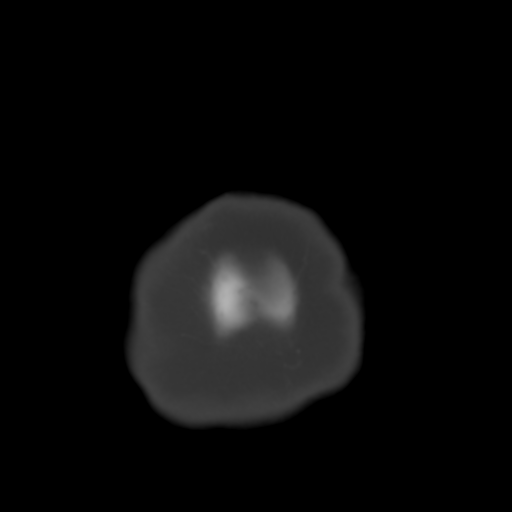

[Series 4: coronal soft tissue · coronal · 0.30mm/px · 3 of 65 slices shown]
[im 22/65  brain]
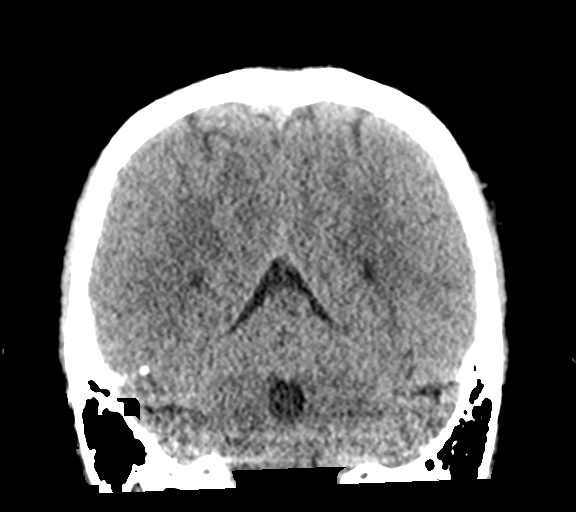
[im 29/65  brain]
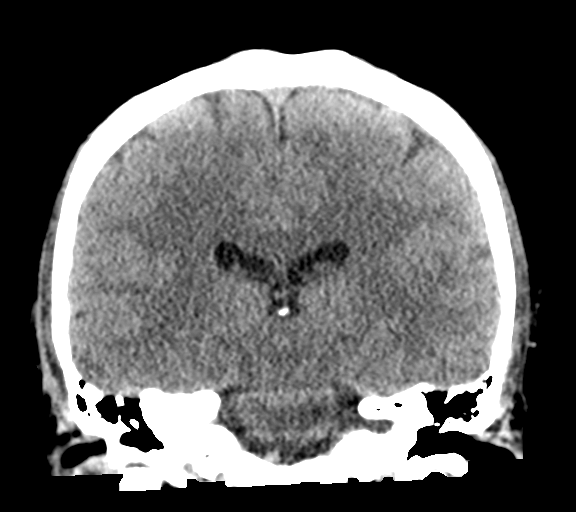
[im 36/65  brain]
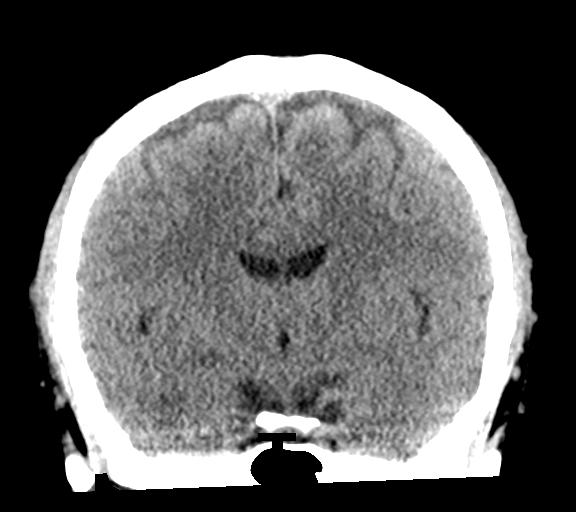

[Series 5: sagittal soft tissue · sagittal · 0.29mm/px · 3 of 54 slices shown]
[im 18/54  brain]
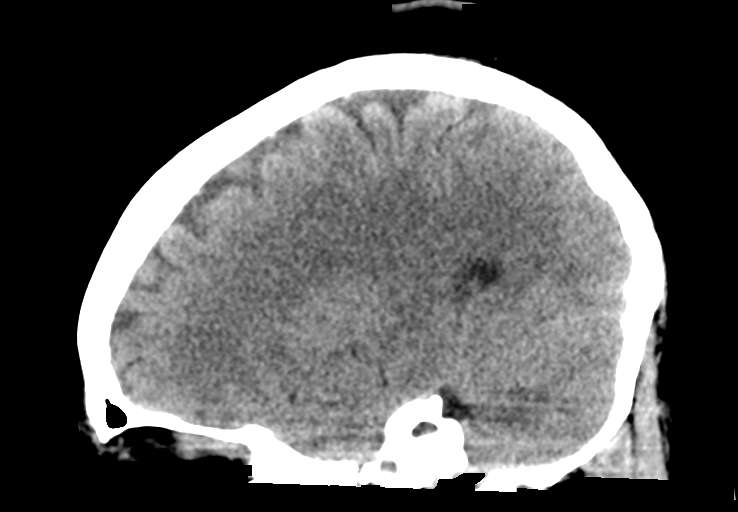
[im 27/54  brain]
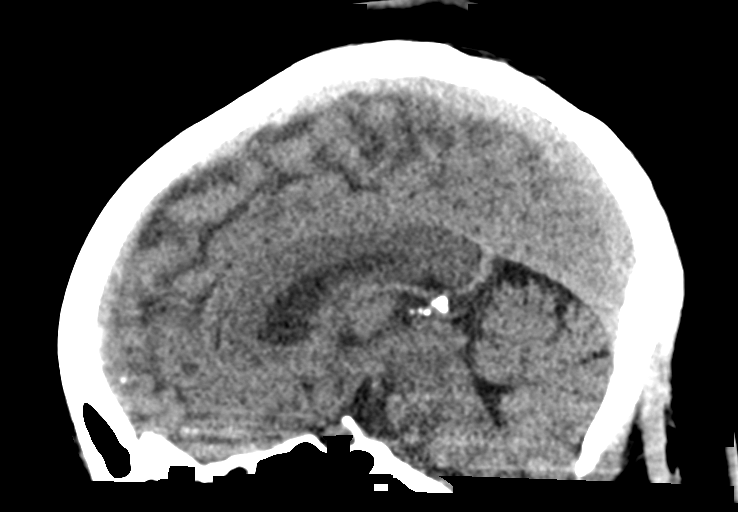
[im 36/54  brain]
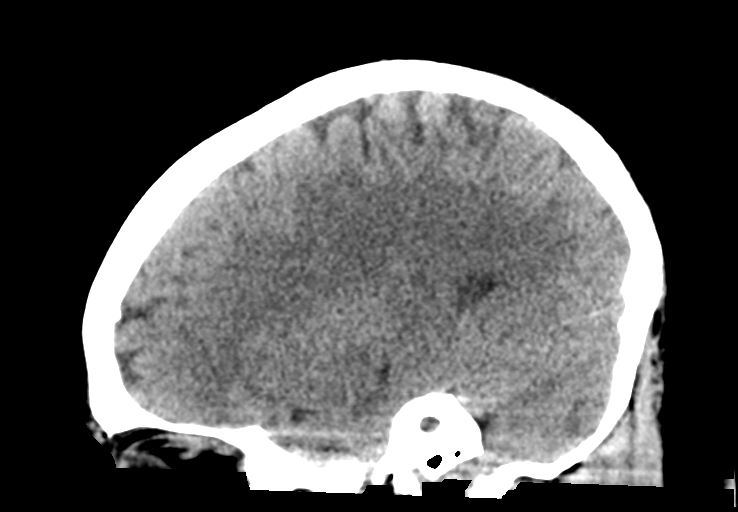

[15 of 46 positions shown; findings below may reference images not displayed]

FINDINGS: Brain: No evidence of acute infarction, hemorrhage, hydrocephalus,
extra-axial collection or mass lesion/mass effect.

Vascular: Negative for hyperdense vessel

Skull: Negative

Sinuses/Orbits: Negative

Other: None
IMPRESSION: Negative CT head

## 2018-01-26 MED ORDER — MORPHINE SULFATE (PF) 4 MG/ML IV SOLN
4.0000 mg | Freq: Once | INTRAVENOUS | Status: AC
  Administered 2018-01-26: 4 mg via INTRAVENOUS
  Filled 2018-01-26: qty 1

## 2018-01-26 MED ORDER — DIPHENHYDRAMINE HCL 50 MG/ML IJ SOLN
50.0000 mg | Freq: Once | INTRAMUSCULAR | Status: AC
  Administered 2018-01-26: 50 mg via INTRAVENOUS
  Filled 2018-01-26: qty 1

## 2018-01-26 MED ORDER — KETOROLAC TROMETHAMINE 30 MG/ML IJ SOLN
30.0000 mg | Freq: Once | INTRAMUSCULAR | Status: AC
  Administered 2018-01-26: 30 mg via INTRAVENOUS
  Filled 2018-01-26: qty 1

## 2018-01-26 MED ORDER — BUTALBITAL-APAP-CAFFEINE 50-325-40 MG PO TABS: 1.0000 | ORAL_TABLET | Freq: Four times a day (QID) | ORAL | 0 refills | Status: DC | PRN

## 2018-01-26 MED ORDER — METOCLOPRAMIDE HCL 5 MG/ML IJ SOLN
10.0000 mg | Freq: Once | INTRAMUSCULAR | Status: AC
  Administered 2018-01-26: 10 mg via INTRAVENOUS
  Filled 2018-01-26: qty 2

## 2018-01-26 MED ORDER — SODIUM CHLORIDE 0.9 % IV BOLUS
1000.0000 mL | Freq: Once | INTRAVENOUS | Status: AC
  Administered 2018-01-26: 1000 mL via INTRAVENOUS

## 2018-01-26 NOTE — ED Notes (Signed)
Pt alert and oriented X4, active, cooperative, pt in NAD. RR even and unlabored, color WNL.  Pt informed to return if any life threatening symptoms occur.  Discharge and followup instructions reviewed.  

## 2018-01-26 NOTE — ED Provider Notes (Signed)
[facility] Emergency Department Provider Note  Time seen: 4:15 PM  I have reviewed the triage vital signs and the nursing notes.   HISTORY  Chief Complaint Migraine    HPI Toni Robertson is a 32 y.o. female with no significant past medical history presents to the emergency department for headache and intermittent nosebleeds.  According to the patient over the past 2 to 3 weeks she has intermittently had nosebleeds, states she has had them previously but never to this frequency area she also states for the past 2 to 3 weeks she has been experiencing a headache described as a dull aching headache, moderate in severity, positive photo and phonophobia.  States she has had several migraines in the past but none of which have lasted several days.  Denies any fever at any point.  No neck pain or stiffness.  States nausea with vomiting this morning but has also been expensing loose stool this morning as well.   Past Medical History:  Diagnosis Date  . Family history of adverse reaction to anesthesia    MATERNAL GRANDFATHER-HARD TO WAKE UP  . Fibromyalgia   . Headache    MIGRAINE  . History of kidney stones 06/2017  . History of methicillin resistant staphylococcus aureus (MRSA) 2013    There are no active problems to display for this patient.   Past Surgical History:  Procedure Laterality Date  . IUD REMOVAL N/A 07/17/2017   Procedure: INTRAUTERINE DEVICE (IUD) REMOVAL;  Surgeon: Schermerhorn, Ihor Austinhomas J, MD;  Location: [facility] ORS;  Service: Gynecology;  Laterality: N/A;  . LAPAROSCOPIC TUBAL LIGATION Bilateral 07/17/2017   Procedure: LAPAROSCOPIC TUBAL LIGATION;  Surgeon: Feliberto GottronSchermerhorn, Ihor Austinhomas J, MD;  Location: [facility] ORS;  Service: Gynecology;  Laterality: Bilateral;  . NO PAST SURGERIES      Prior to Admission medications   Medication Sig Start Date End Date Taking? Authorizing Provider  amoxicillin (AMOXIL) 500 MG tablet Take 1 tablet (500 mg total) by mouth 3  (three) times daily. 11/04/17   Triplett, Rulon Eisenmengerari B, FNP  DULoxetine (CYMBALTA) 20 MG capsule Take 20 mg by mouth daily.    [provider]  HYDROcodone-acetaminophen (NORCO) 5-325 MG tablet Take 1 tablet by mouth every 6 (six) hours as needed for moderate pain. 11/04/17   Triplett, Rulon Eisenmengerari B, FNP  ibuprofen (ADVIL,MOTRIN) 800 MG tablet Take 1 tablet (800 mg total) by mouth every 8 (eight) hours as needed. 06/29/17   Nita SickleVeronese, Gandy, MD  levonorgestrel [facility](MIRENA) 20 MCG/24HR IUD 1 each by Intrauterine route once.    [provider]  naproxen (NAPROSYN) 500 MG tablet Take 1 tablet (500 mg total) by mouth 2 (two) times daily with a meal. 11/04/17   Triplett, Cari B, FNP  ondansetron (ZOFRAN) 4 MG tablet Take 1 tablet (4 mg total) by mouth every 8 (eight) hours as needed for nausea or vomiting. Patient not taking: Reported on 07/17/2017 06/28/17   Jeanmarie PlantMcShane, James A, MD  oxyCODONE-acetaminophen (ROXICET) 5-325 MG tablet Take 1 tablet by mouth every 6 (six) hours as needed for severe pain. Patient not taking: Reported on 07/10/2017 06/28/17   Jeanmarie PlantMcShane, James A, MD  predniSONE (DELTASONE) 10 MG tablet 5 tablets daily x 3 days, then 4 tablets daily x 3 days, then 3 tablets daily x 3 days, then 2 tablets daily x 3 days, then 1 tablet daily x 3 days. Patient not taking: Reported on 07/02/2017 05/05/17   Tommie Samsook, Jayce G, DO    Allergies  Allergen Reactions  . Coconut  Flavor Anaphylaxis  . Azithromycin Itching    No family history on file.  Social History Social History   Tobacco Use  . Smoking status: Never Smoker  . Smokeless tobacco: Never Used  Substance Use Topics  . Alcohol use: No  . Drug use: No    Review of Systems Constitutional: Negative for fever. Eyes: States photophobia Cardiovascular: Negative for chest pain. Respiratory: Negative for shortness of breath. Gastrointestinal: Negative for abdominal pain.  Positive for nausea vomiting this morning with  diarrhea. Genitourinary: Negative for urinary compaints Musculoskeletal: Negative for musculoskeletal complaints Skin: Negative for skin complaints  Neurological: Moderate headache All other ROS negative  ____________________________________________   PHYSICAL EXAM:  VITAL SIGNS: ED Triage Vitals  Enc Vitals Group     BP 01/26/18 1524 133/85     Pulse Rate 01/26/18 1524 88     Resp 01/26/18 1524 15     Temp 01/26/18 1524 98.6 F (37 C)     Temp Source 01/26/18 1524 Oral     SpO2 01/26/18 1524 100 %     Weight 01/26/18 1525 286 lb (129.7 kg)     Height 01/26/18 1525 5\' 4"  (1.626 m)     Head Circumference --      Peak Flow --      Pain Score 01/26/18 1524 9     Pain Loc --      Pain Edu? --      Excl. in GC? --     Constitutional: Alert and oriented. Well appearing and in no distress. Eyes: Mild photophobia on exam, pupils to 3 mm equal and reactive ENT   Head: Normocephalic and atraumatic.  Normal-appearing nose, no blood, no signs of epistaxis, lesions, or hematoma.   Mouth/Throat: Mucous membranes are moist. Cardiovascular: Normal rate, regular rhythm. No murmur Respiratory: Normal respiratory effort without tachypnea nor retractions. Breath sounds are clear Gastrointestinal: Soft and nontender. No distention.   Musculoskeletal: Nontender with normal range of motion in all extremities.  Neurologic:  Normal speech and language. No gross focal neurologic deficits  Skin:  Skin is warm, dry and intact.  Psychiatric: Mood and affect are normal.  ____________________________________________   INITIAL IMPRESSION / ASSESSMENT AND PLAN / ED COURSE  Pertinent labs & imaging results that were available during my care of the patient were reviewed by me and considered in my medical decision making (see chart for details).  Patient presents emergency department for 2 to 3 weeks of headache as well as nosebleeds.  Differential would include epistaxis, thrombocytopenia,  migraine.  Reassuringly patient has a normal neurological exam, denies any fever, afebrile with normal vitals in the emergency department.  We will treat with Toradol, Reglan Benadryl and IV fluids, check labs and continue to closely monitor.  Patient agreeable to this plan of care.  ----------------------------------------- 5:01 PM on 01/26/2018 -----------------------------------------  Patient states worsening throbbing in her head, states she has never had to go to emergency department for a headache before.  We will obtain CT imaging of the head as a precaution and treat with pain medication.  Patient agreeable to plan of care.  ----------------------------------------- 5:58 PM on 01/26/2018 -----------------------------------------  CT scan is negative.  Patient states she feels much better.  States she is tired.  We will discharge with prescription of Fioricet and have the patient follow-up with her doctor.  Patient agreeable to plan of care.  ____________________________________________   FINAL CLINICAL IMPRESSION(S) / ED DIAGNOSES  Headache Epistaxis    Minna Antis, MD  01/26/18 1758  

## 2018-01-26 NOTE — ED Triage Notes (Signed)
Pt reports migraines off and on for the past two weeks with vomiting - she also reports off and on nose bleeds

## 2018-01-26 NOTE — ED Notes (Signed)
Patient transported to CT 

## 2018-03-11 ENCOUNTER — Encounter: Payer: Self-pay | Admitting: Dietician

## 2018-03-11 ENCOUNTER — Encounter: Payer: BLUE CROSS/BLUE SHIELD | Attending: Family Medicine | Admitting: Dietician

## 2018-03-11 NOTE — Progress Notes (Signed)
Medical Nutrition Therapy: Visit start time: 1115 end time: 1225  Assessment:  Diagnosis: morbid obesity Past medical history: anxiety, migraines Psychosocial issues/ stress concerns:  Patient rates her stress as high related to her job; sometimes works 7 days week; also is stressed regarding wedding plans for 03/2019. PHQ depression score of "8" indicated mild depression but she states that the Bupropion increase is helping.  Preferred learning method:  . Hands-on Current weight: 296.7 lbs Height: 64 in Medications, supplements: see list  Progress and evaluation:  Patient in for initial medical nutrition therapy follow-up. She gives a weight goal of 185 lbs which she weighed 2 years ago. She states her weight gain accelerated after she started working 3rd shift a [employer] 1 year ago. She works 7 day weeks often and has an erratic meal schedule. She reports a high stress level related to her work, raising a 32 year old who plays "travel ball" and is planning her wedding for next September. She states that her fiance is very supportive regarding her efforts to eat healthier. She sometimes eats breakfast after work but often goes to bed without eating until 1:30pm. She then eats a meal consisting with meat (beef or chicken) and starch.  She doesn't eat a meal again until 3:00am which is usually a fast food meal Ex. Cheeseburger/fries/soda or chicken tenders/soda or pizza/soda. Her beverages are soda, lemon or limeaid and water. She states "I love bread and sweets". Her current diet is low in fruits, vegetables, fiber, and calcium sources.   Physical activity: none   Nutrition Care Education:  Weight control: Instructed on a meal plan based on 1900 calories including carbohydrate counting and how to better balance carbohydrate, protein and non-starchy vegetables. Discussed at length, importance of using meal plan as a guide to help her become more mindful of her food choices verses an inflexible  plan.  Discussed realistic positive steps to take based on her food preferences. Used food guide plate and food models to teach. Discussed establishing a more consistent pattern of meals and snacks which she feels is realistic especially when she begins a new work schedule next month. Discussed how incorporating exercise typically makes it easier to work on [employer] food choices.  Nutritional Diagnosis:  Gonzales-3.3 Overweight/obesity As related to high intake of sweetened beverages, high fat foods, erratic meal schedule, no structured exercise.  As evidenced by diet and exercise history and BMI of 50.9.  Intervention:  Establish a more consistent pattern of meals and snacks of 3 meals and 2-3 small snacks. Use food guide plate as a guide. Limit sweetened beverages to 1 (20 oz) [employer] Coke. Increase water intake and can also include sugar free flavored water. Balance meals with protein, 3-4 servings of carbohydrate and non-starchy vegetables. Increase intake of non-starchy vegetables. When schedule changes - exercise goal - 4 days per week for 1 hour.  Education Materials given:  . Plate Planner . Food lists/ Planning A Balanced Meal . Sample meal pattern/ menus . Goals/ instructions  Learner/ who was taught:  . Patient  Level of understanding: . Partial understanding; needs review/ practice Demonstrated degree of understanding via:   Teach back Learning barriers: . None Willingness to learn/ readiness for change: . Acceptance, ready for change  Monitoring and Evaluation:   [name] did not schedule a follow-up appointment. I encouraged her to call if she would like further help with her diet/nutrition.

## 2018-03-11 NOTE — Patient Instructions (Addendum)
Establish a more consistent pattern of meals and snacks of 3 meals and 2-3 small snacks. Use food guide plate as a guide. Limit sweetened beverages to 1 (20 oz) [employer] Coke. Increase water intake and can also include sugar free flavored water. Balance meals with protein, 3-4 servings of carbohydrate and non-starchy vegetables. Increase intake of non-starchy vegetables. When schedule changes - exercise goal - 4 days per week for 1 hour.

## 2018-04-22 DIAGNOSIS — R7303 Prediabetes: Secondary | ICD-10-CM | POA: Insufficient documentation

## 2018-07-11 ENCOUNTER — Other Ambulatory Visit: Payer: Self-pay

## 2018-07-11 ENCOUNTER — Emergency Department: Payer: BLUE CROSS/BLUE SHIELD

## 2018-07-11 ENCOUNTER — Emergency Department
Admission: EM | Admit: 2018-07-11 | Discharge: 2018-07-11 | Disposition: A | Discharge status: Home / Self Care | Payer: BLUE CROSS/BLUE SHIELD | Attending: Emergency Medicine | Admitting: Emergency Medicine

## 2018-07-11 ENCOUNTER — Encounter: Payer: Self-pay | Admitting: *Deleted

## 2018-07-11 DIAGNOSIS — Z79899 Other long term (current) drug therapy: Secondary | ICD-10-CM | POA: Diagnosis not present

## 2018-07-11 DIAGNOSIS — R070 Pain in throat: Secondary | ICD-10-CM | POA: Diagnosis present

## 2018-07-11 DIAGNOSIS — J029 Acute pharyngitis, unspecified: Secondary | ICD-10-CM | POA: Insufficient documentation

## 2018-07-11 DIAGNOSIS — J0100 Acute maxillary sinusitis, unspecified: Secondary | ICD-10-CM | POA: Insufficient documentation

## 2018-07-11 DIAGNOSIS — R040 Epistaxis: Secondary | ICD-10-CM | POA: Diagnosis not present

## 2018-07-11 LAB — PROTIME-INR
INR: 0.97
Prothrombin Time: 12.8 seconds (ref 11.4–15.2)

## 2018-07-11 LAB — CBC
HCT: 40.4 % (ref 36.0–46.0)
HEMOGLOBIN: 12.8 g/dL (ref 12.0–15.0)
MCH: 26.5 pg (ref 26.0–34.0)
MCHC: 31.7 g/dL (ref 30.0–36.0)
MCV: 83.6 fL (ref 80.0–100.0)
Platelets: 351 10*3/uL (ref 150–400)
RBC: 4.83 MIL/uL (ref 3.87–5.11)
RDW: 13.9 % (ref 11.5–15.5)
WBC: 9.7 10*3/uL (ref 4.0–10.5)
nRBC: 0 % (ref 0.0–0.2)

## 2018-07-11 LAB — COMPREHENSIVE METABOLIC PANEL
ALK PHOS: 96 U/L (ref 38–126)
ALT: 14 U/L (ref 0–44)
AST: 14 U/L — ABNORMAL LOW (ref 15–41)
Albumin: 3.9 g/dL (ref 3.5–5.0)
Anion gap: 6 (ref 5–15)
BUN: 19 mg/dL (ref 6–20)
CALCIUM: 9 mg/dL (ref 8.9–10.3)
CO2: 26 mmol/L (ref 22–32)
Chloride: 106 mmol/L (ref 98–111)
Creatinine, Ser: 0.93 mg/dL (ref 0.44–1.00)
GFR calc Af Amer: >60 mL/min (ref >60)
GFR calc non Af Amer: >60 mL/min (ref >60)
GLUCOSE: 102 mg/dL — AB (ref 70–99)
Potassium: 3.9 mmol/L (ref 3.5–5.1)
SODIUM: 138 mmol/L (ref 135–145)
Total Bilirubin: 0.4 mg/dL (ref 0.3–1.2)
Total Protein: 7.3 g/dL (ref 6.5–8.1)

## 2018-07-11 LAB — POCT PREGNANCY, URINE: PREG TEST UR: NEGATIVE

## 2018-07-11 LAB — GROUP A STREP BY PCR: Group A Strep by PCR: NOT DETECTED

## 2018-07-11 LAB — URINALYSIS, COMPLETE (UACMP) WITH MICROSCOPIC
Bacteria, UA: NONE SEEN
Bilirubin Urine: NEGATIVE
GLUCOSE, UA: NEGATIVE mg/dL
Ketones, ur: NEGATIVE mg/dL
Leukocytes, UA: NEGATIVE
Nitrite: NEGATIVE
PROTEIN: NEGATIVE mg/dL
SPECIFIC GRAVITY, URINE: 1.026 (ref 1.005–1.030)
pH: 5 (ref 5.0–8.0)

## 2018-07-11 LAB — INFLUENZA PANEL BY PCR (TYPE A & B)
Influenza A By PCR: NEGATIVE
Influenza B By PCR: NEGATIVE

## 2018-07-11 LAB — LIPASE, BLOOD: Lipase: 23 U/L (ref 11–51)

## 2018-07-11 IMAGING — CR DG CHEST 2V
2 series · 2 of 2 positions shown · non-contrast
Comparison: Chest radiograph [DATE]

CLINICAL DATA: Hematemesis

EXAM:
CHEST - 2 VIEW

[chest pa]
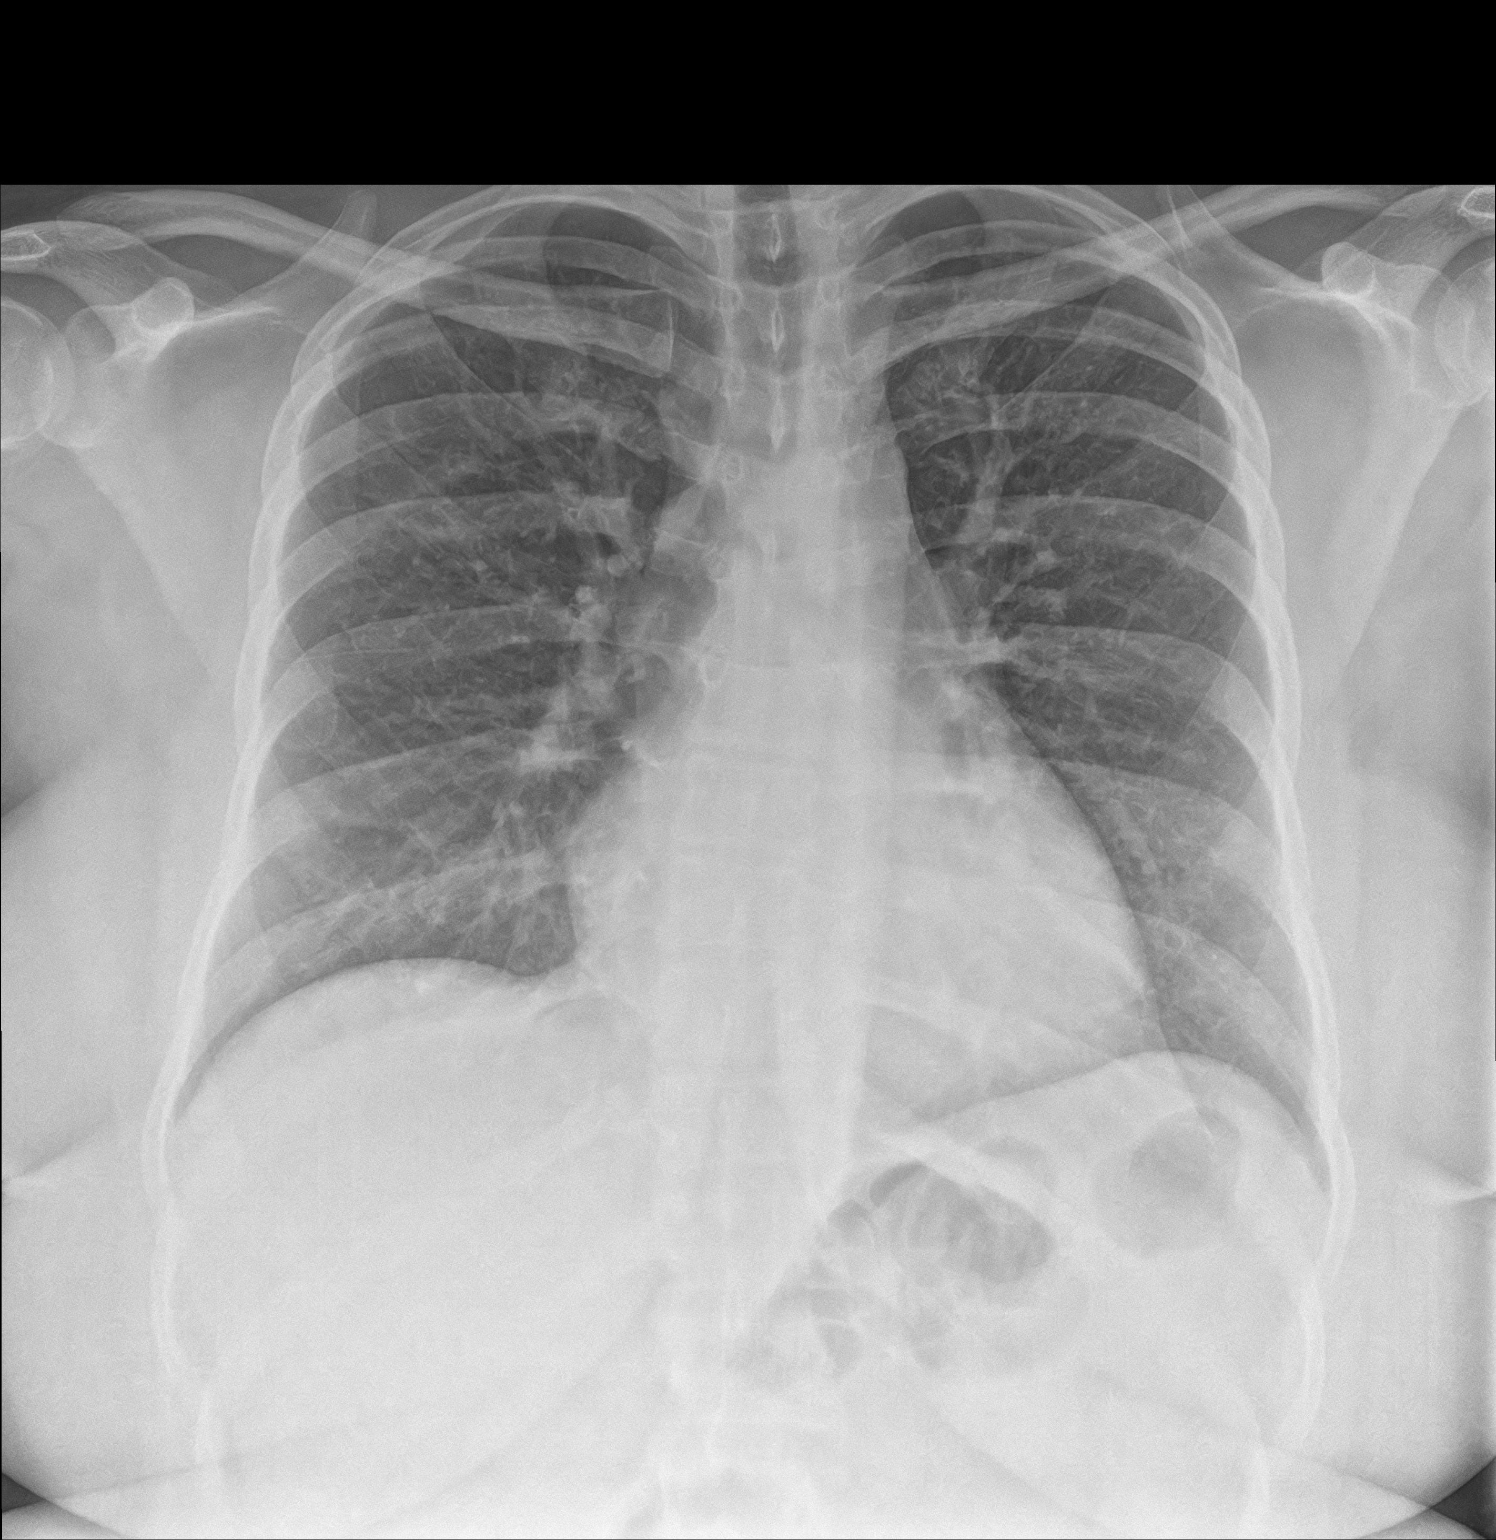

[chest lat]
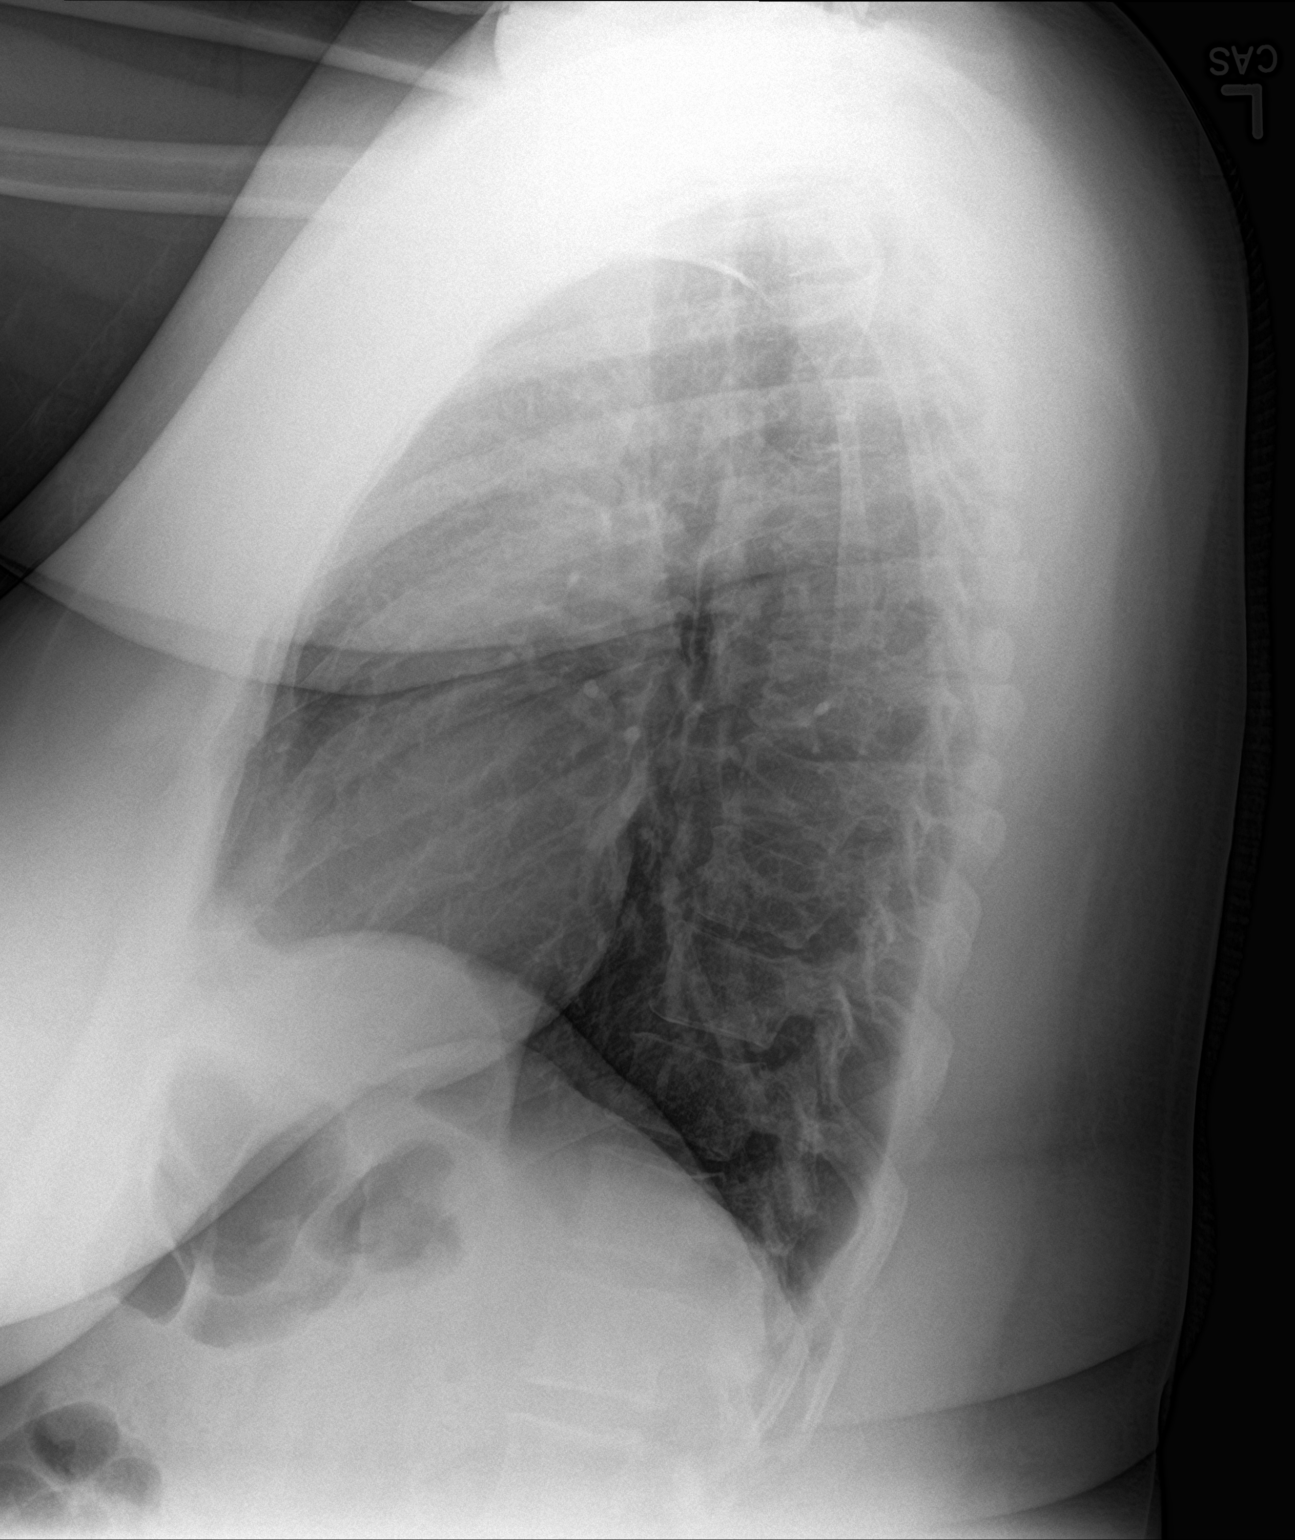

[2 of 2 positions shown; findings below may reference images not displayed]

FINDINGS: Stable cardiomegaly. No consolidative pulmonary opacities. No
pleural effusion or pneumothorax. Thoracic spine degenerative
changes.
IMPRESSION: No acute cardiopulmonary process.

## 2018-07-11 MED ORDER — ONDANSETRON HCL 4 MG PO TABS: 4.0000 mg | ORAL_TABLET | Freq: Every day | ORAL | 0 refills | Status: DC | PRN

## 2018-07-11 MED ORDER — ACETAMINOPHEN 500 MG PO TABS
1000.0000 mg | ORAL_TABLET | Freq: Once | ORAL | Status: AC
  Administered 2018-07-11: 1000 mg via ORAL
  Filled 2018-07-11: qty 2

## 2018-07-11 MED ORDER — AMOXICILLIN-POT CLAVULANATE 875-125 MG PO TABS: 1.0000 | ORAL_TABLET | Freq: Two times a day (BID) | ORAL | 0 refills | Status: AC

## 2018-07-11 MED ORDER — FLUTICASONE PROPIONATE 50 MCG/ACT NA SUSP: 2.0000 | Freq: Every day | NASAL | 0 refills | Status: DC

## 2018-07-11 MED ORDER — LIDOCAINE VISCOUS HCL 2 % MT SOLN: 10.0000 mL | OROMUCOSAL | 0 refills | Status: DC | PRN

## 2018-07-11 MED ORDER — ONDANSETRON HCL 4 MG/2ML IJ SOLN
4.0000 mg | Freq: Once | INTRAMUSCULAR | Status: AC
  Administered 2018-07-11: 4 mg via INTRAVENOUS
  Filled 2018-07-11: qty 2

## 2018-07-11 MED ORDER — SODIUM CHLORIDE 0.9 % IV BOLUS
1000.0000 mL | Freq: Once | INTRAVENOUS | Status: AC
  Administered 2018-07-11: 1000 mL via INTRAVENOUS

## 2018-07-11 NOTE — ED Notes (Signed)
Patient observed waiting for treatment room in wheelchair, no vomiting noted or acute distress.

## 2018-07-11 NOTE — ED Provider Notes (Signed)
[facility] Emergency Department Provider Note  ____________________________________________  Time seen: Approximately 7:20 AM  I have reviewed the triage vital signs and the nursing notes.   HISTORY  Chief Complaint Sore Throat    HPI Toni Robertson is a 32 y.o. female that presents to the  emergency department for evaluation of nasal congestion, nosebleeds, sore throat, productive cough, diarrhea for 1 week and vomiting with streaks of blood this morning.  Patient states that she has a history of nosebleeds and has had nosebleeds since she had a nasal injury when she was 19.  She states that she has chronic problems with postnasal drip and this has proceeded to cause her to vomit her entire life.  She states that postnasal drip and coughing so hard this morning, it caused her to vomit.  When she vomited, she noticed a couple of streaks of blood in the vomit.  It was enough blood just to graze the bottom of a Dixie cup.  She has had several nose bleeds the last couple of days.  Her throat feels like tonsillitis that she has had in the past.  Her throat pain is the worst symptom.  She had several episodes of diarrhea yesterday.  She may have had blood in her diarrhea as well but she is just starting her menstrual period.  She has never had a gastric ulcer or any of GI bleed. She has a history of kidney stones. No shortness of breath, abdominal pain, dysuria, hematuria. No fever.  Past Medical History:  Diagnosis Date  . Family history of adverse reaction to anesthesia    MATERNAL GRANDFATHER-HARD TO WAKE UP  . Fibromyalgia   . Headache    MIGRAINE  . History of kidney stones 06/2017  . History of methicillin resistant staphylococcus aureus (MRSA) 2013    There are no active problems to display for this patient.   Past Surgical History:  Procedure Laterality Date  . IUD REMOVAL N/A 07/17/2017   Procedure: INTRAUTERINE DEVICE (IUD) REMOVAL;  Surgeon:  Schermerhorn, Ihor Austinhomas J, MD;  Location: [facility] ORS;  Service: Gynecology;  Laterality: N/A;  . LAPAROSCOPIC TUBAL LIGATION Bilateral 07/17/2017   Procedure: LAPAROSCOPIC TUBAL LIGATION;  Surgeon: Feliberto GottronSchermerhorn, Ihor Austinhomas J, MD;  Location: [facility] ORS;  Service: Gynecology;  Laterality: Bilateral;  . NO PAST SURGERIES      Prior to Admission medications   Medication Sig Start Date End Date Taking? Authorizing Provider  amoxicillin (AMOXIL) 500 MG tablet Take 1 tablet (500 mg total) by mouth 3 (three) times daily. Patient not taking: Reported on 03/11/2018 11/04/17   Kem Boroughsriplett, Cari B, FNP  amoxicillin-clavulanate (AUGMENTIN) 875-125 MG tablet Take 1 tablet by mouth 2 (two) times daily for 10 days. 07/11/18 07/21/18  Enid DerryWagner, Creg Gilmer, PA-C  buPROPion (WELLBUTRIN XL) 300 MG 24 hr tablet Take by mouth. 01/28/18   [provider]  butalbital-acetaminophen-caffeine (FIORICET, ESGIC) 50-325-40 MG tablet Take 1-2 tablets by mouth every 6 (six) hours as needed for headache. Patient not taking: Reported on 03/11/2018 01/26/18 01/26/19  Minna AntisPaduchowski, Kevin, MD  DULoxetine (CYMBALTA) 20 MG capsule Take 20 mg by mouth daily.    [provider]  fluticasone (FLONASE) 50 MCG/ACT nasal spray Place 2 sprays into both nostrils daily. 07/11/18 07/11/19  Enid DerryWagner, Britain Anagnos, PA-C  HYDROcodone-acetaminophen (NORCO) 5-325 MG tablet Take 1 tablet by mouth every 6 (six) hours as needed for moderate pain. Patient not taking: Reported on 03/11/2018 11/04/17   Kem Boroughsriplett, Cari B, FNP  ibuprofen (ADVIL,MOTRIN) 800 MG tablet Take  1 tablet (800 mg total) by mouth every 8 (eight) hours as needed. Patient not taking: Reported on 03/11/2018 06/29/17   Nita SickleVeronese, Lyons, MD  levonorgestrel [facility](MIRENA) 20 MCG/24HR IUD 1 each by Intrauterine route once.    [provider]  lidocaine (XYLOCAINE) 2 % solution Use as directed 10 mLs in the mouth or throat as needed for mouth pain. 07/11/18   Enid DerryWagner, Shareen Capwell, PA-C  naproxen (NAPROSYN) 500 MG  tablet Take 1 tablet (500 mg total) by mouth 2 (two) times daily with a meal. Patient not taking: Reported on 03/11/2018 11/04/17   Chinita Pesterriplett, Cari B, FNP  ondansetron (ZOFRAN) 4 MG tablet Take 1 tablet (4 mg total) by mouth daily as needed for nausea or vomiting. 07/11/18 07/11/19  Enid DerryWagner, Bodhi Stenglein, PA-C  oxyCODONE-acetaminophen (ROXICET) 5-325 MG tablet Take 1 tablet by mouth every 6 (six) hours as needed for severe pain. Patient not taking: Reported on 07/10/2017 06/28/17   Jeanmarie PlantMcShane, James A, MD  predniSONE (DELTASONE) 10 MG tablet 5 tablets daily x 3 days, then 4 tablets daily x 3 days, then 3 tablets daily x 3 days, then 2 tablets daily x 3 days, then 1 tablet daily x 3 days. Patient not taking: Reported on 07/02/2017 05/05/17   Tommie Samsook, Jayce G, DO    Allergies Coconut flavor and Azithromycin  History reviewed. No pertinent family history.  Social History Social History   Tobacco Use  . Smoking status: Never Smoker  . Smokeless tobacco: Never Used  Substance Use Topics  . Alcohol use: No  . Drug use: No     Review of Systems  Constitutional: No fever/chills ENT: Positive for nasal congestion. Positive for sore throat. Cardiovascular: No chest pain. Respiratory: Positive for cough. No SOB. Gastrointestinal: No abdominal pain.  Musculoskeletal: Negative for musculoskeletal pain. Skin: Negative for rash, abrasions, lacerations, ecchymosis. Neurological: Negative for headaches, numbness or tingling   ____________________________________________   PHYSICAL EXAM:  VITAL SIGNS: ED Triage Vitals  Enc Vitals Group     BP 07/11/18 0530 (!) 144/81     Pulse Rate 07/11/18 0530 87     Resp 07/11/18 0530 19     Temp 07/11/18 0530 98.4 F (36.9 C)     Temp Source 07/11/18 0530 Oral     SpO2 07/11/18 0530 100 %     Weight 07/11/18 0532 245 lb (111.1 kg)     Height 07/11/18 0532 5\' 5"  (1.651 m)     Head Circumference --      Peak Flow --      Pain Score 07/11/18 0531 8     Pain  Loc --      Pain Edu? --      Excl. in GC? --      Constitutional: Alert and oriented. Well appearing and in no acute distress. Eyes: Conjunctivae are normal. PERRL. EOMI. Head: Atraumatic. ENT:      Ears:      Nose: Moderate congestion/rhinnorhea.      Mouth/Throat: Mucous membranes are moist.  Oropharynx erythematous.  Tonsils enlarged bilaterally.  No exudates.  Uvula midline. Neck: No stridor.  Cardiovascular: Normal rate, regular rhythm.  Good peripheral circulation. Respiratory: Normal respiratory effort without tachypnea or retractions. Lungs CTAB. Good air entry to the bases with no decreased or absent breath sounds. Gastrointestinal: Bowel sounds 4 quadrants. Soft and nontender to palpation. No guarding or rigidity. No palpable masses. No distention.  Musculoskeletal: Full range of motion to all extremities. No gross deformities appreciated. Neurologic:  Normal speech  and language. No gross focal neurologic deficits are appreciated.  Skin:  Skin is warm, dry and intact. No rash noted. Psychiatric: Mood and affect are normal. Speech and behavior are normal. Patient exhibits appropriate insight and judgement.   ____________________________________________   LABS (all labs ordered are listed, but only abnormal results are displayed)  Labs Reviewed  URINALYSIS, COMPLETE (UACMP) WITH MICROSCOPIC - Abnormal; Notable for the following components:      Result Value   Color, Urine YELLOW (*)    APPearance HAZY (*)    Hgb urine dipstick MODERATE (*)    All other components within normal limits  COMPREHENSIVE METABOLIC PANEL - Abnormal; Notable for the following components:   Glucose, Bld 102 (*)    AST 14 (*)    All other components within normal limits  GROUP A STREP BY PCR  CBC  LIPASE, BLOOD  PROTIME-INR  INFLUENZA PANEL BY PCR (TYPE A & B)  POC URINE PREG, ED  POCT PREGNANCY, URINE    ____________________________________________  EKG   ____________________________________________  RADIOLOGY Lexine Baton, personally viewed and evaluated these images (plain radiographs) as part of my medical decision making, as well as reviewing the written report by the radiologist.  Dg Chest 2 View  Result Date: 07/11/2018 CLINICAL DATA:  Hematemesis EXAM: CHEST - 2 VIEW COMPARISON:  Chest radiograph 06/09/2016 FINDINGS: Stable cardiomegaly. No consolidative pulmonary opacities. No pleural effusion or pneumothorax. Thoracic spine degenerative changes. IMPRESSION: No acute cardiopulmonary process. Electronically Signed   By: Annia Belt M.D.   On: 07/11/2018 08:34    ____________________________________________    PROCEDURES  Procedure(s) performed:    Procedures    Medications  sodium chloride 0.9 % bolus 1,000 mL (0 mLs Intravenous Stopped 07/11/18 1052)  ondansetron (ZOFRAN) injection 4 mg (4 mg Intravenous Given 07/11/18 0933)  acetaminophen (TYLENOL) tablet 1,000 mg (1,000 mg Oral Given 07/11/18 0931)     ____________________________________________   INITIAL IMPRESSION / ASSESSMENT AND PLAN / ED COURSE  Pertinent labs & imaging results that were available during my care of the patient were reviewed by me and considered in my medical decision making (see chart for details).  Review of the [place] CSRS was performed in accordance of the [facility] prior to dispensing any controlled drugs.     Patient's diagnosis is consistent with pharyngitis and sinusitis.  Lab work is largely unremarkable.  Patient initially denied any abdominal pain.  While in the emergency department and waiting for lab results, patient felt some stomach discomfort in her left upper quadrant and felt like she was going to pass out.  We discussed that patient has not had anything to eat today after vomiting this morning and would try some food.  Patient had 3 cups of orange juice, 3 packs of  graham crackers, the thing of peanut butter and apple sauce and felt significantly better.  She has not had any vomiting in the 6 hours in the emergency department.  I suspect that blood that patient saw in her vomit this morning was from her increase in nosebleeds this week and was actually from her post nasal drip.  Patient states that her nosebleeds are chronic and have been like this since she was 19.  They have been more this week during her illness.  We discussed risks and benefits of a CT scan and are going to hold off on this time.  Patient feels much better before discharge.  Patient will be discharged home with prescriptions for Augmentin. Patient is  to follow up with primary care as directed. Patient is given ED precautions to return to the ED for any worsening or new symptoms.     ____________________________________________  FINAL CLINICAL IMPRESSION(S) / ED DIAGNOSES  Final diagnoses:  Acute non-recurrent maxillary sinusitis  Frequent epistaxis  Acute pharyngitis, unspecified etiology      NEW MEDICATIONS STARTED DURING THIS VISIT:  ED Discharge Orders         Ordered    amoxicillin-clavulanate (AUGMENTIN) 875-125 MG tablet  2 times daily     07/11/18 1045    fluticasone (FLONASE) 50 MCG/ACT nasal spray  Daily     07/11/18 1045    lidocaine (XYLOCAINE) 2 % solution  As needed     07/11/18 1045    ondansetron (ZOFRAN) 4 MG tablet  Daily PRN     07/11/18 1045              This chart was dictated using voice recognition software/Dragon. Despite best efforts to proofread, errors can occur which can change the meaning. Any change was purely unintentional.    Enid Derry, PA-C 07/11/18 1529    Jeanmarie Plant, MD 07/12/18 [phone]

## 2018-07-11 NOTE — ED Triage Notes (Signed)
Pt c/o hematemesis after coughing. Pt states she woke w/ a cough that caused her to gag and vomit. Streaks of blood in vomit. Pt has no prior hx of GI bleed. Pt c/o throat pain w/ cough x 1 week. Pt denies fever.

## 2018-07-11 NOTE — ED Notes (Addendum)
See triage note  States she has been feeling weak and just not feeling well  Sore throat and has been coughing up some blood  Afebrile on arrival  States she also noticed some blood in her stools at the same time

## 2018-07-11 NOTE — ED Notes (Signed)
States she feels like she is going to pass out  And that something is going on with her stomach  Describes "burning to stomach" mainly on the left

## 2018-09-12 ENCOUNTER — Emergency Department
Admission: EM | Admit: 2018-09-12 | Discharge: 2018-09-12 | Disposition: A | Discharge status: Home / Self Care | Payer: BLUE CROSS/BLUE SHIELD | Attending: Emergency Medicine | Admitting: Emergency Medicine

## 2018-09-12 ENCOUNTER — Other Ambulatory Visit: Payer: Self-pay

## 2018-09-12 DIAGNOSIS — R6883 Chills (without fever): Secondary | ICD-10-CM | POA: Diagnosis not present

## 2018-09-12 DIAGNOSIS — J3489 Other specified disorders of nose and nasal sinuses: Secondary | ICD-10-CM | POA: Insufficient documentation

## 2018-09-12 DIAGNOSIS — Z79899 Other long term (current) drug therapy: Secondary | ICD-10-CM | POA: Insufficient documentation

## 2018-09-12 DIAGNOSIS — H66001 Acute suppurative otitis media without spontaneous rupture of ear drum, right ear: Secondary | ICD-10-CM | POA: Diagnosis not present

## 2018-09-12 DIAGNOSIS — R070 Pain in throat: Secondary | ICD-10-CM | POA: Diagnosis not present

## 2018-09-12 DIAGNOSIS — H9202 Otalgia, left ear: Secondary | ICD-10-CM | POA: Diagnosis present

## 2018-09-12 MED ORDER — AMOXICILLIN 875 MG PO TABS: 875.0000 mg | ORAL_TABLET | Freq: Two times a day (BID) | ORAL | 0 refills | Status: DC

## 2018-09-12 NOTE — ED Triage Notes (Addendum)
Pt come via POV from home with c/o left ear and sore throat pain. Pt states this has been going on for about 2 days.  Pt states pain to swallow. Pt states hx of strep. Pt states chills denies fever. Pt states lots of wax noticed from left ear.

## 2018-09-12 NOTE — ED Provider Notes (Signed)
[facility] Emergency Department Provider Note   ____________________________________________   First MD Initiated Contact with Patient 09/12/18 1422     (approximate)  I have reviewed the triage vital signs and the nursing notes.   HISTORY  Chief Complaint Otalgia and Sore   HPI Toni Robertson is a 33 y.o. female presents to the ED with complaint of left ear pain and sore throat for the last 2 days.  Patient is unaware of any fever but reports that she may have had subjective fever and reports chills.  She states that she is unsure of wax in her left ear.  Patient states she has a history of strep.  Patient states that she has increased pain with swallowing.  She rates her pain as a 5/10.      Past Medical History:  Diagnosis Date  . Family history of adverse reaction to anesthesia    MATERNAL GRANDFATHER-HARD TO WAKE UP  . Fibromyalgia   . Headache    MIGRAINE  . History of kidney stones 06/2017  . History of methicillin resistant staphylococcus aureus (MRSA) 2013    There are no active problems to display for this patient.   Past Surgical History:  Procedure Laterality Date  . IUD REMOVAL N/A 07/17/2017   Procedure: INTRAUTERINE DEVICE (IUD) REMOVAL;  Surgeon: Schermerhorn, Ihor Austin, MD;  Location: [facility] ORS;  Service: Gynecology;  Laterality: N/A;  . LAPAROSCOPIC TUBAL LIGATION Bilateral 07/17/2017   Procedure: LAPAROSCOPIC TUBAL LIGATION;  Surgeon: Feliberto Gottron, Ihor Austin, MD;  Location: [facility] ORS;  Service: Gynecology;  Laterality: Bilateral;  . NO PAST SURGERIES      Prior to Admission medications   Medication Sig Start Date End Date Taking? Authorizing Provider  amoxicillin (AMOXIL) 875 MG tablet Take 1 tablet (875 mg total) by mouth 2 (two) times daily. 09/12/18   Tommi Rumps, PA-C  buPROPion (WELLBUTRIN XL) 300 MG 24 hr tablet Take by mouth. 01/28/18   [provider]  DULoxetine (CYMBALTA) 20 MG capsule Take 20 mg by mouth  daily.    [provider]  fluticasone (FLONASE) 50 MCG/ACT nasal spray Place 2 sprays into both nostrils daily. 07/11/18 07/11/19  Enid Derry, PA-C  levonorgestrel (MIRENA) 20 MCG/24HR IUD 1 each by Intrauterine route once.    [provider]  lidocaine (XYLOCAINE) 2 % solution Use as directed 10 mLs in the mouth or throat as needed for mouth pain. 07/11/18   Enid Derry, PA-C    Allergies Coconut flavor and Azithromycin  No family history on file.  Social History Social History   Tobacco Use  . Smoking status: Never Smoker  . Smokeless tobacco: Never Used  Substance Use Topics  . Alcohol use: No  . Drug use: No    Review of Systems Constitutional: Subjective fever/chills Eyes: No visual changes. ENT: Positive sore throat. Cardiovascular: Denies chest pain. Respiratory: Denies shortness of breath. Gastrointestinal: No abdominal pain.  No nausea, no vomiting.  Positive diarrhea.  Genitourinary: Negative for dysuria. Musculoskeletal: Negative for muscle skeletal pain. Skin: Negative for rash. Neurological: Negative for headaches, focal weakness or numbness. ____________________________________________   PHYSICAL EXAM:  VITAL SIGNS: ED Triage Vitals  Enc Vitals Group     BP 09/12/18 1335 129/64     Pulse Rate 09/12/18 1335 85     Resp 09/12/18 1335 17     Temp 09/12/18 1335 98.6 F (37 C)     Temp Source 09/12/18 1335 Oral     SpO2 09/12/18 1335  100 %     Weight 09/12/18 1336 239 lb (108.4 kg)     Height 09/12/18 1336 5\' 4"  (1.626 m)     Head Circumference --      Peak Flow --      Pain Score 09/12/18 1342 5     Pain Loc --      Pain Edu? --      Excl. in GC? --    Constitutional: Alert and oriented. Well appearing and in no acute distress. Eyes: Conjunctivae are normal.  Head: Atraumatic. Nose: Mild congestion/rhinnorhea.  Right TM is injected, dull, poor landmarks.  Left TM with poor landmarks and dull.  Poor light reflex noted.   Very little cerumen is noted to both EACs. Mouth/Throat: Mucous membranes are moist.  Oropharynx non-erythematous.  Uvula is midline.  No exudate is noted. Neck: No stridor.   Hematological/Lymphatic/Immunilogical: Minimal bilateral cervical lymphadenopathy. Cardiovascular: Normal rate, regular rhythm. Grossly normal heart sounds.  Good peripheral circulation. Respiratory: Normal respiratory effort.  No retractions. Lungs CTAB. Musculoskeletal: Moves upper and lower extremities without any difficulty.  Normal gait was noted. Neurologic:  Normal speech and language. No gross focal neurologic deficits are appreciated.  Skin:  Skin is warm, dry and intact. No rash noted. Psychiatric: Mood and affect are normal. Speech and behavior are normal.  ____________________________________________   LABS (all labs ordered are listed, but only abnormal results are displayed)  Labs Reviewed - No data to display   PROCEDURES  Procedure(s) performed (including Critical Care):  Procedures   ____________________________________________   INITIAL IMPRESSION / ASSESSMENT AND PLAN / ED COURSE  As part of my medical decision making, I reviewed the following data within the electronic MEDICAL RECORD NUMBER Notes from prior ED visits and [place] Controlled Substance Database  Patient presents to the ED with complaint of left ear pain and pharyngitis for the past 2 days.  She reports a subjective fever and chills.  On exam right TM is injected with poor landmarks and light reflex.  Pharynx is slightly red but no exudate was seen and uvula is midline.  Patient was placed on amoxicillin 875 twice daily for 10 days.  She is encouraged to take Tylenol if needed for pain.  Increase fluids.  She is to follow-up with her PCP if any continued problems. ____________________________________________   FINAL CLINICAL IMPRESSION(S) / ED DIAGNOSES  Final diagnoses:  Non-recurrent acute suppurative otitis media of right ear  without spontaneous rupture of tympanic membrane  Sinus pressure     ED Discharge Orders         Ordered    amoxicillin (AMOXIL) 875 MG tablet  2 times daily     09/12/18 1452           Note:  This document was prepared using Dragon voice recognition software and may include unintentional dictation errors.    Tommi Rumps, PA-C 09/12/18 1508    Arnaldo Natal, MD 09/12/18 [phone]

## 2018-09-12 NOTE — Discharge Instructions (Signed)
Follow-up with your primary care provider if any continued problems.  Began taking amoxicillin 875 twice daily for the next 10 days.  You will need to complete the entire 10-day course even if you begin having relief of your symptoms.  You may also take Tylenol with this medication if needed for pain.  Increase fluids.

## 2018-09-14 ENCOUNTER — Encounter: Payer: Self-pay | Admitting: Emergency Medicine

## 2018-09-14 ENCOUNTER — Other Ambulatory Visit: Payer: Self-pay

## 2018-09-14 ENCOUNTER — Emergency Department
Admission: EM | Admit: 2018-09-14 | Discharge: 2018-09-14 | Disposition: A | Discharge status: Home / Self Care | Payer: BLUE CROSS/BLUE SHIELD | Attending: Emergency Medicine | Admitting: Emergency Medicine

## 2018-09-14 DIAGNOSIS — H9203 Otalgia, bilateral: Secondary | ICD-10-CM | POA: Diagnosis present

## 2018-09-14 DIAGNOSIS — H6983 Other specified disorders of Eustachian tube, bilateral: Secondary | ICD-10-CM

## 2018-09-14 DIAGNOSIS — Z79899 Other long term (current) drug therapy: Secondary | ICD-10-CM | POA: Insufficient documentation

## 2018-09-14 DIAGNOSIS — H6993 Unspecified Eustachian tube disorder, bilateral: Secondary | ICD-10-CM | POA: Diagnosis not present

## 2018-09-14 MED ORDER — TRAMADOL HCL 50 MG PO TABS: 50.0000 mg | ORAL_TABLET | Freq: Four times a day (QID) | ORAL | 0 refills | Status: DC | PRN

## 2018-09-14 MED ORDER — FEXOFENADINE-PSEUDOEPHED ER 60-120 MG PO TB12: 1.0000 | ORAL_TABLET | Freq: Two times a day (BID) | ORAL | 0 refills | Status: DC

## 2018-09-14 MED ORDER — OXYCODONE-ACETAMINOPHEN 5-325 MG PO TABS
1.0000 | ORAL_TABLET | Freq: Once | ORAL | Status: AC
  Administered 2018-09-14: 1 via ORAL
  Filled 2018-09-14: qty 1

## 2018-09-14 NOTE — Discharge Instructions (Addendum)
Continue previous medications for strep pharyngitis.  Start tramadol and Allegra-D as directed.

## 2018-09-14 NOTE — ED Notes (Signed)
See triage note  Presents with pain to both ear  Pain is worse on left than the right  Pt is tearful on arrival  Was placed on amoxil on Sunday for strept

## 2018-09-14 NOTE — ED Provider Notes (Signed)
[facility] Emergency Department Provider Note   ____________________________________________   First MD Initiated Contact with Patient 09/14/18 1057     (approximate)  I have reviewed the triage vital signs and the nursing notes.   HISTORY  Chief Complaint Otalgia    HPI Exer Gromley is a 33 y.o. female patient presents with bilateral ear pain.  Patient was seen this facility 2 days ago and diagnosed with strep pharyngitis and ear infection.  Patient started amoxicillin and states the ear pain has increased.  Patient denies hearing loss or vertigo.  Patient rates the ear pain is a 10/10.  Patient described it as "pressure/achy".  No palliative measure for complaint.          Past Medical History:  Diagnosis Date  . Family history of adverse reaction to anesthesia    MATERNAL GRANDFATHER-HARD TO WAKE UP  . Fibromyalgia   . Headache    MIGRAINE  . History of kidney stones 06/2017  . History of methicillin resistant staphylococcus aureus (MRSA) 2013    There are no active problems to display for this patient.   Past Surgical History:  Procedure Laterality Date  . IUD REMOVAL N/A 07/17/2017   Procedure: INTRAUTERINE DEVICE (IUD) REMOVAL;  Surgeon: Schermerhorn, Ihor Austin, MD;  Location: [facility] ORS;  Service: Gynecology;  Laterality: N/A;  . LAPAROSCOPIC TUBAL LIGATION Bilateral 07/17/2017   Procedure: LAPAROSCOPIC TUBAL LIGATION;  Surgeon: Feliberto Gottron, Ihor Austin, MD;  Location: [facility] ORS;  Service: Gynecology;  Laterality: Bilateral;  . NO PAST SURGERIES      Prior to Admission medications   Medication Sig Start Date End Date Taking? Authorizing Provider  amoxicillin (AMOXIL) 875 MG tablet Take 1 tablet (875 mg total) by mouth 2 (two) times daily. 09/12/18   Tommi Rumps, PA-C  buPROPion (WELLBUTRIN XL) 300 MG 24 hr tablet Take by mouth. 01/28/18   [provider]  DULoxetine (CYMBALTA) 20 MG capsule Take 20 mg by mouth daily.     [provider]  fexofenadine-pseudoephedrine (ALLEGRA-D) 60-120 MG 12 hr tablet Take 1 tablet by mouth 2 (two) times daily. 09/14/18   Joni Reining, PA-C  fluticasone (FLONASE) 50 MCG/ACT nasal spray Place 2 sprays into both nostrils daily. 07/11/18 07/11/19  Enid Derry, PA-C  levonorgestrel (MIRENA) 20 MCG/24HR IUD 1 each by Intrauterine route once.    [provider]  traMADol (ULTRAM) 50 MG tablet Take 1 tablet (50 mg total) by mouth every 6 (six) hours as needed. 09/14/18 09/14/19  Joni Reining, PA-C    Allergies Coconut flavor and Azithromycin  No family history on file.  Social History Social History   Tobacco Use  . Smoking status: Never Smoker  . Smokeless tobacco: Never Used  Substance Use Topics  . Alcohol use: No  . Drug use: No    Review of Systems Constitutional: No fever/chills Eyes: No visual changes. ENT: Sore throat.  Bilateral ear pain. Cardiovascular: Denies chest pain. Respiratory: Denies shortness of breath. Gastrointestinal: No abdominal pain.  No nausea, no vomiting.  No diarrhea.  No constipation. Genitourinary: Negative for dysuria. Musculoskeletal: Negative for back pain. Skin: Negative for rash. Neurological: Negative for headaches, focal weakness or numbness. Allergic/Immunilogical: Coconut flavor and Zithromax. ____________________________________________   PHYSICAL EXAM:  VITAL SIGNS: ED Triage Vitals  Enc Vitals Group     BP 09/14/18 1022 127/67     Pulse Rate 09/14/18 1022 (!) 101     Resp --      Temp 09/14/18  1022 98.1 F (36.7 C)     Temp Source 09/14/18 1022 Oral     SpO2 09/14/18 1022 99 %     Weight 09/14/18 1021 230 lb (104.3 kg)     Height 09/14/18 1021 5\' 4"  (1.626 m)     Head Circumference --      Peak Flow --      Pain Score --      Pain Loc --      Pain Edu? --      Excl. in GC? --     Constitutional: Alert and oriented. Well appearing and in no acute distress. Mouth/Throat: Mucous  membranes are moist.  Oropharynx non-erythematous.  Bilateral edematous and erythematous TMs. Neck: No stridor.  Hematological/Lymphatic/Immunilogical: No cervical lymphadenopathy. Cardiovascular: Normal rate, regular rhythm. Grossly normal heart sounds.  Good peripheral circulation. Respiratory: Normal respiratory effort.  No retractions. Lungs CTAB. Musculoskeletal: No lower extremity tenderness nor edema.  No joint effusions. Neurologic:  Normal speech and language. No gross focal neurologic deficits are appreciated. No gait instability. Skin:  Skin is warm, dry and intact. No rash noted. Psychiatric: Mood and affect are normal. Speech and behavior are normal.  ____________________________________________   LABS (all labs ordered are listed, but only abnormal results are displayed)  Labs Reviewed - No data to display ____________________________________________  EKG   ____________________________________________  RADIOLOGY  ED MD interpretation:    Official radiology report(s): No results found.  ____________________________________________   PROCEDURES  Procedure(s) performed (including Critical Care):  Procedures   ____________________________________________   INITIAL IMPRESSION / ASSESSMENT AND PLAN / ED COURSE  As part of my medical decision making, I reviewed the following data within the electronic MEDICAL RECORD NUMBER         Bilateral ear pain secondary otitis media station tube dysfunction.  Advised patient continue taking amoxicillin as scribe 2 days ago.  Advised to start Allegra-D and take tramadol for pain.  Advised patient to follow-up with PCP.      ____________________________________________   FINAL CLINICAL IMPRESSION(S) / ED DIAGNOSES  Final diagnoses:  Otalgia of both ears  Eustachian tube dysfunction, bilateral     ED Discharge Orders         Ordered    traMADol (ULTRAM) 50 MG tablet  Every 6 hours PRN     09/14/18 1103     fexofenadine-pseudoephedrine (ALLEGRA-D) 60-120 MG 12 hr tablet  2 times daily     09/14/18 1103           Note:  This document was prepared using Dragon voice recognition software and may include unintentional dictation errors.    Joni Reining, PA-C 09/14/18 1110    Sharman Cheek, MD 09/17/18 2146

## 2018-09-14 NOTE — ED Triage Notes (Signed)
Patient presents to ED via POV from home with c/o bilateral ear pain. Patient reports she was seen here on Sunday for same and dx with strep throat and an ear infection. Patient was prescribed amoxicillin and has been on it since Sunday but reports continued pain.

## 2019-03-05 ENCOUNTER — Other Ambulatory Visit: Payer: Self-pay

## 2019-03-05 ENCOUNTER — Emergency Department: Payer: Self-pay

## 2019-03-05 ENCOUNTER — Emergency Department
Admission: EM | Admit: 2019-03-05 | Discharge: 2019-03-05 | Disposition: A | Discharge status: Home / Self Care | Payer: Self-pay | Attending: Emergency Medicine | Admitting: Emergency Medicine

## 2019-03-05 DIAGNOSIS — Z79899 Other long term (current) drug therapy: Secondary | ICD-10-CM | POA: Insufficient documentation

## 2019-03-05 DIAGNOSIS — M5412 Radiculopathy, cervical region: Secondary | ICD-10-CM | POA: Insufficient documentation

## 2019-03-05 DIAGNOSIS — R079 Chest pain, unspecified: Secondary | ICD-10-CM | POA: Insufficient documentation

## 2019-03-05 LAB — BASIC METABOLIC PANEL
Anion gap: 8 (ref 5–15)
BUN: 16 mg/dL (ref 6–20)
CO2: 27 mmol/L (ref 22–32)
Calcium: 9.5 mg/dL (ref 8.9–10.3)
Chloride: 107 mmol/L (ref 98–111)
Creatinine, Ser: 0.94 mg/dL (ref 0.44–1.00)
GFR calc Af Amer: >60 mL/min (ref >60)
GFR calc non Af Amer: >60 mL/min (ref >60)
Glucose, Bld: 102 mg/dL — ABNORMAL HIGH (ref 70–99)
Potassium: 4.3 mmol/L (ref 3.5–5.1)
Sodium: 142 mmol/L (ref 135–145)

## 2019-03-05 LAB — CBC
HCT: 40.5 % (ref 36.0–46.0)
Hemoglobin: 12.9 g/dL (ref 12.0–15.0)
MCH: 27 pg (ref 26.0–34.0)
MCHC: 31.9 g/dL (ref 30.0–36.0)
MCV: 84.7 fL (ref 80.0–100.0)
Platelets: 399 10*3/uL (ref 150–400)
RBC: 4.78 MIL/uL (ref 3.87–5.11)
RDW: 13.9 % (ref 11.5–15.5)
WBC: 7.7 10*3/uL (ref 4.0–10.5)
nRBC: 0 % (ref 0.0–0.2)

## 2019-03-05 LAB — TROPONIN I (HIGH SENSITIVITY)
Troponin I (High Sensitivity): <2 ng/L (ref <18)
Troponin I (High Sensitivity): <2 ng/L (ref <18)

## 2019-03-05 IMAGING — DX PORTABLE CHEST - 1 VIEW
1 series · 1 of 1 positions shown · non-contrast
Comparison: Radiograph [DATE]

CLINICAL DATA: Chest pain. Left arm numbness.

EXAM:
PORTABLE CHEST 1 VIEW

[chest ap]
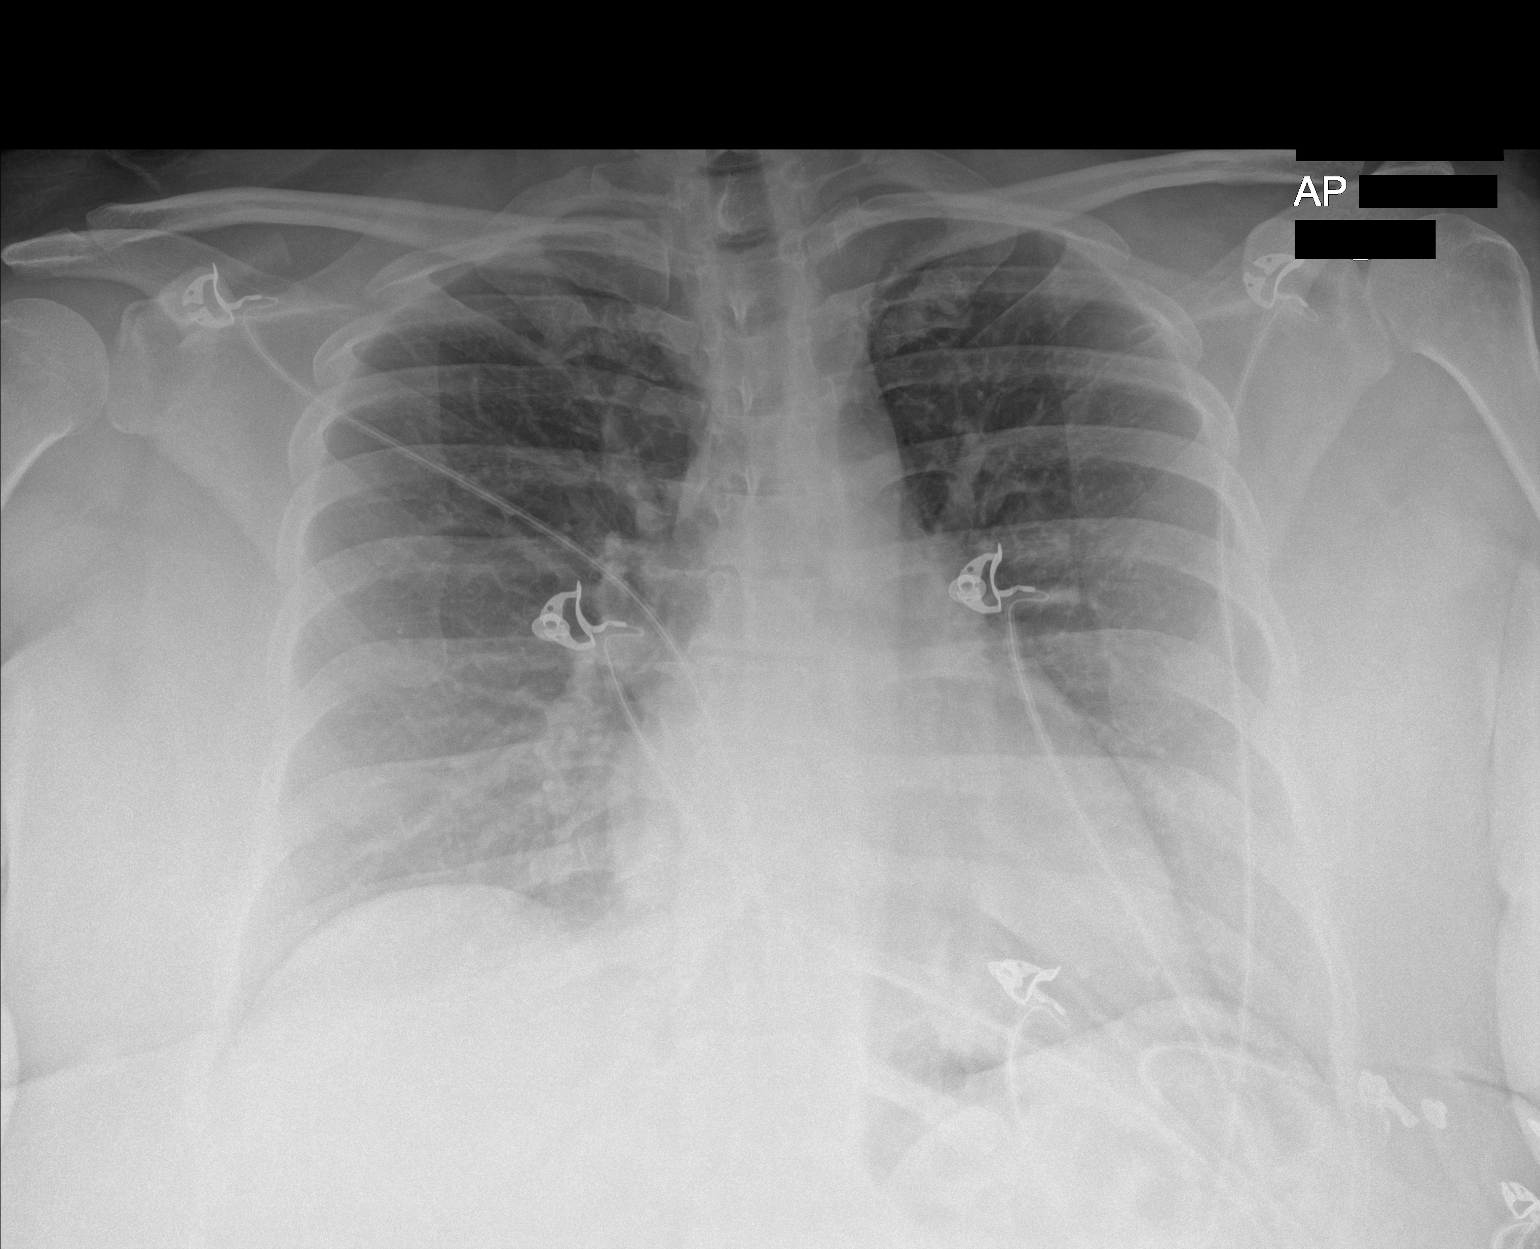

[1 of 1 positions shown; findings below may reference images not displayed]

FINDINGS: The cardiomediastinal contours are normal. Upper normal heart size
is unchanged. The lungs are clear. Pulmonary vasculature is normal.
No consolidation, pleural effusion, or pneumothorax. No acute
osseous abnormalities are seen.
IMPRESSION: No acute findings.

## 2019-03-05 IMAGING — MR MRI CERVICAL SPINE WITHOUT CONTRAST
5 series · 38 of 48 positions shown · non-contrast
Comparison: None.

CLINICAL DATA: Initial evaluation for left upper extremity
numbness.

EXAM:
MRI CERVICAL SPINE WITHOUT CONTRAST
TECHNIQUE: Multiplanar, multisequence MR imaging of the cervical spine was
performed. No intravenous contrast was administered.

[Series 5: T2 · sagittal · 3.0mm · 0.62mm/px · 6 of 15 slices shown (1 of 2)]
[im 1/15]
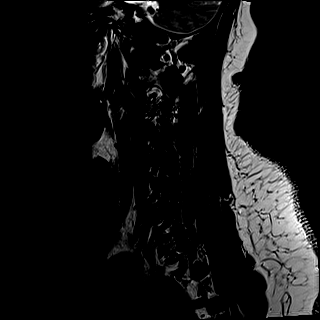
[im 3/15]
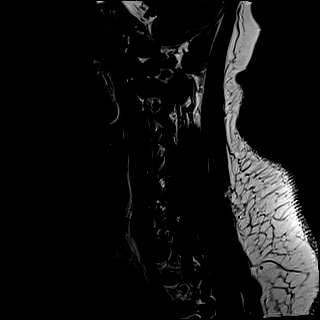
[im 6/15]
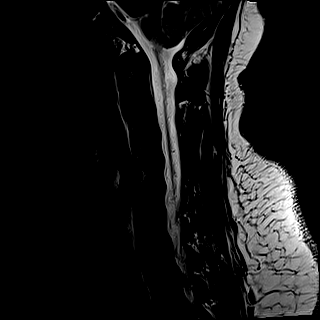
[im 9/15]
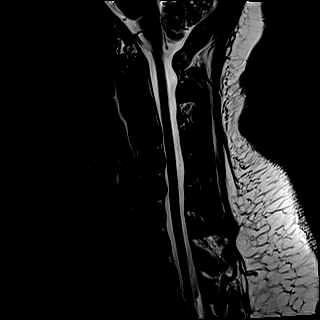
[im 12/15]
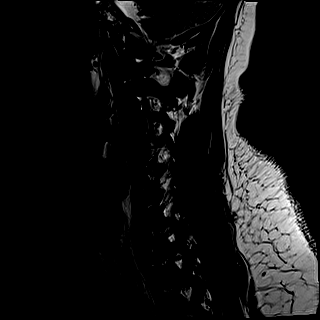
[im 15/15]
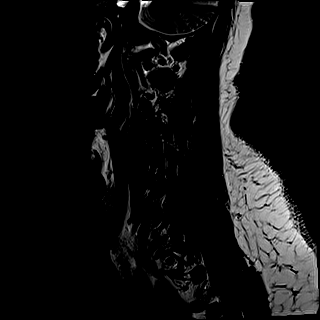

[Series 6: FLAIR · sagittal · 3.0mm · 0.78mm/px · 7 of 15 slices shown]
[im 1/15]
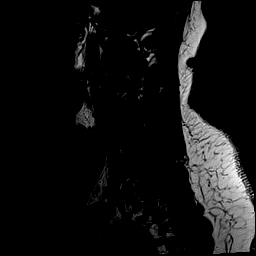
[im 3/15]
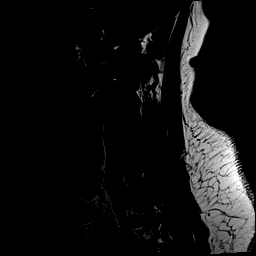
[im 5/15]
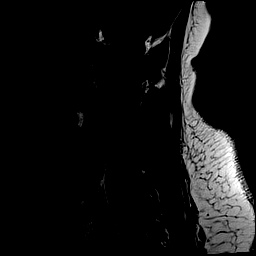
[im 8/15]
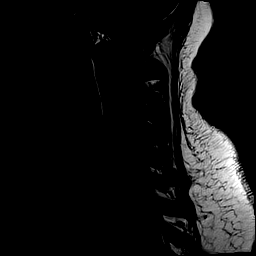
[im 10/15]
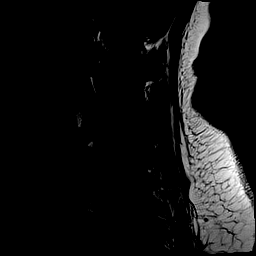
[im 12/15]
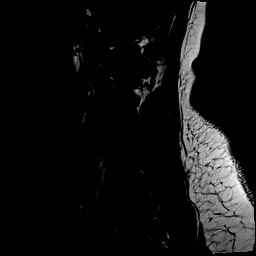
[im 15/15]
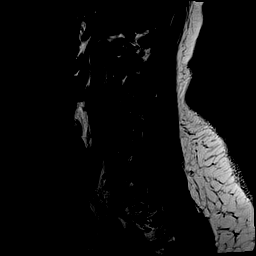

[Series 7: STIR · sagittal · 3.0mm · 0.62mm/px · 7 of 15 slices shown]
[im 1/15]
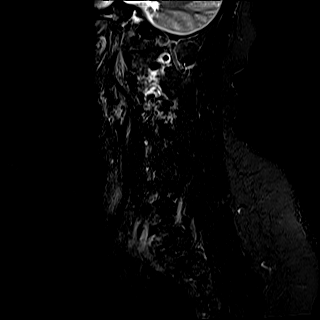
[im 3/15]
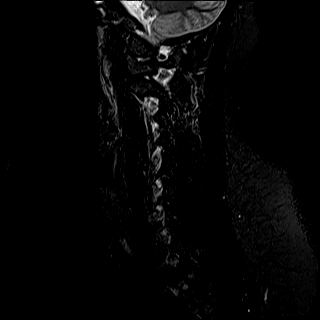
[im 5/15]
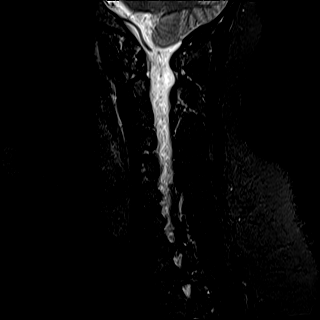
[im 8/15]
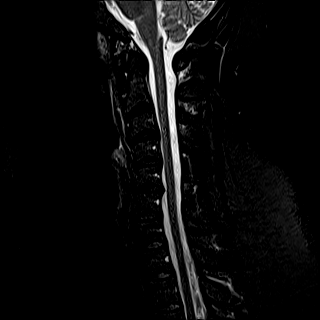
[im 10/15]
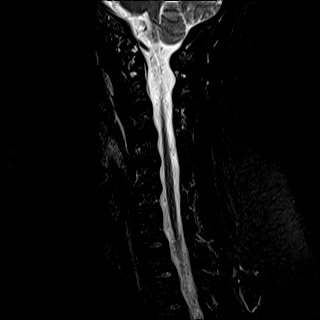
[im 12/15]
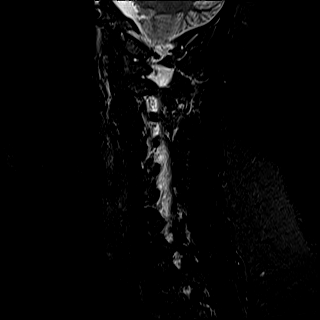
[im 15/15]
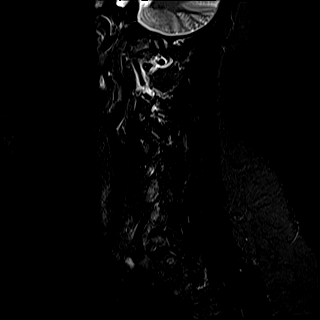

[Series 8: T2 · axial · 3.0mm · 0.70mm/px · z∈[-117,-19]mm · 10 of 29 slices shown (2 of 2)]
[im 1/29]
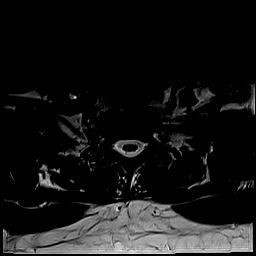
[im 3/29]
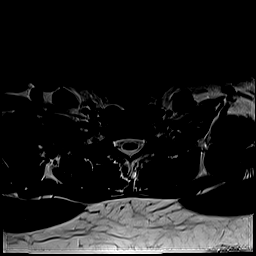
[im 5/29]
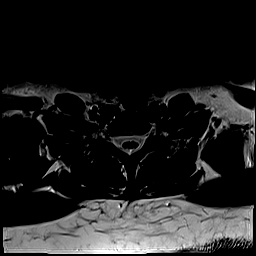
[im 7/29]
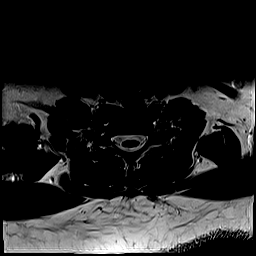
[im 9/29]
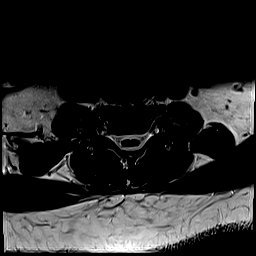
[im 13/29]
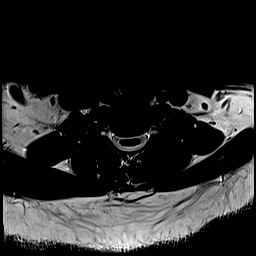
[im 16/29]
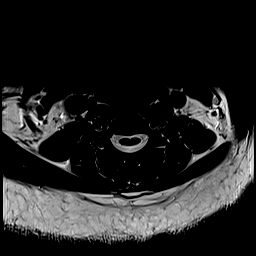
[im 20/29]
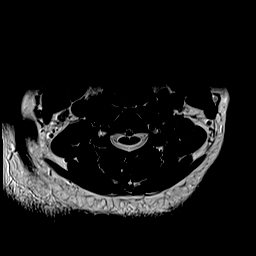
[im 24/29]
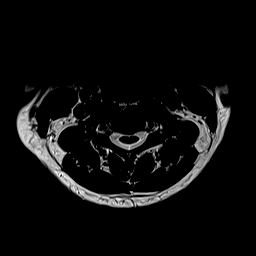
[im 29/29]
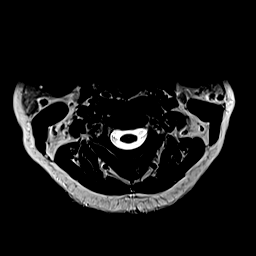

[Series 9: ax mpgr · axial · 3.0mm · 0.35mm/px · z∈[-117,-19]mm · 8 of 29 slices shown]
[im 1/29]
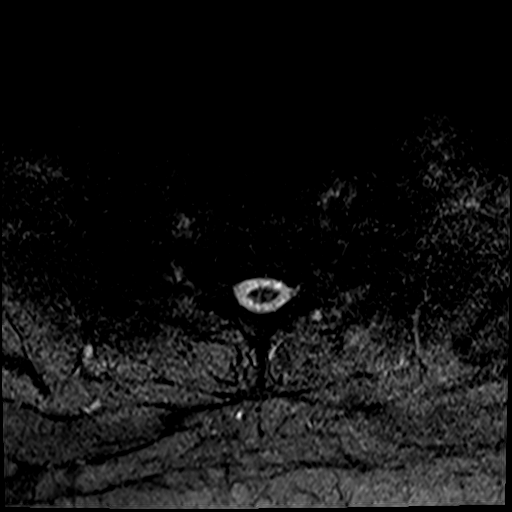
[im 5/29]
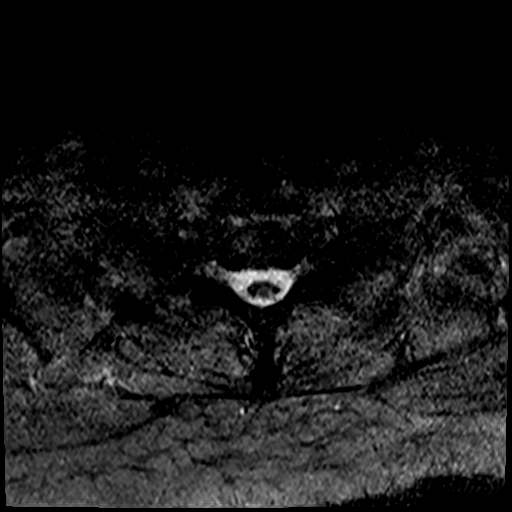
[im 9/29]
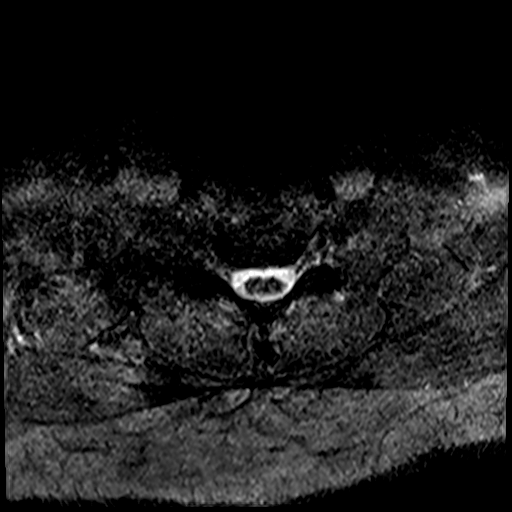
[im 13/29]
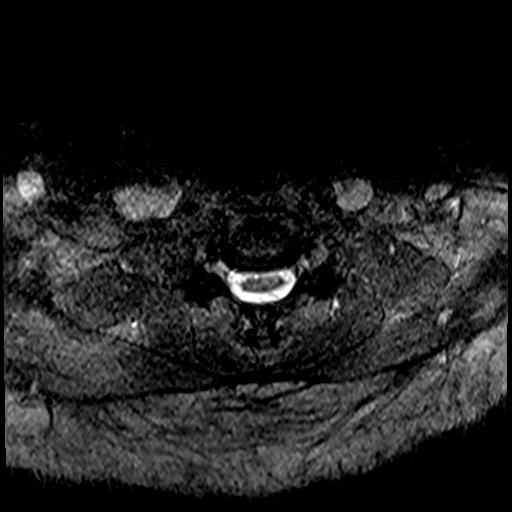
[im 16/29]
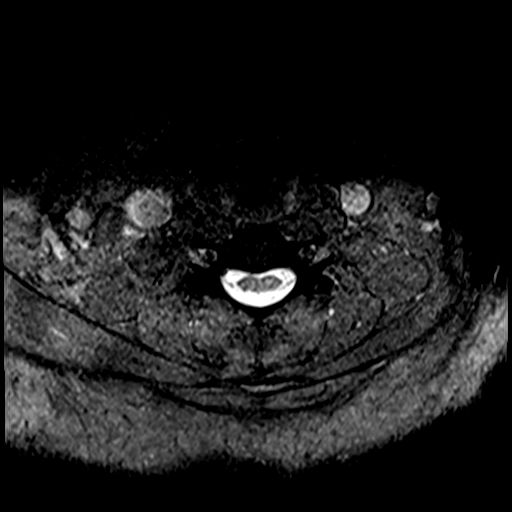
[im 20/29]
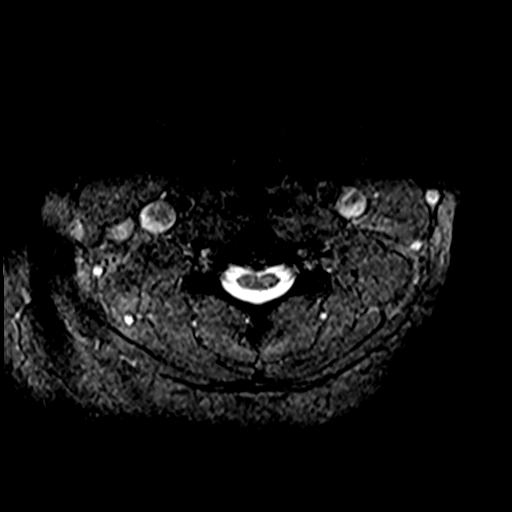
[im 24/29]
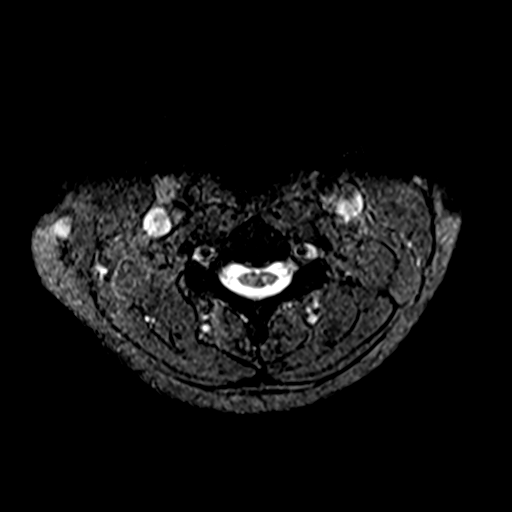
[im 29/29]
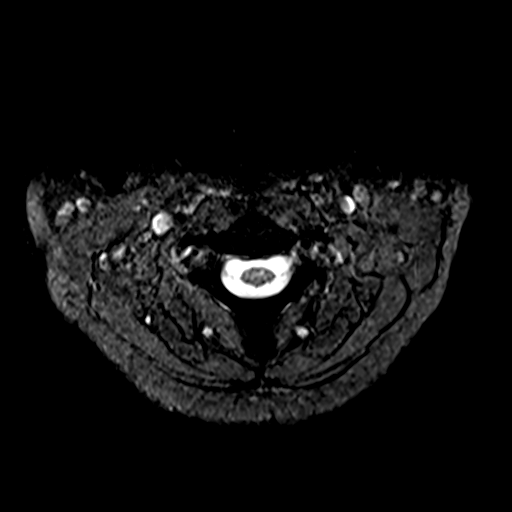

[38 of 48 positions shown; findings below may reference images not displayed]

FINDINGS: Alignment: Straightening with mild reversal of the normal cervical
lordosis. No listhesis.

Vertebrae: Vertebral body height maintained without evidence for
acute or chronic fracture. Bone marrow signal intensity diffusely
decreased on T1 weighted imaging, nonspecific, but most commonly
related to anemia, smoking, or obesity. No discrete or worrisome
osseous lesions. No abnormal marrow edema.

Cord: Signal intensity within the cervical spinal cord is normal.

Posterior Fossa, vertebral arteries, paraspinal tissues: Visualized
brain and posterior fossa within normal limits. Craniocervical
junction normal. Paraspinous and prevertebral soft tissues normal.
Normal intravascular flow voids seen within the vertebral arteries
bilaterally.

Disc levels:

C2-C3: Unremarkable.

C3-C4: Shallow posterior disc bulge mildly indents the ventral
thecal sac. No significant spinal stenosis or cord deformity.
Foramina remain widely patent.

C4-C5: Mild diffuse disc bulge. Secondary mild indentation of the
ventral thecal sac without significant spinal stenosis. Foramina
remain widely patent.

C5-C6: Mild diffuse disc bulge, slightly asymmetric to the right.
Mild effacement of the ventral thecal sac without significant spinal
stenosis. Foramina remain widely patent.

C6-C7:  Unremarkable.

C7-T1:  Unremarkable.

Visualized upper thoracic spine demonstrates no significant finding.
IMPRESSION: 1. Mild noncompressive disc bulging at C3-4 through C6-7 without
significant stenosis or neural impingement.
2. No significant foraminal encroachment within the cervical spine.
No other findings to explain patient's left upper extremity symptoms
identified.
3. Diffusely decreased T1 weighted signal within the visualized bone
marrow, nonspecific, but most commonly related to anemia, smoking,
or obesity. Clinical correlation suggested.
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
## 2019-03-14 ENCOUNTER — Emergency Department
Admission: EM | Admit: 2019-03-14 | Discharge: 2019-03-14 | Disposition: A | Discharge status: Home / Self Care | Payer: Self-pay | Attending: Emergency Medicine | Admitting: Emergency Medicine

## 2019-03-14 ENCOUNTER — Emergency Department: Payer: Self-pay

## 2019-03-14 ENCOUNTER — Other Ambulatory Visit: Payer: Self-pay

## 2019-03-14 DIAGNOSIS — Z79899 Other long term (current) drug therapy: Secondary | ICD-10-CM | POA: Insufficient documentation

## 2019-03-14 DIAGNOSIS — Y999 Unspecified external cause status: Secondary | ICD-10-CM | POA: Diagnosis not present

## 2019-03-14 DIAGNOSIS — S161XXA Strain of muscle, fascia and tendon at neck level, initial encounter: Secondary | ICD-10-CM | POA: Diagnosis not present

## 2019-03-14 DIAGNOSIS — Y9389 Activity, other specified: Secondary | ICD-10-CM | POA: Insufficient documentation

## 2019-03-14 DIAGNOSIS — S199XXA Unspecified injury of neck, initial encounter: Secondary | ICD-10-CM | POA: Diagnosis present

## 2019-03-14 DIAGNOSIS — Y92410 Unspecified street and highway as the place of occurrence of the external cause: Secondary | ICD-10-CM | POA: Insufficient documentation

## 2019-03-14 DIAGNOSIS — R51 Headache: Secondary | ICD-10-CM | POA: Insufficient documentation

## 2019-03-14 DIAGNOSIS — M5030 Other cervical disc degeneration, unspecified cervical region: Secondary | ICD-10-CM

## 2019-03-14 DIAGNOSIS — M5020 Other cervical disc displacement, unspecified cervical region: Secondary | ICD-10-CM | POA: Diagnosis not present

## 2019-03-14 DIAGNOSIS — R200 Anesthesia of skin: Secondary | ICD-10-CM
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
## 2019-03-14 MED ORDER — MELOXICAM 15 MG PO TABS: 15.0000 mg | ORAL_TABLET | Freq: Every day | ORAL | 0 refills | Status: DC

## 2019-03-14 MED ORDER — METHOCARBAMOL 500 MG PO TABS: 500.0000 mg | ORAL_TABLET | Freq: Four times a day (QID) | ORAL | 0 refills | Status: DC

## 2019-03-14 MED ORDER — PREDNISONE 50 MG PO TABS: 50.0000 mg | ORAL_TABLET | Freq: Every day | ORAL | 0 refills | Status: DC

## 2019-03-14 NOTE — ED Notes (Signed)
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
## 2019-03-14 NOTE — ED Notes (Signed)
Back from ct   Awaiting MRI
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
## 2019-06-14 ENCOUNTER — Encounter: Payer: Self-pay | Admitting: Emergency Medicine

## 2019-06-14 ENCOUNTER — Other Ambulatory Visit: Payer: Self-pay

## 2019-06-14 ENCOUNTER — Emergency Department: Payer: BC Managed Care – PPO

## 2019-06-14 ENCOUNTER — Emergency Department
Admission: EM | Admit: 2019-06-14 | Discharge: 2019-06-14 | Disposition: A | Discharge status: Home / Self Care | Payer: BC Managed Care – PPO | Attending: Emergency Medicine | Admitting: Emergency Medicine

## 2019-06-14 DIAGNOSIS — R0600 Dyspnea, unspecified: Secondary | ICD-10-CM | POA: Insufficient documentation

## 2019-06-14 DIAGNOSIS — Z79899 Other long term (current) drug therapy: Secondary | ICD-10-CM | POA: Diagnosis not present

## 2019-06-14 DIAGNOSIS — R059 Cough, unspecified: Secondary | ICD-10-CM

## 2019-06-14 DIAGNOSIS — R05 Cough: Secondary | ICD-10-CM

## 2019-06-14 DIAGNOSIS — R0602 Shortness of breath: Secondary | ICD-10-CM | POA: Diagnosis present

## 2019-06-14 IMAGING — CR DG CHEST 2V
1 series · 2 of 2 positions shown · non-contrast
Comparison: [DATE]

CLINICAL DATA: Shortness of breath

EXAM:
CHEST - 2 VIEW

[Series 1: dg chest 2 view · 0.14mm/px · 2 of 2 slices shown]
[im 1/2]
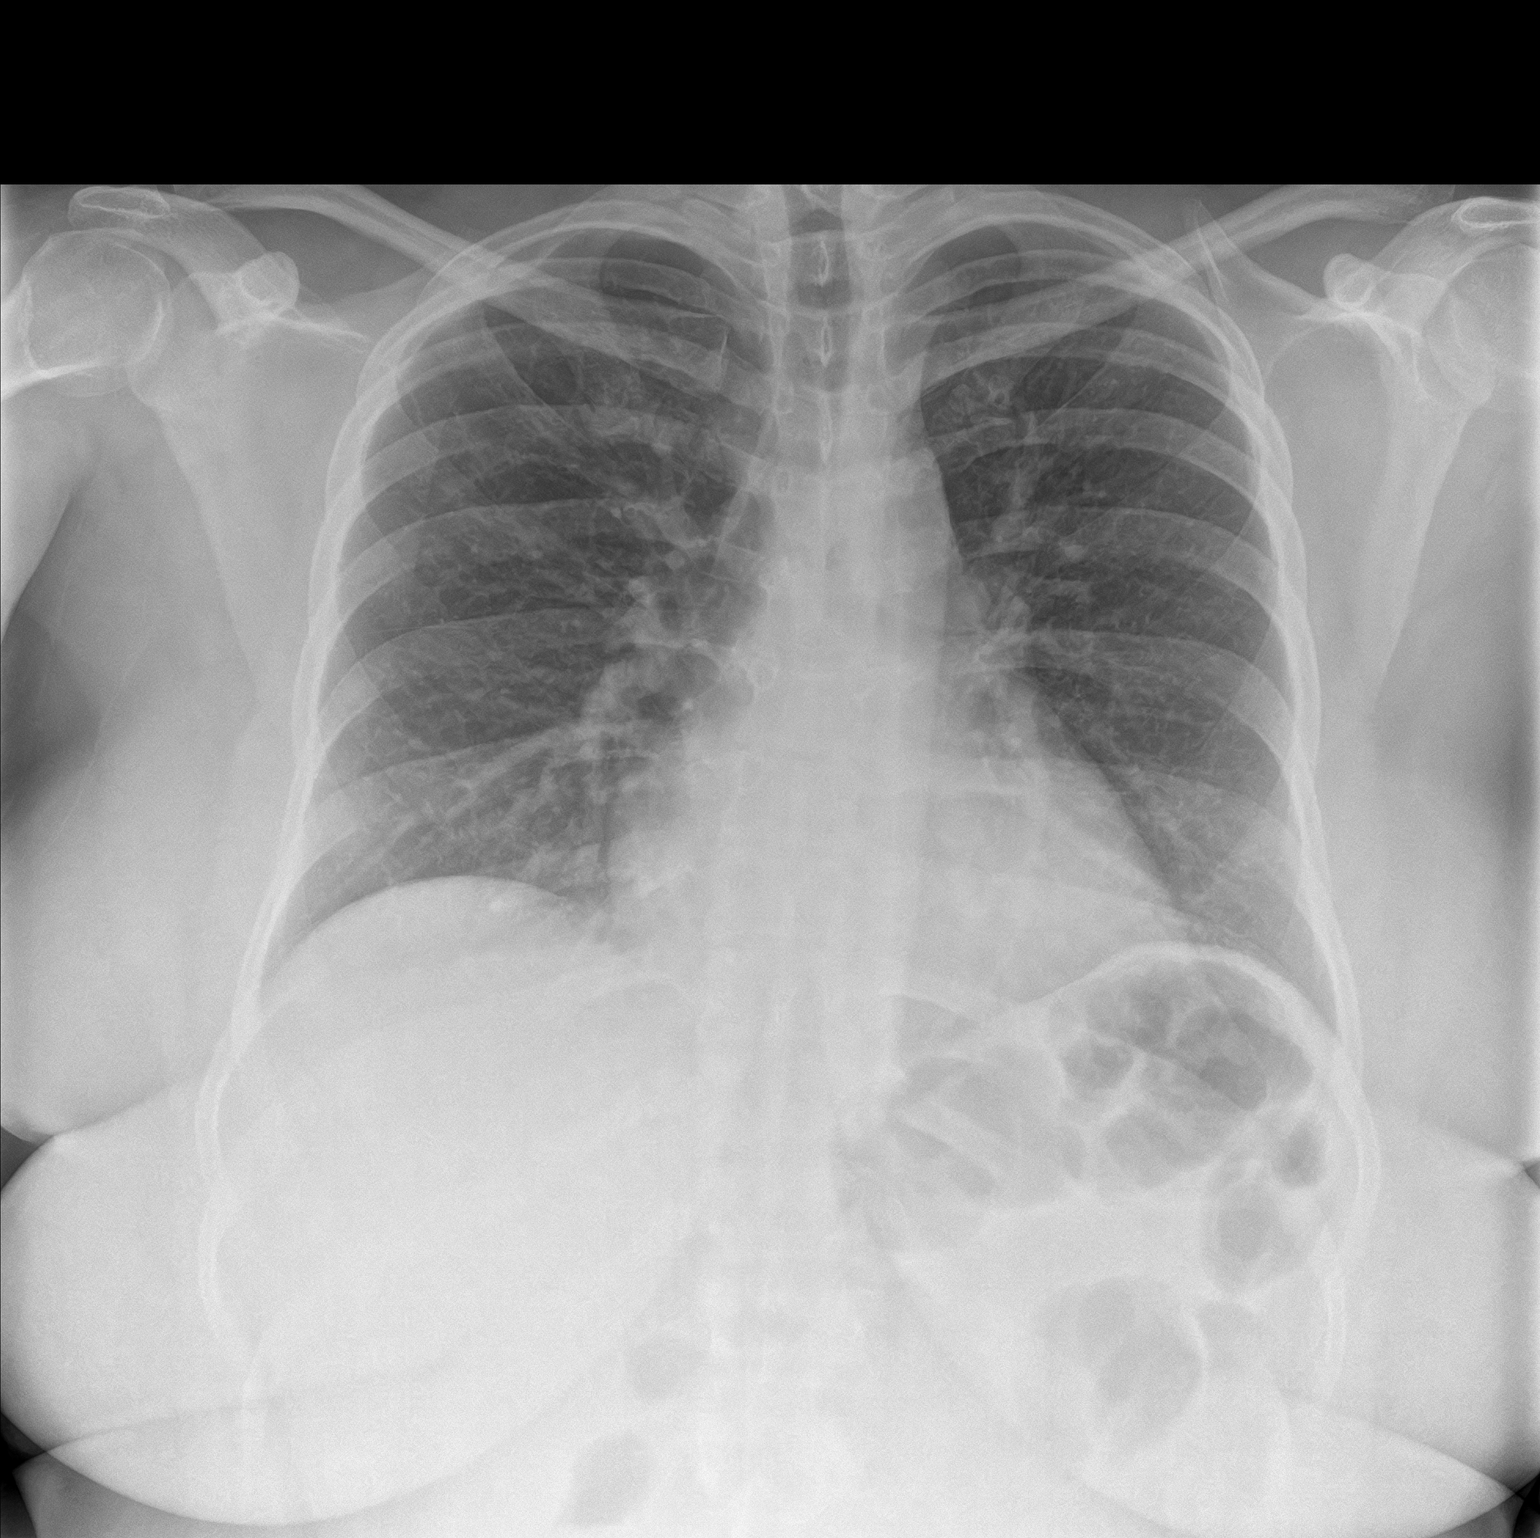
[im 2/2]
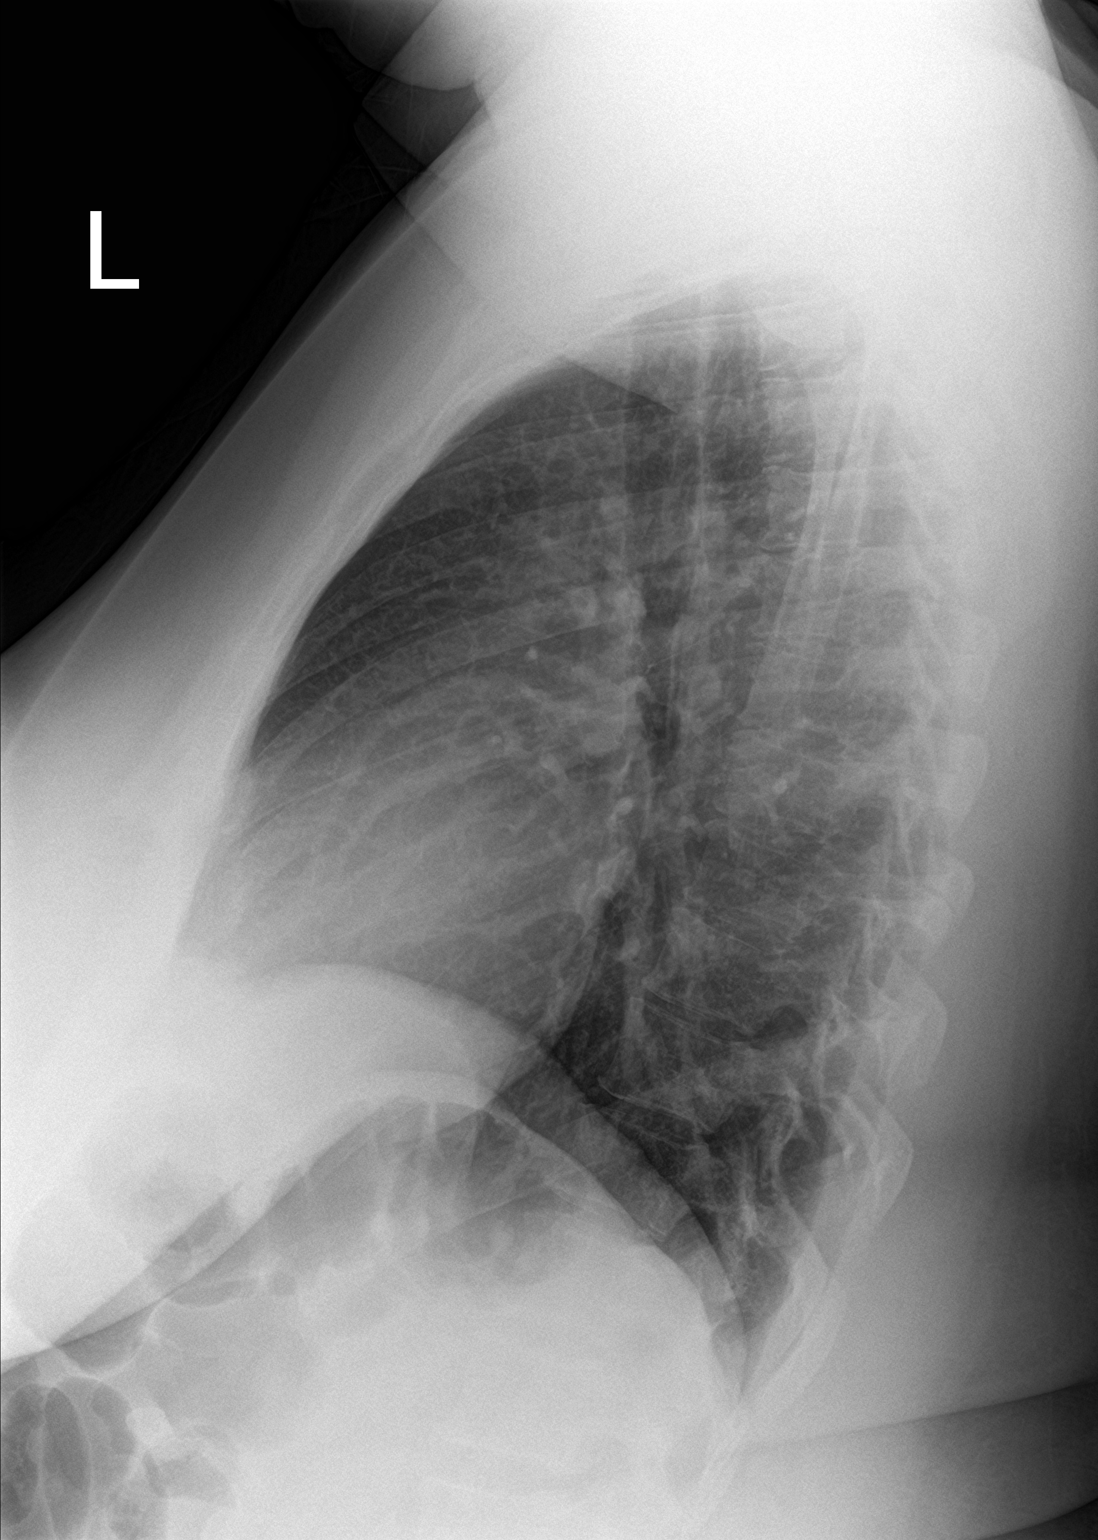

[2 of 2 positions shown; findings below may reference images not displayed]

FINDINGS: The lungs are clear. The heart size and pulmonary vascularity are
normal. No adenopathy. No bone lesions.
IMPRESSION: No edema or consolidation.

## 2019-06-14 MED ORDER — BENZONATATE 100 MG PO CAPS: 200.0000 mg | ORAL_CAPSULE | Freq: Three times a day (TID) | ORAL | 0 refills | Status: AC | PRN

## 2019-06-14 NOTE — ED Provider Notes (Signed)
-----------------------------------------   9:49 AM on 06/14/2019 -----------------------------------------  ED ECG REPORT I, Arta Silence, the attending physician, personally viewed and interpreted this ECG.  Date: 06/14/2019 EKG Time: 0943 Rate: 72 Rhythm: normal sinus rhythm QRS Axis: normal Intervals: normal ST/T Wave abnormalities: normal Narrative Interpretation: no evidence of acute ischemia    Arta Silence, MD 06/14/19 [phone]

## 2019-06-14 NOTE — ED Provider Notes (Signed)
[facility] Emergency Department Provider Note   ____________________________________________   First MD Initiated Contact with Patient 06/14/19 [phone]     (approximate)  I have reviewed the triage vital signs and the nursing notes.   HISTORY  Chief Complaint Shortness of Breath    HPI Toni Robertson is a 33 y.o. female patient complain of shortness of breath of mild expansion.  Patient was recently quarantine for COVID-19 although her test was negative.  Patient is Dr. Lennette Bihari best interest for her to self quarantine.  Patient states she returned to work yesterday and noticed dyspnea with mild exertion.  Patient states since the  quarantine she has had nocturnal dyspnea.  Patient states no history of asthma or COPD.         Past Medical History:  Diagnosis Date  . Family history of adverse reaction to anesthesia    MATERNAL GRANDFATHER-HARD TO WAKE UP  . Fibromyalgia   . Headache    MIGRAINE  . History of kidney stones 06/2017  . History of methicillin resistant staphylococcus aureus (MRSA) 2013    There are no active problems to display for this patient.   Past Surgical History:  Procedure Laterality Date  . IUD REMOVAL N/A 07/17/2017   Procedure: INTRAUTERINE DEVICE (IUD) REMOVAL;  Surgeon: Schermerhorn, Gwen Her, MD;  Location: [facility] ORS;  Service: Gynecology;  Laterality: N/A;  . LAPAROSCOPIC TUBAL LIGATION Bilateral 07/17/2017   Procedure: LAPAROSCOPIC TUBAL LIGATION;  Surgeon: Ouida Sills, Gwen Her, MD;  Location: [facility] ORS;  Service: Gynecology;  Laterality: Bilateral;  . NO PAST SURGERIES      Prior to Admission medications   Medication Sig Start Date End Date Taking? Authorizing Provider  benzonatate (TESSALON PERLES) 100 MG capsule Take 2 capsules (200 mg total) by mouth 3 (three) times daily as needed. 06/14/19 06/13/20  Sable Feil, PA-C  buPROPion (WELLBUTRIN XL) 300 MG 24 hr tablet Take by mouth. 01/28/18   [provider]   DULoxetine (CYMBALTA) 20 MG capsule Take 20 mg by mouth daily.    [provider]  fluticasone (FLONASE) 50 MCG/ACT nasal spray Place 2 sprays into both nostrils daily. 07/11/18 07/11/19  Laban Emperor, PA-C  levonorgestrel (MIRENA) 20 MCG/24HR IUD 1 each by Intrauterine route once.    [provider]    Allergies Coconut flavor and Azithromycin  No family history on file.  Social History Social History   Tobacco Use  . Smoking status: Never Smoker  . Smokeless tobacco: Never Used  Substance Use Topics  . Alcohol use: No  . Drug use: No    Review of Systems  Constitutional: No fever/chills Eyes: No visual changes. ENT: No sore throat. Cardiovascular: Denies chest pain. Respiratory: Mild dyspnea with exertion Gastrointestinal: No abdominal pain.  No nausea, no vomiting.  No diarrhea.  No constipation. Genitourinary: Negative for dysuria. Musculoskeletal: Negative for back pain. Skin: Negative for rash. Neurological: Negative for headaches, focal weakness or numbness. Allergic/Immunilogical: Coconut and Zithromax  ____________________________________________   PHYSICAL EXAM:  VITAL SIGNS: ED Triage Vitals  Enc Vitals Group     BP 06/14/19 0839 (!) 130/91     Pulse Rate 06/14/19 0839 86     Resp 06/14/19 0839 19     Temp 06/14/19 0839 98.4 F (36.9 C)     Temp Source 06/14/19 0839 Oral     SpO2 06/14/19 0839 100 %     Weight 06/14/19 0836 289 lb 0.4 oz (131.1 kg)     Height 06/14/19  4492 5\' 4"  (1.626 m)     Head Circumference --      Peak Flow --      Pain Score 06/14/19 0836 0     Pain Loc --      Pain Edu? --      Excl. in GC? --     Constitutional: Alert and oriented. Well appearing and in no acute distress.  Morbid obesity. Neck: No stridor.   Cardiovascular: Normal rate, regular rhythm. Grossly normal heart sounds.  Good peripheral circulation. Respiratory: Normal respiratory effort.  No retractions. Lungs CTAB. Gastrointestinal:  Soft and nontender. No distention. No abdominal bruits. No CVA tenderness. Genitourinary: Deferred Musculoskeletal: No lower extremity tenderness nor edema.  No joint effusions. Neurologic:  Normal speech and language. No gross focal neurologic deficits are appreciated. No gait instability. Skin:  Skin is warm, dry and intact. No rash noted. Psychiatric: Mood and affect are normal. Speech and behavior are normal.  ____________________________________________   LABS (all labs ordered are listed, but only abnormal results are displayed)  Labs Reviewed - No data to display ____________________________________________  EKG  Read by heart station Dr. With no acute findings. ____________________________________________  RADIOLOGY  ED MD interpretation:    Official radiology report(s): Dg Chest 2 View  Result Date: 06/14/2019 CLINICAL DATA:  Shortness of breath EXAM: CHEST - 2 VIEW COMPARISON:  March 05, 2019 FINDINGS: The lungs are clear. The heart size and pulmonary vascularity are normal. No adenopathy. No bone lesions. IMPRESSION: No edema or consolidation. Electronically Signed   By: March 07, 2019 III M.D.   On: 06/14/2019 08:54    ____________________________________________   PROCEDURES  Procedure(s) performed (including Critical Care):  Procedures   ____________________________________________   INITIAL IMPRESSION / ASSESSMENT AND PLAN / ED COURSE  As part of my medical decision making, I reviewed the following data within the electronic MEDICAL RECORD NUMBER     Patient presents with dyspnea secondary to mild exertion.  Patient has had a nonproductive cough for 2 weeks.  Patient was placed on COVID-19 quarantine for 14 days although she had a negative COVID-19 test.  Her doctor felt the test was an adequately performed.  Patient went back to work yesterday with increased dyspnea with mild exertion.  Discussed negative chest x-ray and EKG results with patient.  Patient  given discharge care instruction work note.  Patient advised follow-up PCP if condition persist.   Toni Robertson was evaluated in Emergency Department on 06/14/2019 for the symptoms described in the history of present illness. She was evaluated in the context of the global COVID-19 pandemic, which necessitated consideration that the patient might be at risk for infection with the SARS-CoV-2 virus that causes COVID-19. Institutional protocols and algorithms that pertain to the evaluation of patients at risk for COVID-19 are in a state of rapid change based on information released by regulatory bodies including the CDC and federal and state organizations. These policies and algorithms were followed during the patient's care in the ED.       ____________________________________________   FINAL CLINICAL IMPRESSION(S) / ED DIAGNOSES  Final diagnoses:  Dyspnea, unspecified type  Cough     ED Discharge Orders         Ordered    benzonatate (TESSALON PERLES) 100 MG capsule  3 times daily PRN     06/14/19 0958           Note:  This document was prepared using Dragon voice recognition software and may include unintentional dictation errors.  Joni ReiningSmith, Burnie Hank K, PA-C 06/14/19 1000    Emily FilbertWilliams, Jonathan E, MD 06/14/19 1329

## 2019-06-14 NOTE — ED Notes (Signed)
See triage note  Presents with some SOB  States she was recently quarantined for COVID  States was released to go back to work  States she gets SOB with ambulation   Sx's worse at night   No fever

## 2019-06-14 NOTE — ED Triage Notes (Signed)
C?O SOB.  States had COVID like symptoms and although she tested negative for COVID, PCP placed patient on 14 day quarantine.  Patient states she was just cleared to return to work on 11/30, but states today SOB seems worse.  Denies fever.  AAOx3.  Skin warm and dry. NAD.  Speaking in full sentences.

## 2019-09-13 ENCOUNTER — Emergency Department: Payer: Self-pay

## 2019-09-13 ENCOUNTER — Emergency Department
Admission: EM | Admit: 2019-09-13 | Discharge: 2019-09-13 | Disposition: A | Discharge status: Home / Self Care | Payer: Self-pay | Attending: Emergency Medicine | Admitting: Emergency Medicine

## 2019-09-13 ENCOUNTER — Other Ambulatory Visit: Payer: Self-pay

## 2019-09-13 DIAGNOSIS — R040 Epistaxis: Secondary | ICD-10-CM | POA: Insufficient documentation

## 2019-09-13 DIAGNOSIS — R519 Headache, unspecified: Secondary | ICD-10-CM | POA: Insufficient documentation

## 2019-09-13 DIAGNOSIS — Z79899 Other long term (current) drug therapy: Secondary | ICD-10-CM | POA: Insufficient documentation

## 2019-09-13 IMAGING — CT CT HEAD W/O CM
3 series · 15 of 46 positions shown, 18 images · non-contrast
Comparison: CT head [DATE]

CLINICAL DATA: Headache

EXAM:
CT HEAD WITHOUT CONTRAST
TECHNIQUE: Contiguous axial images were obtained from the base of the skull
through the vertex without intravenous contrast.

[Series 2: head wo · axial · 0.40mm/px · z∈[-112,+8]mm · 9 of 29 slices shown, 12 images]
[im 3/29  brain]
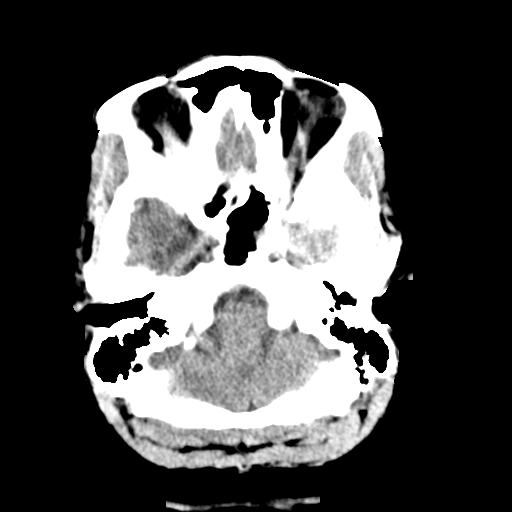
[im 3/29  bone]
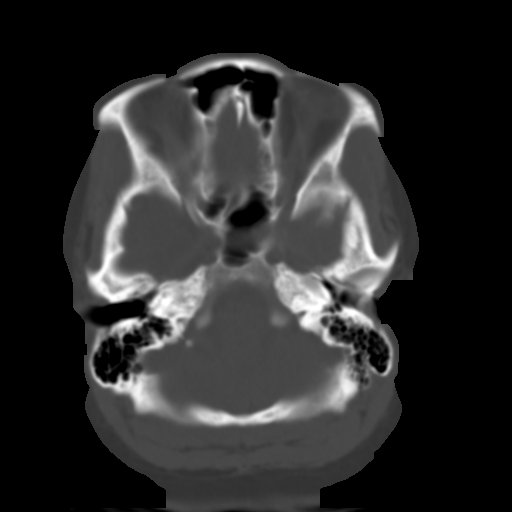
[im 6/29  brain]
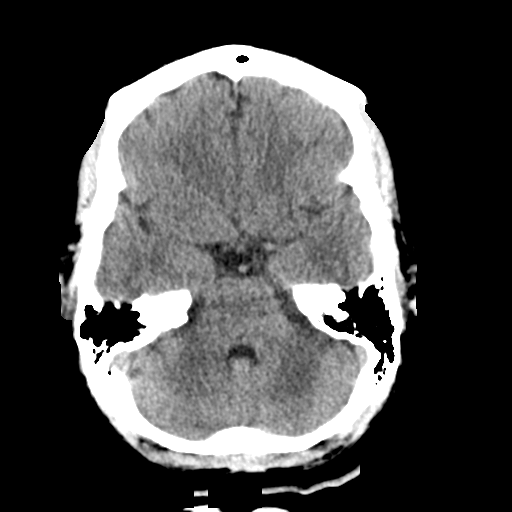
[im 9/29  brain]
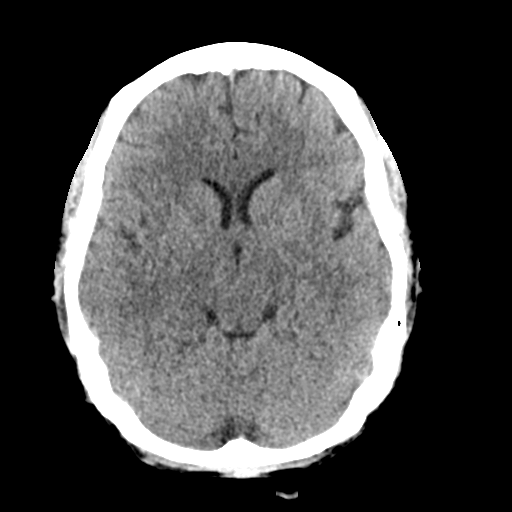
[im 12/29  brain]
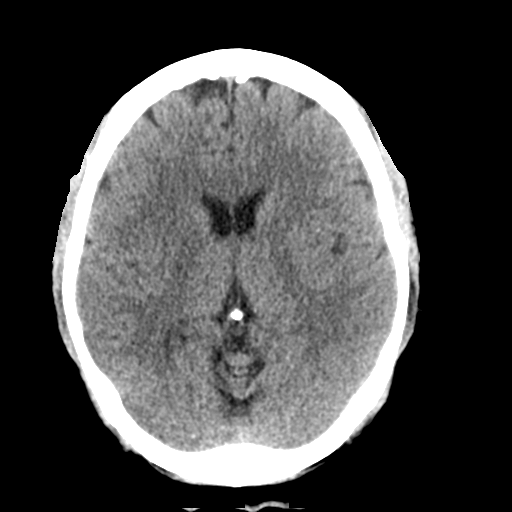
[im 15/29  brain]
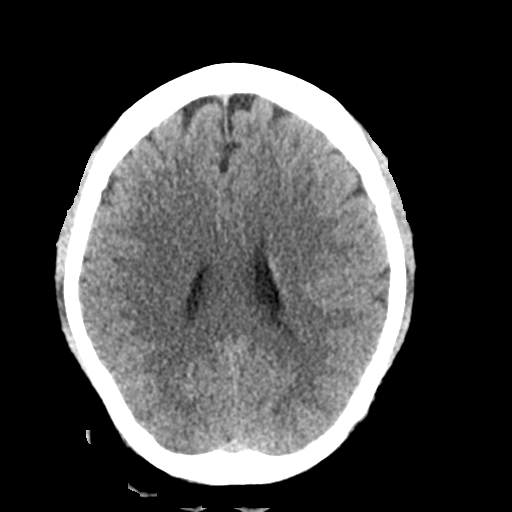
[im 15/29  bone]
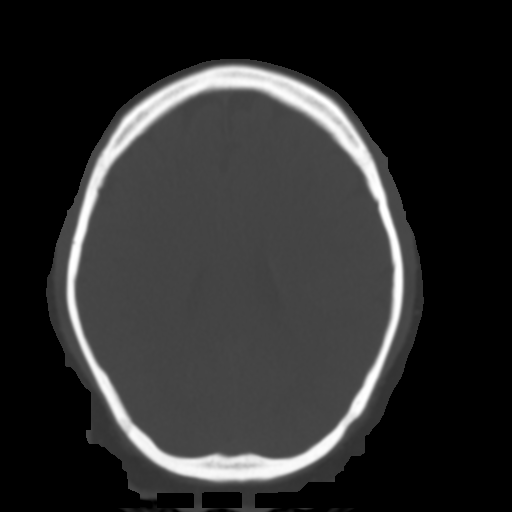
[im 18/29  brain]
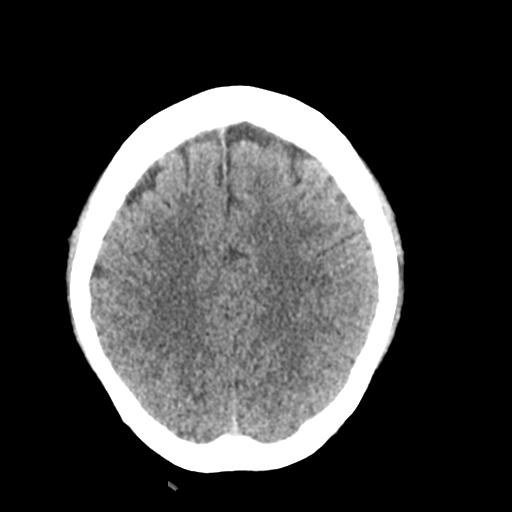
[im 21/29  brain]
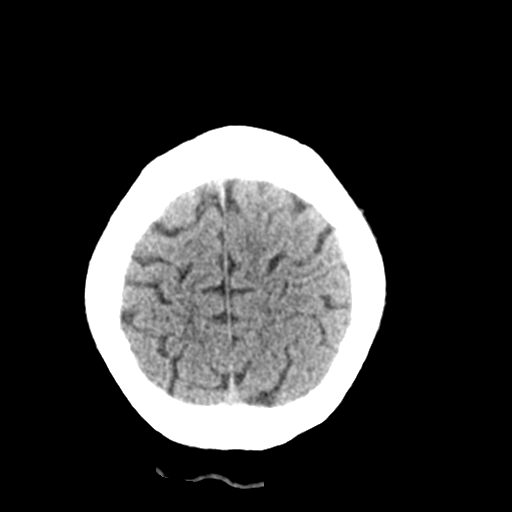
[im 24/29  brain]
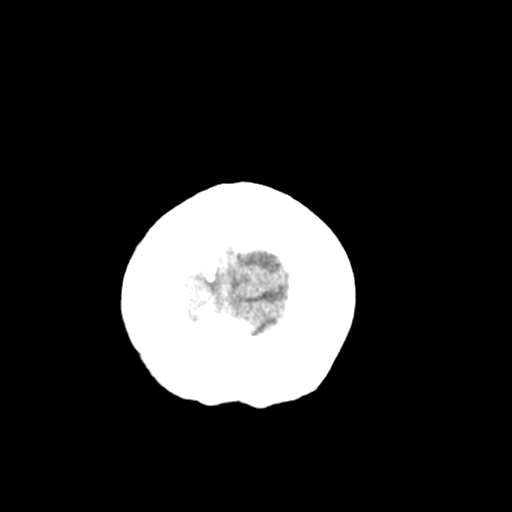
[im 27/29  brain]
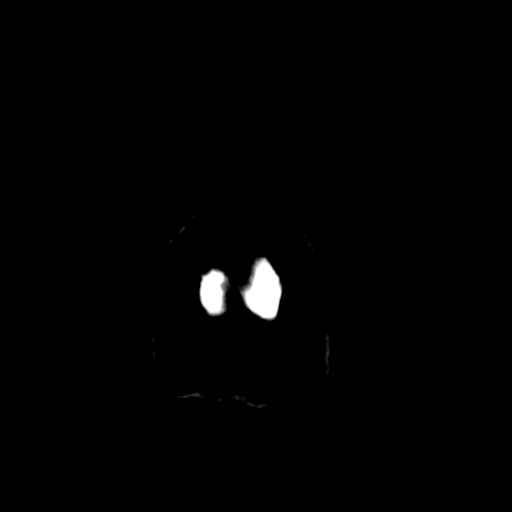
[im 27/29  bone]
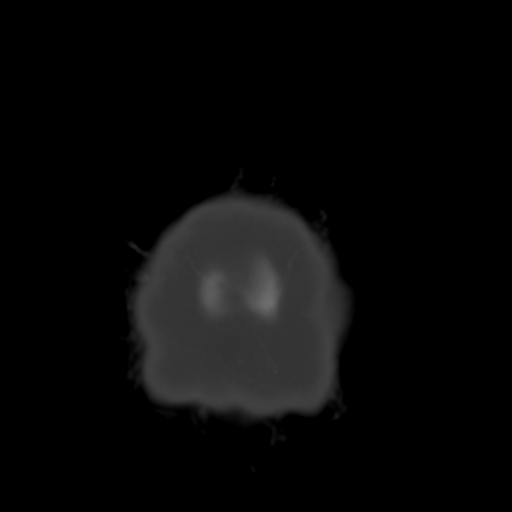

[Series 4: coronal soft tissue · coronal · 0.29mm/px · 3 of 64 slices shown]
[im 22/64  brain]
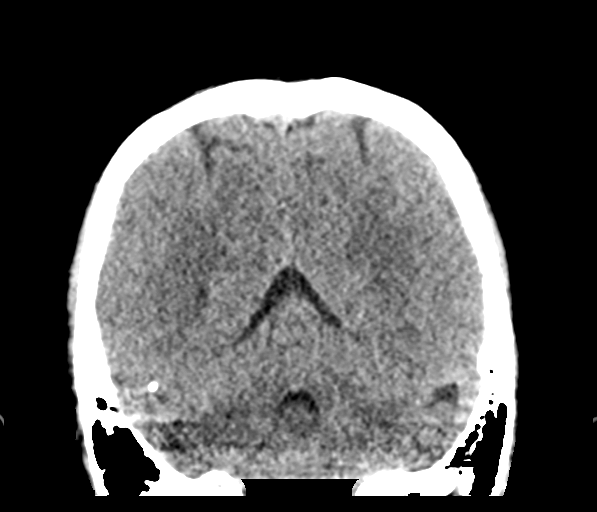
[im 29/64  brain]
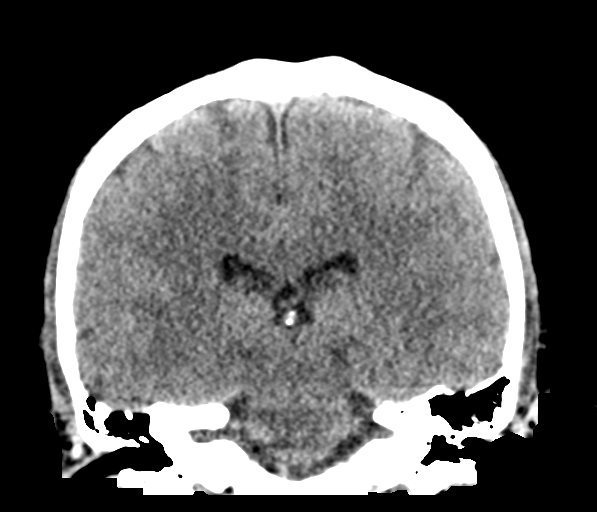
[im 36/64  brain]
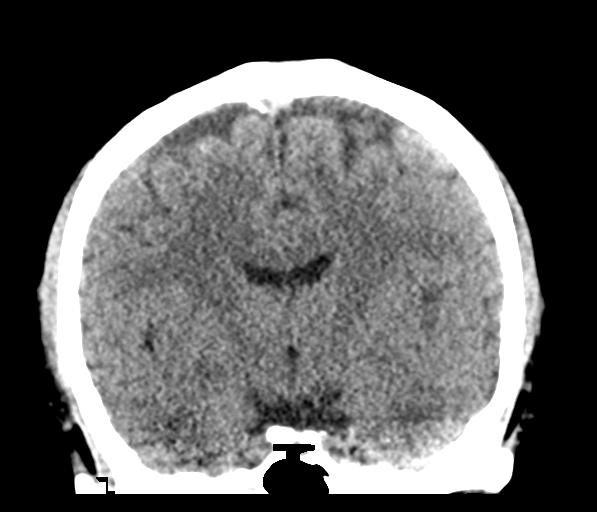

[Series 5: sagittal soft tissue · sagittal · 0.29mm/px · 3 of 53 slices shown]
[im 18/53  brain]
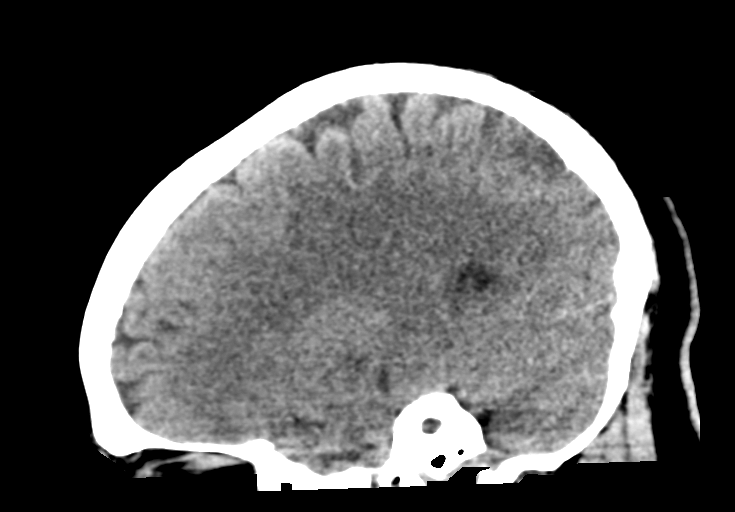
[im 27/53  brain]
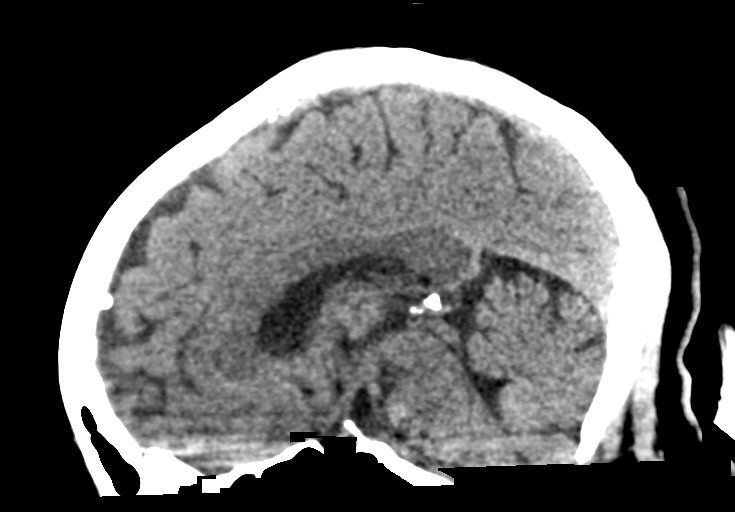
[im 35/53  brain]
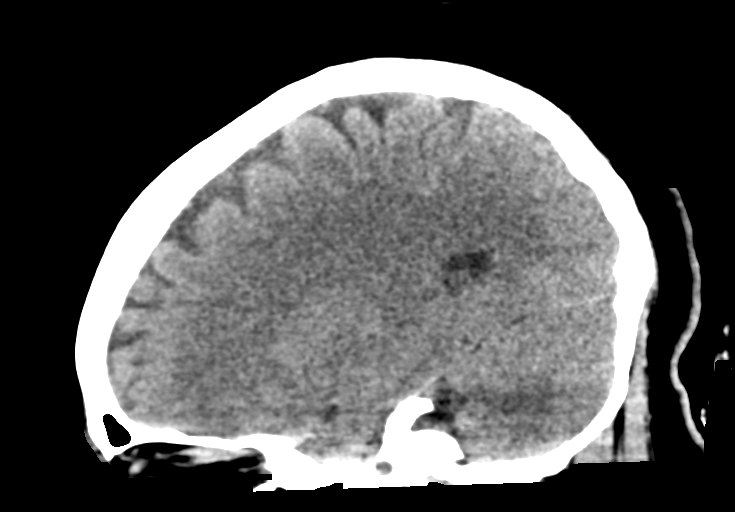

[15 of 46 positions shown; findings below may reference images not displayed]

FINDINGS: Brain: No evidence of acute infarction, hemorrhage, hydrocephalus,
extra-axial collection or mass lesion/mass effect.

Vascular: Negative for hyperdense vessel

Skull: Negative

Sinuses/Orbits: Negative

Other: None
IMPRESSION: Negative CT head

## 2019-09-13 MED ORDER — OXYMETAZOLINE HCL 0.05 % NA SOLN
1.0000 | Freq: Once | NASAL | Status: AC
  Administered 2019-09-13: 1 via NASAL
  Filled 2019-09-13: qty 30

## 2019-09-13 MED ORDER — ACETAMINOPHEN 500 MG PO TABS
1000.0000 mg | ORAL_TABLET | Freq: Once | ORAL | Status: AC
  Administered 2019-09-13: 16:00:00 1000 mg via ORAL
  Filled 2019-09-13: qty 2

## 2019-09-13 NOTE — ED Triage Notes (Signed)
Pt c/o sinus HA for the past couple of days and had bleeding from her left nare today. Pt is in NAD.

## 2019-09-13 NOTE — ED Provider Notes (Signed)
[facility] Emergency Department Provider Note  ____________________________________________  Time seen: Approximately 3:13 PM  I have reviewed the triage vital signs and the nursing notes.   HISTORY  Chief Complaint Epistaxis and Headache    HPI Toni Robertson is a 34 y.o. female that presents to the emergency department for evaluation of headache since yesterday and 3 short nosebleeds this morning.  Patient states that headache is primarily on the left side. Nosebleeds lasted about 5 minutes.  She does have some nasal congestion and an occasional cough.  She took ibuprofen around 7 AM.  Patient states that she does not usually get headaches.  She called her primary care doctor and was referred to the emergency department.  No visual changes, confusion, shortness of breath, chest pain.   Past Medical History:  Diagnosis Date  . Family history of adverse reaction to anesthesia    MATERNAL GRANDFATHER-HARD TO WAKE UP  . Fibromyalgia   . Headache    MIGRAINE  . History of kidney stones 06/2017  . History of methicillin resistant staphylococcus aureus (MRSA) 2013    There are no problems to display for this patient.   Past Surgical History:  Procedure Laterality Date  . IUD REMOVAL N/A 07/17/2017   Procedure: INTRAUTERINE DEVICE (IUD) REMOVAL;  Surgeon: Schermerhorn, Gwen Her, MD;  Location: [facility] ORS;  Service: Gynecology;  Laterality: N/A;  . LAPAROSCOPIC TUBAL LIGATION Bilateral 07/17/2017   Procedure: LAPAROSCOPIC TUBAL LIGATION;  Surgeon: Ouida Sills, Gwen Her, MD;  Location: [facility] ORS;  Service: Gynecology;  Laterality: Bilateral;  . NO PAST SURGERIES      Prior to Admission medications   Medication Sig Start Date End Date Taking? Authorizing Provider  benzonatate (TESSALON PERLES) 100 MG capsule Take 2 capsules (200 mg total) by mouth 3 (three) times daily as needed. 06/14/19 06/13/20  Sable Feil, PA-C  buPROPion (WELLBUTRIN XL) 300 MG 24 hr  tablet Take by mouth. 01/28/18   [provider]  DULoxetine (CYMBALTA) 20 MG capsule Take 20 mg by mouth daily.    [provider]  fluticasone (FLONASE) 50 MCG/ACT nasal spray Place 2 sprays into both nostrils daily. 07/11/18 07/11/19  Laban Emperor, PA-C  levonorgestrel (MIRENA) 20 MCG/24HR IUD 1 each by Intrauterine route once.    [provider]    Allergies Coconut flavor and Azithromycin  No family history on file.  Social History Social History   Tobacco Use  . Smoking status: Never Smoker  . Smokeless tobacco: Never Used  Substance Use Topics  . Alcohol use: No  . Drug use: No     Review of Systems  Constitutional: No fever/chills ENT: No upper respiratory complaints. Cardiovascular: No chest pain. Respiratory: No cough. No SOB. Gastrointestinal: No abdominal pain.  No nausea, no vomiting.  Musculoskeletal: Negative for musculoskeletal pain. Skin: Negative for rash, abrasions, lacerations, ecchymosis. Neurological: Negative for numbness or tingling. Positive for headache.   ____________________________________________   PHYSICAL EXAM:  VITAL SIGNS: ED Triage Vitals  Enc Vitals Group     BP 09/13/19 1456 (!) 142/77     Pulse Rate 09/13/19 1456 77     Resp 09/13/19 1456 16     Temp 09/13/19 1458 98.3 F (36.8 C)     Temp Source 09/13/19 1456 Oral     SpO2 09/13/19 1456 100 %     Weight 09/13/19 1456 270 lb (122.5 kg)     Height 09/13/19 1456 5\' 4"  (1.626 m)     Head Circumference --  Peak Flow --      Pain Score 09/13/19 1456 7     Pain Loc --      Pain Edu? --      Excl. in GC? --      Constitutional: Alert and oriented. Well appearing and in no acute distress. Eyes: Conjunctivae are normal. PERRL. EOMI. Head: Atraumatic. ENT:      Ears:      Nose: No congestion/rhinnorhea.  No blood in nasal passage.      Mouth/Throat: Mucous membranes are moist.  Neck: No stridor.  Cardiovascular: Normal rate, regular rhythm.   Good peripheral circulation. Respiratory: Normal respiratory effort without tachypnea or retractions. Lungs CTAB. Good air entry to the bases with no decreased or absent breath sounds. Musculoskeletal: Full range of motion to all extremities. No gross deformities appreciated. Neurologic:  Normal speech and language. No gross focal neurologic deficits are appreciated.  Skin:  Skin is warm, dry and intact. No rash noted. Psychiatric: Mood and affect are normal. Speech and behavior are normal. Patient exhibits appropriate insight and judgement.   ____________________________________________   LABS (all labs ordered are listed, but only abnormal results are displayed)  Labs Reviewed - No data to display ____________________________________________  EKG   ____________________________________________  RADIOLOGY  CT Head Wo Contrast  Result Date: 09/13/2019 CLINICAL DATA:  Headache EXAM: CT HEAD WITHOUT CONTRAST TECHNIQUE: Contiguous axial images were obtained from the base of the skull through the vertex without intravenous contrast. COMPARISON:  CT head 03/14/2019 FINDINGS: Brain: No evidence of acute infarction, hemorrhage, hydrocephalus, extra-axial collection or mass lesion/mass effect. Vascular: Negative for hyperdense vessel Skull: Negative Sinuses/Orbits: Negative Other: None IMPRESSION: Negative CT head Electronically Signed   By: Marlan Palau M.D.   On: 09/13/2019 15:43    ____________________________________________    PROCEDURES  Procedure(s) performed:    Procedures    Medications  acetaminophen (TYLENOL) tablet 1,000 mg (1,000 mg Oral Given 09/13/19 1550)  oxymetazoline (AFRIN) 0.05 % nasal spray 1 spray (1 spray Each Nare Given 09/13/19 1551)     ____________________________________________   INITIAL IMPRESSION / ASSESSMENT AND PLAN / ED COURSE  Pertinent labs & imaging results that were available during my care of the patient were reviewed by me and  considered in my medical decision making (see chart for details).  Review of the [place] CSRS was performed in accordance of the [facility] prior to dispensing any controlled drugs.   Patient presented to the emergency department for evaluation of headache and nosebleed today.  Vital signs and exam are reassuring.  CT scan negative for acute abnormalities.  Headache improved with Tylenol.  No further nosebleeds this afternoon.  Patient does not wish to stay for any further medication.  Patient is to follow up with primary care as directed. Patient is given ED precautions to return to the ED for any worsening or new symptoms.   Merlinda Wrubel was evaluated in Emergency Department on 09/13/2019 for the symptoms described in the history of present illness. She was evaluated in the context of the global COVID-19 pandemic, which necessitated consideration that the patient might be at risk for infection with the SARS-CoV-2 virus that causes COVID-19. Institutional protocols and algorithms that pertain to the evaluation of patients at risk for COVID-19 are in a state of rapid change based on information released by regulatory bodies including the CDC and federal and state organizations. These policies and algorithms were followed during the patient's care in the ED.  ____________________________________________  FINAL CLINICAL IMPRESSION(S) /  ED DIAGNOSES  Final diagnoses:  Epistaxis  Acute nonintractable headache, unspecified headache type      NEW MEDICATIONS STARTED DURING THIS VISIT:  ED Discharge Orders    None          This chart was dictated using voice recognition software/Dragon. Despite best efforts to proofread, errors can occur which can change the meaning. Any change was purely unintentional.    Enid Derry, PA-C 09/13/19 1901    Dionne Bucy, MD 09/13/19 2203

## 2019-09-13 NOTE — ED Notes (Signed)
See triage note  Presents with nose bleed  States she had 3 nosebleeds while at work today  States bleeding was from left nare  No bleeding at present but is having slight h/a

## 2019-10-20 DIAGNOSIS — R519 Headache, unspecified: Secondary | ICD-10-CM | POA: Insufficient documentation

## 2020-02-27 ENCOUNTER — Other Ambulatory Visit: Payer: Self-pay

## 2020-02-27 ENCOUNTER — Ambulatory Visit
Admission: RE | Admit: 2020-02-27 | Discharge: 2020-02-27 | Disposition: A | Discharge status: Home / Self Care | Payer: 59 | Source: Ambulatory Visit

## 2020-02-27 VITALS — BP 127/85 | HR 97 | Temp 98.8°F | Resp 18 | Ht 64.0 in | Wt 265.0 lb

## 2020-02-27 DIAGNOSIS — S339XXA Sprain of unspecified parts of lumbar spine and pelvis, initial encounter: Secondary | ICD-10-CM

## 2020-02-27 DIAGNOSIS — R252 Cramp and spasm: Secondary | ICD-10-CM

## 2020-02-27 MED ORDER — CYCLOBENZAPRINE HCL 10 MG PO TABS: 10.0000 mg | ORAL_TABLET | Freq: Three times a day (TID) | ORAL | 0 refills | Status: AC | PRN

## 2020-02-27 MED ORDER — DICLOFENAC SODIUM 75 MG PO TBEC: 75.0000 mg | DELAYED_RELEASE_TABLET | Freq: Two times a day (BID) | ORAL | 1 refills | Status: AC

## 2020-02-27 NOTE — ED Triage Notes (Signed)
Patient in today c/o back pain x 3-4 days. Patient states she has been doing a lot of lifting at her job recently. Patient denies urinary symptoms. Patient has taken OTC Ibuprofen without relief.

## 2020-02-27 NOTE — ED Provider Notes (Signed)
[facility]    CSN: [id number] Arrival date & time: 02/27/20  1245      History   Chief Complaint Chief Complaint  Patient presents with  . Appointment  . Back Pain    HPI Toni Robertson is a 34 y.o. female.   34 y/o female presents for bilateral lower back pain x 4 days. She says she has been doing a lot more lifting at her job; denies work related injury--and says that seems to have increased the pain. She says pain is 8/10 currently. Denies radiation of pain to extremities. Twisting to the left increases her pain, admits constant aching pain with occasional sharp pain. She denies numbness/tingling/weakness. Denies loss of bowel/bladder control, saddle anesthesia. Has taken Motrin w/o relief. Has also tried stretching w/o relief. Denies similar problem in the past. No prior back surgeries. Denies dysuria, abdominal pain, etc. No other concerns today.     Past Medical History:  Diagnosis Date  . Family history of adverse reaction to anesthesia    MATERNAL GRANDFATHER-HARD TO WAKE UP  . Fibromyalgia   . Headache    MIGRAINE  . History of kidney stones 06/2017  . History of methicillin resistant staphylococcus aureus (MRSA) 2013    There are no problems to display for this patient.   Past Surgical History:  Procedure Laterality Date  . IUD REMOVAL N/A 07/17/2017   Procedure: INTRAUTERINE DEVICE (IUD) REMOVAL;  Surgeon: Schermerhorn, Ihor Austin, MD;  Location: [facility] ORS;  Service: Gynecology;  Laterality: N/A;  . LAPAROSCOPIC TUBAL LIGATION Bilateral 07/17/2017   Procedure: LAPAROSCOPIC TUBAL LIGATION;  Surgeon: Feliberto Gottron, Ihor Austin, MD;  Location: [facility] OR[facility];  Service: Gynecology;  Laterality: Bilateral;    OB History   No obstetric history on file.      Home Medications    Prior to Admission medications   Medication Sig Start Date End Date Taking? Authorizing Provider  benzonatate (TESSALON PERLES) 100 MG capsule Take 2 capsules (200 mg total) by mouth 3  (three) times daily as needed. 06/14/19 06/13/20  Joni Reining, PA-C  buPROPion (WELLBUTRIN XL) 300 MG 24 hr tablet Take by mouth. 01/28/18   [provider]  cyclobenzaprine (FLEXERIL) 10 MG tablet Take 1 tablet (10 mg total) by mouth 3 (three) times daily as needed for up to 7 days for muscle spasms. 02/27/20 03/05/20  Shirlee Latch, PA-C  diclofenac (VOLTAREN) 75 MG EC tablet Take 1 tablet (75 mg total) by mouth 2 (two) times daily for 10 days. 02/27/20 03/08/20  Eusebio Friendly B, PA-C  DULoxetine (CYMBALTA) 20 MG capsule Take 20 mg by mouth daily.    [provider]  fluticasone (FLONASE) 50 MCG/ACT nasal spray Place 2 sprays into both nostrils daily. 07/11/18 07/11/19  Enid Derry, PA-C  levonorgestrel (MIRENA) 20 MCG/24HR IUD 1 each by Intrauterine route once.    [provider]    Family History Family History  Problem Relation Age of Onset  . Hypertension Mother   . Diabetes Father     Social History Social History   Tobacco Use  . Smoking status: Never Smoker  . Smokeless tobacco: Never Used  Vaping Use  . Vaping Use: Never used  Substance Use Topics  . Alcohol use: Yes    Comment: social  . Drug use: No     Allergies   Coconut flavor and Azithromycin   Review of Systems Review of Systems  Constitutional: Negative for fatigue and fever.  Respiratory: Negative for shortness of breath.  Cardiovascular: Negative for chest pain.  Gastrointestinal: Negative for abdominal pain, nausea and vomiting.  Genitourinary: Negative for difficulty urinating, dysuria and hematuria.  Musculoskeletal: Positive for back pain. Negative for arthralgias, gait problem and joint swelling.  Skin: Negative for rash and wound.  Neurological: Negative for dizziness, weakness and headaches.     Physical Exam Triage Vital Signs ED Triage Vitals  Enc Vitals Group     BP 02/27/20 1303 127/85     Pulse Rate 02/27/20 1303 97     Resp 02/27/20 1303 18     Temp  02/27/20 1303 98.8 F (37.1 C)     Temp Source 02/27/20 1303 Oral     SpO2 02/27/20 1303 99 %     Weight 02/27/20 1303 265 lb (120.2 kg)     Height 02/27/20 1303 5\' 4"  (1.626 m)     Head Circumference --      Peak Flow --      Pain Score 02/27/20 1302 8     Pain Loc --      Pain Edu? --      Excl. in GC? --    No data found.  Updated Vital Signs BP 127/85 (BP Location: Left Arm)   Pulse 97   Temp 98.8 F (37.1 C) (Oral)   Resp 18   Ht 5\' 4"  (1.626 m)   Wt 265 lb (120.2 kg)   LMP 01/30/2020 (Approximate) Comment: tubal ligation  SpO2 99%   BMI 45.49 kg/m       Physical Exam Vitals and nursing note reviewed.  Constitutional:      General: She is not in acute distress.    Appearance: Normal appearance. She is well-developed. She is obese. She is not ill-appearing or toxic-appearing.  HENT:     Head: Normocephalic and atraumatic.  Eyes:     General: No scleral icterus.       Right eye: No discharge.        Left eye: No discharge.  Cardiovascular:     Rate and Rhythm: Normal rate and regular rhythm.  Pulmonary:     Effort: Pulmonary effort is normal. No respiratory distress.     Breath sounds: Normal breath sounds.  Abdominal:     Palpations: Abdomen is soft.     Tenderness: There is no right CVA tenderness or left CVA tenderness.  Musculoskeletal:     Lumbar back: Tenderness (diffuse moderate TTP bilateral paralumbar muscles) present. No bony tenderness. Normal range of motion. Negative right straight leg raise test and negative left straight leg raise test.     Comments: 5/5 strength bilat LEs  Skin:    General: Skin is warm and dry.  Neurological:     General: No focal deficit present.     Mental Status: She is alert and oriented to person, place, and time. Mental status is at baseline.     Motor: No weakness.     Gait: Gait normal.  Psychiatric:        Mood and Affect: Mood normal.        Behavior: Behavior normal.        Thought Content: Thought content  normal.      UC Treatments / Results  Labs (all labs ordered are listed, but only abnormal results are displayed) Labs Reviewed - No data to display  EKG   Radiology No results found.  Procedures Procedures (including critical care time)  Medications Ordered in UC Medications - No data to display  Initial  Impression / Assessment and Plan / UC Course  I have reviewed the triage vital signs and the nursing notes.  Pertinent labs & imaging results that were available during my care of the patient were reviewed by me and considered in my medical decision making (see chart for details).   Exam is consistent with lumbar strain. Advised RICE, heat, NSAIDs and muscle relaxers. F/ u with urgent care or PCP if not feeling better in a week. ED precautions discussed   Final Clinical Impressions(s) / UC Diagnoses   Final diagnoses:  Sprain of ligament of lumbosacral joint, initial encounter  Muscle cramp     Discharge Instructions     BACK PAIN: Stressed avoiding painful activities . PRICE guidelines reviewed. May alternate ice and heat. Consider use of muscle rubs, Salonpas patches, etc. Use medications as directed including muscle relaxers. Take anti-inflammatory medications as prescribed. F/u with PCP in 7-10 days for reexamination, and please feel free to call or return to the urgent care at any time for any questions or concerns you may have and we will be happy to help you!   BACK PAIN RED FLAGS: If the back pain acutely worsens or there are any red flag symptoms such as numbness/tingling, leg weakness, saddle anesthesia, or loss of bowel/bladder control, go immediately to the ER. Follow up with [place] as scheduled or sooner if the pain does not begin to resolve or if it worsens before the follow up       ED Prescriptions    Medication Sig Dispense Auth. Provider   cyclobenzaprine (FLEXERIL) 10 MG tablet Take 1 tablet (10 mg total) by mouth 3 (three) times daily as needed for up  to 7 days for muscle spasms. 20 tablet Eusebio Friendly B, PA-C   diclofenac (VOLTAREN) 75 MG EC tablet Take 1 tablet (75 mg total) by mouth 2 (two) times daily for 10 days. 20 tablet Gareth Morgan     PDMP not reviewed this encounter.   Shirlee Latch, PA-C 02/27/20 1514

## 2020-02-27 NOTE — Discharge Instructions (Signed)
BACK PAIN: Stressed avoiding painful activities . PRICE guidelines reviewed. May alternate ice and heat. Consider use of muscle rubs, Salonpas patches, etc. Use medications as directed including muscle relaxers. Take anti-inflammatory medications as prescribed. F/u with PCP in 7-10 days for reexamination, and please feel free to call or return to the urgent care at any time for any questions or concerns you may have and we will be happy to help you!  ° °BACK PAIN RED FLAGS: If the back pain acutely worsens or there are any red flag symptoms such as numbness/tingling, leg weakness, saddle anesthesia, or loss of bowel/bladder control, go immediately to the ER. Follow up with us as scheduled or sooner if the pain does not begin to resolve or if it worsens before the follow up  °

## 2020-03-01 ENCOUNTER — Encounter: Payer: Self-pay | Admitting: Emergency Medicine

## 2020-03-01 ENCOUNTER — Other Ambulatory Visit: Payer: Self-pay

## 2020-03-01 DIAGNOSIS — Z79899 Other long term (current) drug therapy: Secondary | ICD-10-CM | POA: Diagnosis not present

## 2020-03-01 DIAGNOSIS — M545 Low back pain: Secondary | ICD-10-CM | POA: Diagnosis present

## 2020-03-01 DIAGNOSIS — M5441 Lumbago with sciatica, right side: Secondary | ICD-10-CM | POA: Diagnosis not present

## 2020-03-01 NOTE — ED Triage Notes (Signed)
Patient ambulatory to triage with steady gait, without difficulty or distress noted; pt reports seen at [facility] on Monday and rx meds for back spasms/sprain; st she was told to return for persistent pain; pt denies any accomp symptoms

## 2020-03-02 ENCOUNTER — Emergency Department
Admission: EM | Admit: 2020-03-02 | Discharge: 2020-03-02 | Disposition: A | Discharge status: Home / Self Care | Payer: 59 | Attending: Emergency Medicine | Admitting: Emergency Medicine

## 2020-03-02 DIAGNOSIS — M5441 Lumbago with sciatica, right side: Secondary | ICD-10-CM

## 2020-03-02 MED ORDER — DIAZEPAM 2 MG PO TABS: 2.0000 mg | ORAL_TABLET | Freq: Three times a day (TID) | ORAL | 0 refills | Status: DC | PRN

## 2020-03-02 MED ORDER — OXYCODONE-ACETAMINOPHEN 5-325 MG PO TABS: 1.0000 | ORAL_TABLET | ORAL | 0 refills | Status: DC | PRN

## 2020-03-02 MED ORDER — PREDNISONE 20 MG PO TABS
30.0000 mg | ORAL_TABLET | Freq: Once | ORAL | Status: AC
  Administered 2020-03-02: 30 mg via ORAL
  Filled 2020-03-02: qty 1

## 2020-03-02 MED ORDER — KETOROLAC TROMETHAMINE 60 MG/2ML IM SOLN
30.0000 mg | Freq: Once | INTRAMUSCULAR | Status: AC
  Administered 2020-03-02: 30 mg via INTRAMUSCULAR
  Filled 2020-03-02: qty 2

## 2020-03-02 MED ORDER — METHYLPREDNISOLONE 4 MG PO TBPK: ORAL_TABLET | ORAL | 0 refills | Status: DC

## 2020-03-02 NOTE — Discharge Instructions (Signed)
1.  You may take medicines as needed for pain and muscle spasms (Percocet/Valium #15).  Hold Flexeril use while you are taking the Valium. 2.  Take steroid taper as prescribed (Medrol Dosepak). 3.  Return to the ER for worsening symptoms, losing control of your bowel or bladder, extremity weakness or other concerns.

## 2020-03-02 NOTE — ED Provider Notes (Signed)
[facility] Emergency Department Provider Note   ____________________________________________   First MD Initiated Contact with Patient 03/02/20 0214     (approximate)  I have reviewed the triage vital signs and the nursing notes.   HISTORY  Chief Complaint Back Pain    HPI Toni Robertson is a 34 y.o. female who presents to the ED from home with a chief complaint of nontraumatic back pain.  Patient was seen at an [facility] several days ago with lumbosacral strain.  Prescribed Meloxicam and Flexeril for back pain/spasms but continues with persistent pain.  Patient started a new role at her job at the [employer] lifting very heavy objects and thinks this triggered her back pain.  Denies extremity weakness, bowel or bladder incontinence.  Describes shooting pain down her right buttock into her leg with some numbness.  Voices no other complaints or injuries.      Past Medical History:  Diagnosis Date  . Family history of adverse reaction to anesthesia    MATERNAL GRANDFATHER-HARD TO WAKE UP  . Fibromyalgia   . Headache    MIGRAINE  . History of kidney stones 06/2017  . History of methicillin resistant staphylococcus aureus (MRSA) 2013    There are no problems to display for this patient.   Past Surgical History:  Procedure Laterality Date  . IUD REMOVAL N/A 07/17/2017   Procedure: INTRAUTERINE DEVICE (IUD) REMOVAL;  Surgeon: Schermerhorn, Ihor Austin, MD;  Location: [facility] ORS;  Service: Gynecology;  Laterality: N/A;  . LAPAROSCOPIC TUBAL LIGATION Bilateral 07/17/2017   Procedure: LAPAROSCOPIC TUBAL LIGATION;  Surgeon: Feliberto Gottron, Ihor Austin, MD;  Location: [facility] ORS;  Service: Gynecology;  Laterality: Bilateral;    Prior to Admission medications   Medication Sig Start Date End Date Taking? Authorizing Provider  benzonatate (TESSALON PERLES) 100 MG capsule Take 2 capsules (200 mg total) by mouth 3 (three) times daily as needed. 06/14/19 06/13/20  Joni Reining,  PA-C  buPROPion (WELLBUTRIN XL) 300 MG 24 hr tablet Take by mouth. 01/28/18   [provider]  cyclobenzaprine (FLEXERIL) 10 MG tablet Take 1 tablet (10 mg total) by mouth 3 (three) times daily as needed for up to 7 days for muscle spasms. 02/27/20 03/05/20  Eusebio Friendly B, PA-C  diazepam (VALIUM) 2 MG tablet Take 1 tablet (2 mg total) by mouth every 8 (eight) hours as needed for muscle spasms. 03/02/20   Irean Hong, MD  diclofenac (VOLTAREN) 75 MG EC tablet Take 1 tablet (75 mg total) by mouth 2 (two) times daily for 10 days. 02/27/20 03/08/20  Eusebio Friendly B, PA-C  DULoxetine (CYMBALTA) 20 MG capsule Take 20 mg by mouth daily.    [provider]  fluticasone (FLONASE) 50 MCG/ACT nasal spray Place 2 sprays into both nostrils daily. 07/11/18 07/11/19  Enid Derry, PA-C  levonorgestrel (MIRENA) 20 MCG/24HR IUD 1 each by Intrauterine route once.    [provider]  methylPREDNISolone (MEDROL DOSEPAK) 4 MG TBPK tablet Take as directed 03/02/20   Irean Hong, MD  oxyCODONE-acetaminophen (PERCOCET/ROXICET) 5-325 MG tablet Take 1 tablet by mouth every 4 (four) hours as needed for severe pain. 03/02/20   Irean Hong, MD    Allergies Coconut flavor and Azithromycin  Family History  Problem Relation Age of Onset  . Hypertension Mother   . Diabetes Father     Social History Social History   Tobacco Use  . Smoking status: Never Smoker  . Smokeless tobacco: Never Used  Vaping Use  .  Vaping Use: Never used  Substance Use Topics  . Alcohol use: Yes    Comment: social  . Drug use: No    Review of Systems  Constitutional: No fever/chills Eyes: No visual changes. ENT: No sore throat. Cardiovascular: Denies chest pain. Respiratory: Denies shortness of breath. Gastrointestinal: No abdominal pain.  No nausea, no vomiting.  No diarrhea.  No constipation. Genitourinary: Negative for dysuria. Musculoskeletal: Positive for back pain. Skin: Negative for  rash. Neurological: Negative for headaches, focal weakness or numbness.   ____________________________________________   PHYSICAL EXAM:  VITAL SIGNS: ED Triage Vitals  Enc Vitals Group     BP 03/01/20 2323 (!) 139/96     Pulse Rate 03/01/20 2323 94     Resp 03/01/20 2323 20     Temp 03/01/20 2323 98.9 F (37.2 C)     Temp Source 03/01/20 2323 Oral     SpO2 03/01/20 2323 100 %     Weight 03/01/20 2316 264 lb 15.9 oz (120.2 kg)     Height 03/01/20 2316 5\' 4"  (1.626 m)     Head Circumference --      Peak Flow --      Pain Score 03/01/20 2316 9     Pain Loc --      Pain Edu? --      Excl. in GC? --     Constitutional: Alert and oriented. Well appearing and in no acute distress. Eyes: Conjunctivae are normal. PERRL. EOMI. Head: Atraumatic. Nose: No congestion/rhinnorhea. Mouth/Throat: Mucous membranes are moist.  Oropharynx non-erythematous. Neck: No stridor.   Cardiovascular: Normal rate, regular rhythm. Grossly normal heart sounds.  Good peripheral circulation. Respiratory: Normal respiratory effort.  No retractions. Lungs CTAB. Gastrointestinal: Soft and nontender. No distention. No abdominal bruits. No CVA tenderness. Musculoskeletal: No spinal tenderness to palpation.  Right lumbar paraspinal muscle spasms.  Negative straight leg raise.  No lower extremity tenderness nor edema.  No joint effusions. Neurologic:  Normal speech and language.  5/5 motor strength and sensation all extremities.  No gross focal neurologic deficits are appreciated. No gait instability. Skin:  Skin is warm, dry and intact. No rash noted. Psychiatric: Mood and affect are normal. Speech and behavior are normal.  ____________________________________________   LABS (all labs ordered are listed, but only abnormal results are displayed)  Labs Reviewed - No data to display ____________________________________________  EKG  None ____________________________________________  RADIOLOGY  ED MD  interpretation: None  Official radiology report(s): No results found.  ____________________________________________   PROCEDURES  Procedure(s) performed (including Critical Care):  Procedures   ____________________________________________   INITIAL IMPRESSION / ASSESSMENT AND PLAN / ED COURSE  As part of my medical decision making, I reviewed the following data within the electronic MEDICAL RECORD NUMBER Nursing notes reviewed and incorporated, Old chart reviewed, Notes from prior ED visits and  Controlled Substance Database     Toni Robertson was evaluated in Emergency Department on 03/02/2020 for the symptoms described in the history of present illness. She was evaluated in the context of the global COVID-19 pandemic, which necessitated consideration that the patient might be at risk for infection with the SARS-CoV-2 virus that causes COVID-19. Institutional protocols and algorithms that pertain to the evaluation of patients at risk for COVID-19 are in a state of rapid change based on information released by regulatory bodies including the CDC and federal and state organizations. These policies and algorithms were followed during the patient's care in the ED.    34 year old female presenting with persistent  lumbosacral pain and muscle spasms.  Clinically there is an element of sciatica.  Will replace Flexeril with Valium for muscle relaxation.  Add Percocet and Medrol Dosepak.  Will refer patient to orthopedics for follow-up.  Strict return precautions given.  Patient verbalizes understanding agrees with plan of care.      ____________________________________________   FINAL CLINICAL IMPRESSION(S) / ED DIAGNOSES  Final diagnoses:  Acute right-sided low back pain with right-sided sciatica     ED Discharge Orders         Ordered    oxyCODONE-acetaminophen (PERCOCET/ROXICET) 5-325 MG tablet  Every 4 hours PRN        03/02/20 0224    methylPREDNISolone (MEDROL DOSEPAK) 4 MG  TBPK tablet        03/02/20 0224    diazepam (VALIUM) 2 MG tablet  Every 8 hours PRN        03/02/20 0224           Note:  This document was prepared using Dragon voice recognition software and may include unintentional dictation errors.   Irean Hong, MD 03/02/20 [phone]

## 2020-03-02 NOTE — ED Notes (Signed)
Patient discharged to home per MD order. Patient in stable condition, and deemed medically cleared by ED provider for discharge. Discharge instructions reviewed with patient/family using "Teach Back"; verbalized understanding of medication education and administration, and information about follow-up care. Denies further concerns. ° °

## 2020-08-14 DIAGNOSIS — J309 Allergic rhinitis, unspecified: Secondary | ICD-10-CM | POA: Insufficient documentation

## 2021-01-01 ENCOUNTER — Other Ambulatory Visit: Payer: Self-pay

## 2021-01-01 ENCOUNTER — Ambulatory Visit
Admission: RE | Admit: 2021-01-01 | Discharge: 2021-01-01 | Disposition: A | Discharge status: Home / Self Care | Payer: BC Managed Care – PPO | Source: Ambulatory Visit | Attending: Emergency Medicine | Admitting: Emergency Medicine

## 2021-01-01 VITALS — BP 158/100 | HR 75 | Temp 98.6°F | Resp 18

## 2021-01-01 DIAGNOSIS — J351 Hypertrophy of tonsils: Secondary | ICD-10-CM

## 2021-01-01 DIAGNOSIS — R059 Cough, unspecified: Secondary | ICD-10-CM | POA: Diagnosis not present

## 2021-01-01 MED ORDER — DEXAMETHASONE SODIUM PHOSPHATE 10 MG/ML IJ SOLN
10.0000 mg | Freq: Once | INTRAMUSCULAR | Status: AC
  Administered 2021-01-01: 10 mg via INTRAMUSCULAR

## 2021-01-01 MED ORDER — HYDROCOD POLST-CPM POLST ER 10-8 MG/5ML PO SUER: 5.0000 mL | Freq: Two times a day (BID) | ORAL | 0 refills | Status: DC | PRN

## 2021-01-01 MED ORDER — BENZONATATE 200 MG PO CAPS: 200.0000 mg | ORAL_CAPSULE | Freq: Three times a day (TID) | ORAL | 0 refills | Status: DC | PRN

## 2021-01-01 MED ORDER — FAMOTIDINE 20 MG PO TABS: 20.0000 mg | ORAL_TABLET | Freq: Two times a day (BID) | ORAL | 0 refills | Status: AC

## 2021-01-01 MED ORDER — IBUPROFEN 600 MG PO TABS: 600.0000 mg | ORAL_TABLET | Freq: Four times a day (QID) | ORAL | 0 refills | Status: AC | PRN

## 2021-01-01 NOTE — ED Triage Notes (Signed)
Pt reports cough x 2 days.  Feels like something is in throat.  Had PCR COVID yesterday that was negative.  No nasal congestion, HA, fever, body aches, etc.  Pt states she feels fine, just feels like something stuck in throat.

## 2021-01-01 NOTE — Discharge Instructions (Addendum)
I have given you a shot of dexamethasone for tonsillar swelling.  Tessalon for cough during the day, Tussionex for the cough at night.  Pepcid in case this is caused by acid reflux.  May take 600 mg of ibuprofen combined with 1000 mg of Tylenol together 3-4 times a day as needed for pain, swelling.  Follow-up with your doctor ASAP.  You may need a sleep study and consultation with ENT.

## 2021-01-01 NOTE — ED Provider Notes (Signed)
HPI  SUBJECTIVE:  Toni Robertson is a 35 y.o. female who presents with 2 days of a cough productive of white phlegm.  She states that she feels as if "something is stuck in my throat".  She reports a sore throat secondary to the cough, and inability to sleep secondary to a cough.  States that she "kept getting choked".  States that her normally large tonsils have gotten bigger and is reporting difficulty swallowing, voice changes.  No drooling, trismus.  No chest congestion, wheezing or chest pain, shortness of breath, burning chest pain, belching, waterbrash, GERD symptoms, allergy symptoms.  No body aches, change in her baseline headache, fevers, nasal congestion, postnasal drip, loss of sense of smell or taste, nausea, vomiting, diarrhea, abdominal pain.  She states that she had a negative COVID PCR test yesterday.  She states that she snores.  She tried Mucinex, ibuprofen 400 mg.  Ibuprofen helps.  No aggravating factors.  She has a past medical history of enlarged tonsils, states that she was admitted for 7 days once received steroids for enlarged tonsils.  She also reports a history of allergies.  No history of GERD, asthma, smoking.  LMP: 2 days ago.  Denies the possibility being pregnant.  PMD: [facility].    Past Medical History:  Diagnosis Date   Family history of adverse reaction to anesthesia    MATERNAL GRANDFATHER-HARD TO WAKE UP   Fibromyalgia    Headache    MIGRAINE   History of kidney stones 06/2017   History of methicillin resistant staphylococcus aureus (MRSA) 2013    Past Surgical History:  Procedure Laterality Date   IUD REMOVAL N/A 07/17/2017   Procedure: INTRAUTERINE DEVICE (IUD) REMOVAL;  Surgeon: Suzy Bouchard, MD;  Location: [facility] ORS;  Service: Gynecology;  Laterality: N/A;   LAPAROSCOPIC TUBAL LIGATION Bilateral 07/17/2017   Procedure: LAPAROSCOPIC TUBAL LIGATION;  Surgeon: Schermerhorn, Ihor Austin, MD;  Location: [facility] OR[facility];  Service: Gynecology;   Laterality: Bilateral;    Family History  Problem Relation Age of Onset   Hypertension Mother    Diabetes Father     Social History   Tobacco Use   Smoking status: Never   Smokeless tobacco: Never  Vaping Use   Vaping Use: Never used  Substance Use Topics   Alcohol use: Yes    Comment: social   Drug use: No    No current facility-administered medications for this encounter.  Current Outpatient Medications:    benzonatate (TESSALON) 200 MG capsule, Take 1 capsule (200 mg total) by mouth 3 (three) times daily as needed for cough., Disp: 30 capsule, Rfl: 0   chlorpheniramine-HYDROcodone (TUSSIONEX PENNKINETIC ER) 10-8 MG/5ML SUER, Take 5 mLs by mouth every 12 (twelve) hours as needed for cough., Disp: 60 mL, Rfl: 0   famotidine (PEPCID) 20 MG tablet, Take 1 tablet (20 mg total) by mouth 2 (two) times daily., Disp: 40 tablet, Rfl: 0   gabapentin (NEURONTIN) 300 MG capsule, Take 300 mg by mouth 3 (three) times daily., Disp: , Rfl:    ibuprofen (ADVIL) 600 MG tablet, Take 1 tablet (600 mg total) by mouth every 6 (six) hours as needed., Disp: 30 tablet, Rfl: 0   montelukast (SINGULAIR) 10 MG tablet, Take 10 mg by mouth at bedtime., Disp: , Rfl:    buPROPion (WELLBUTRIN XL) 300 MG 24 hr tablet, Take by mouth., Disp: , Rfl:    DULoxetine (CYMBALTA) 20 MG capsule, Take 20 mg by mouth daily., Disp: , Rfl:  levonorgestrel (MIRENA) 20 MCG/24HR IUD, 1 each by Intrauterine route once., Disp: , Rfl:   Allergies  Allergen Reactions   Coconut Flavor Anaphylaxis   Azithromycin Itching     ROS  As noted in HPI.   Physical Exam  BP (!) 158/100 (BP Location: Right Arm)   Pulse 75   Temp 98.6 F (37 C) (Oral)   Resp 18   LMP 12/25/2020   SpO2 98%   Constitutional: Well developed, well nourished, no acute distress, coughing Eyes:  EOMI, conjunctiva normal bilaterally HENT: Normocephalic, atraumatic,mucus membranes moist.  No nasal congestion, sinus tenderness.  Very large, almost  kissing tonsils.  No erythema, exudates.  Uvula midline, normal size.  No drooling, trismus.  No muffled, hot potato voice. Neck: No cervical lymphadenopathy Respiratory: Normal inspiratory effort, lungs clear bilaterally, good air movement Cardiovascular: Normal rate, regular rhythm, no murmurs rubs or gallop GI: nondistended skin: No rash, skin intact Musculoskeletal: no deformities Neurologic: Alert & oriented x 3, no focal neuro deficits Psychiatric: Speech and behavior appropriate   ED Course   Medications  dexamethasone (DECADRON) injection 10 mg (10 mg Intramuscular Given 01/01/21 1824)    No orders of the defined types were placed in this encounter.   No results found for this or any previous visit (from the past 24 hour(s)). No results found.  ED Clinical Impression  1. Cough   2. Enlarged tonsils      ED Assessment/Plan  [facility] narcotic database reviewed.  No opiate prescriptions since August 21.  Patient has very enlarged tonsils, suspect that they have gotten worse from the coughing.  There is no evidence of PTA, RPA, epiglottitis, uvulitis.  Will give dexamethasone 10 mg IM x1 for the tonsillar swelling, send home with Tessalon, Tussionex, Tylenol/ibuprofen.  will also try some Pepcid in case the cough is being caused by GERD.  Follow-up with PMD ASAP.  May need sleep study and follow-up with ENT for tonsil removal.  ER return precautions given.  Discussed MDM, treatment plan, and plan for follow-up with patient. Discussed sn/sx that should prompt return to the ED. patient agrees with plan.   Meds ordered this encounter  Medications   dexamethasone (DECADRON) injection 10 mg   famotidine (PEPCID) 20 MG tablet    Sig: Take 1 tablet (20 mg total) by mouth 2 (two) times daily.    Dispense:  40 tablet    Refill:  0   ibuprofen (ADVIL) 600 MG tablet    Sig: Take 1 tablet (600 mg total) by mouth every 6 (six) hours as needed.    Dispense:  30 tablet     Refill:  0   chlorpheniramine-HYDROcodone (TUSSIONEX PENNKINETIC ER) 10-8 MG/5ML SUER    Sig: Take 5 mLs by mouth every 12 (twelve) hours as needed for cough.    Dispense:  60 mL    Refill:  0   benzonatate (TESSALON) 200 MG capsule    Sig: Take 1 capsule (200 mg total) by mouth 3 (three) times daily as needed for cough.    Dispense:  30 capsule    Refill:  0      *This clinic note was created using Scientist, clinical (histocompatibility and immunogenetics). Therefore, there may be occasional mistakes despite careful proofreading.  ?    Domenick Gong, MD 01/01/21 [phone]

## 2021-01-02 ENCOUNTER — Emergency Department: Payer: BC Managed Care – PPO

## 2021-01-02 ENCOUNTER — Encounter: Payer: Self-pay | Admitting: Emergency Medicine

## 2021-01-02 ENCOUNTER — Emergency Department
Admission: EM | Admit: 2021-01-02 | Discharge: 2021-01-02 | Disposition: A | Discharge status: Home / Self Care | Payer: BC Managed Care – PPO | Attending: Emergency Medicine | Admitting: Emergency Medicine

## 2021-01-02 ENCOUNTER — Other Ambulatory Visit: Payer: Self-pay

## 2021-01-02 DIAGNOSIS — J351 Hypertrophy of tonsils: Secondary | ICD-10-CM | POA: Diagnosis not present

## 2021-01-02 DIAGNOSIS — R059 Cough, unspecified: Secondary | ICD-10-CM | POA: Diagnosis present

## 2021-01-02 DIAGNOSIS — J069 Acute upper respiratory infection, unspecified: Secondary | ICD-10-CM | POA: Diagnosis not present

## 2021-01-02 IMAGING — CR DG CHEST 2V
2 series · 2 of 2 positions shown · non-contrast
Comparison: [DATE]

CLINICAL DATA: Productive cough since [REDACTED]

EXAM:
CHEST - 2 VIEW

[chest pa]
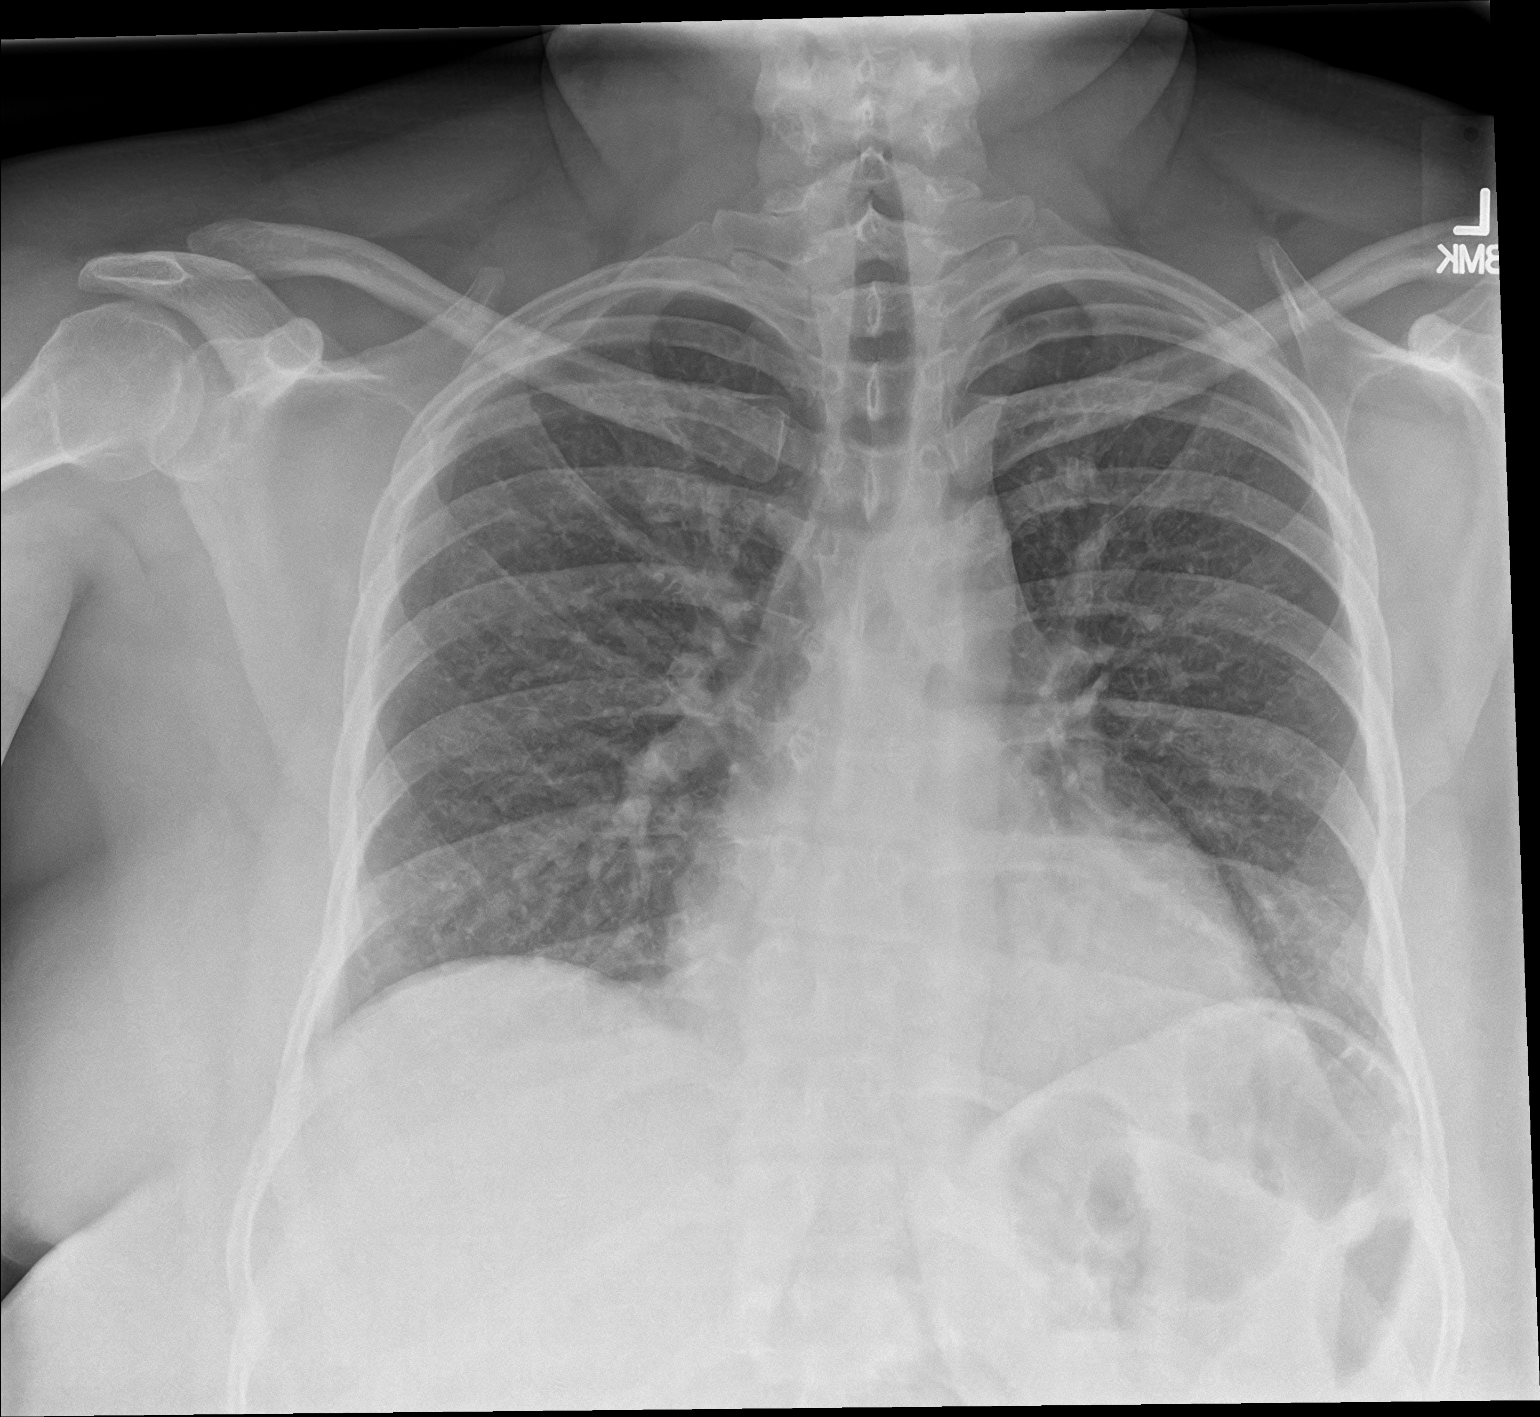

[chest lat]
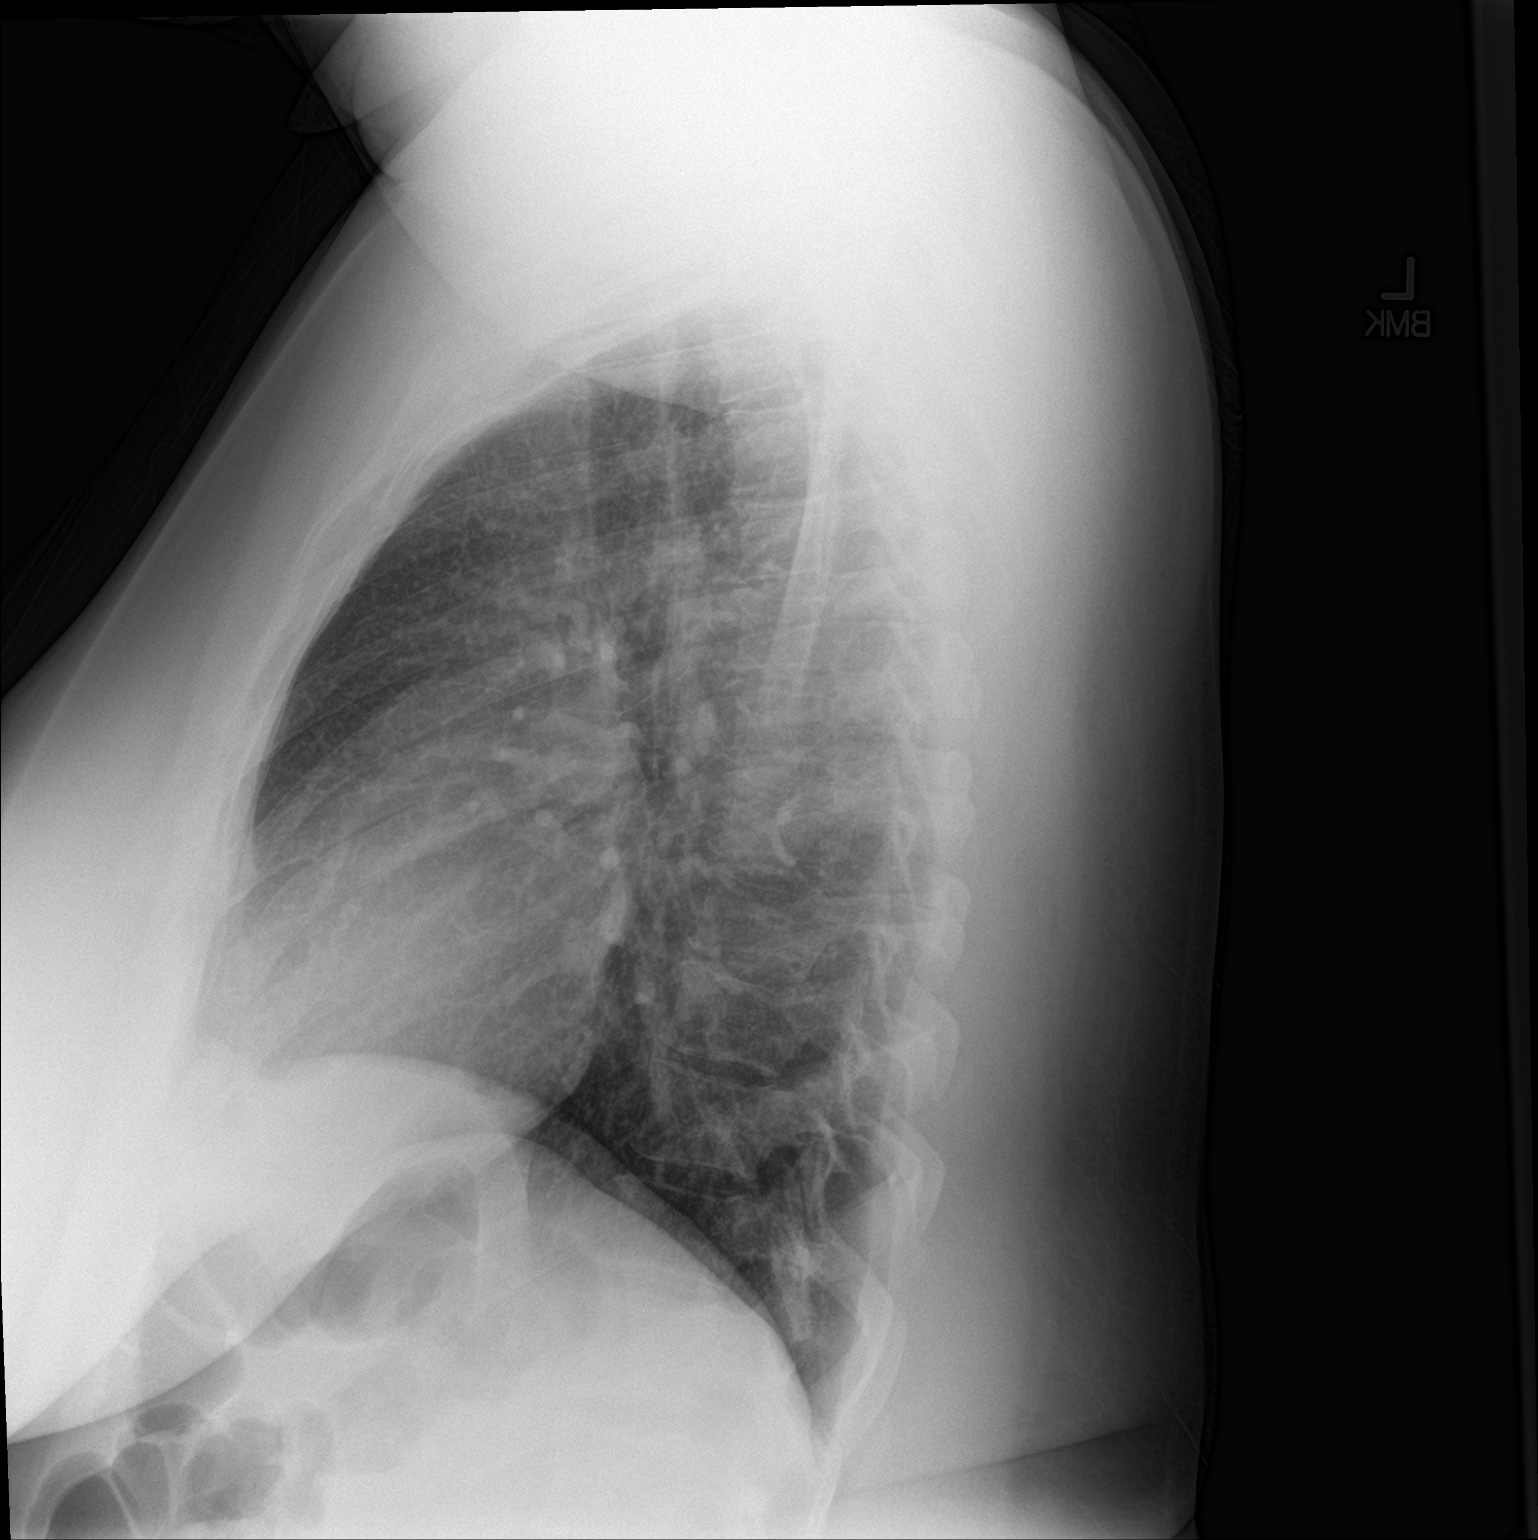

[2 of 2 positions shown; findings below may reference images not displayed]

FINDINGS: Normal heart size and mediastinal contours. Stable lung markings. No
acute infiltrate or edema. No effusion or pneumothorax. No acute
osseous findings.
IMPRESSION: Stable from prior.  Negative for pneumonia.

## 2021-01-02 MED ORDER — AMOXICILLIN-POT CLAVULANATE 600-42.9 MG/5ML PO SUSR: 600.0000 mg | Freq: Two times a day (BID) | ORAL | 0 refills | Status: DC

## 2021-01-02 MED ORDER — MAGIC MOUTHWASH
10.0000 mL | Freq: Once | ORAL | Status: AC
  Administered 2021-01-02: 10 mL via ORAL
  Filled 2021-01-02: qty 10

## 2021-01-02 MED ORDER — ALBUTEROL SULFATE (2.5 MG/3ML) 0.083% IN NEBU
2.5000 mg | INHALATION_SOLUTION | Freq: Four times a day (QID) | RESPIRATORY_TRACT | 12 refills | Status: DC | PRN
Start: 2021-01-02 — End: 2022-06-09

## 2021-01-02 MED ORDER — COMPRESSOR/NEBULIZER MISC: 1.0000 [IU] | 0 refills | Status: AC | PRN

## 2021-01-02 MED ORDER — AMOXICILLIN-POT CLAVULANATE 400-57 MG/5ML PO SUSR
600.0000 mg | Freq: Once | ORAL | Status: AC
  Administered 2021-01-02: 600 mg via ORAL
  Filled 2021-01-02: qty 7.5

## 2021-01-02 MED ORDER — PREDNISONE 20 MG PO TABS: ORAL_TABLET | ORAL | 0 refills | Status: DC

## 2021-01-02 MED ORDER — MAGIC MOUTHWASH: 5.0000 mL | Freq: Three times a day (TID) | ORAL | 0 refills | Status: AC | PRN

## 2021-01-02 MED ORDER — PREDNISONE 20 MG PO TABS
60.0000 mg | ORAL_TABLET | Freq: Once | ORAL | Status: AC
  Administered 2021-01-02: 60 mg via ORAL
  Filled 2021-01-02: qty 3

## 2021-01-02 NOTE — Discharge Instructions (Addendum)
1.  Take Augmentin liquid twice daily x7 days. 2.  You may use Magic mouthwash as needed for throat discomfort. 3.  Take Prednisone 60 mg daily x4 days.  Start your next dose Thursday morning. 4.  You may use Albuterol nebulizer every 4 hours as needed for cough/difficulty breathing. 5.  Return to the ER for worsening symptoms, persistent vomiting, difficulty breathing or other concerns.

## 2021-01-02 NOTE — ED Triage Notes (Signed)
Patient ambulatory to triage with steady gait, without difficulty or distress noted; pt reports prod cough white sputum since Sunday and diff swallowing; st neg COVID test at work; seen at urgent care yesterday and received meds for cough suppressant without relief

## 2021-01-02 NOTE — ED Provider Notes (Signed)
[facility] Emergency Department Provider Note   ____________________________________________   Event Date/Time   First MD Initiated Contact with Patient 01/02/21 [phone]     (approximate)  I have reviewed the triage vital signs and the nursing notes.   HISTORY  Chief Complaint Cough    HPI Toni Robertson is a 35 y.o. female who presents to the ED from home with a chief complaint of cough, difficulty swallowing, sore throat, enlarged tonsils.  Symptoms x3 days.  She was seen at urgent care yesterday and prescribed Tessalon, Tussionex, and famotidine.  Given IM Decadron at urgent care.  Had negative COVID PCR test at work 2 days ago.  Reports continued sore throat and cough.  Denies fever, chest pain, shortness of breath, abdominal pain, nausea, vomiting or dizziness.  States she has a history of enlarged tonsils.  She required hospitalization once for enlarged tonsils, states 7 days with a full work-up including EBV.  Tonsils were not surgically removed.     Past Medical History:  Diagnosis Date   Family history of adverse reaction to anesthesia    MATERNAL GRANDFATHER-HARD TO WAKE UP   Fibromyalgia    Headache    MIGRAINE   History of kidney stones 06/2017   History of methicillin resistant staphylococcus aureus (MRSA) 2013    There are no problems to display for this patient.   Past Surgical History:  Procedure Laterality Date   IUD REMOVAL N/A 07/17/2017   Procedure: INTRAUTERINE DEVICE (IUD) REMOVAL;  Surgeon: Schermerhorn, Ihor Austin, MD;  Location: [facility] ORS;  Service: Gynecology;  Laterality: N/A;   LAPAROSCOPIC TUBAL LIGATION Bilateral 07/17/2017   Procedure: LAPAROSCOPIC TUBAL LIGATION;  Surgeon: Schermerhorn, Ihor Austin, MD;  Location: [facility] ORS;  Service: Gynecology;  Laterality: Bilateral;    Prior to Admission medications   Medication Sig Start Date End Date Taking? Authorizing Provider  albuterol (PROVENTIL) (2.5 MG/3ML) 0.083% nebulizer  solution Take 3 mLs (2.5 mg total) by nebulization every 6 (six) hours as needed for wheezing or shortness of breath. 01/02/21  Yes Irean Hong, MD  amoxicillin-clavulanate (AUGMENTIN ES-600) 600-42.9 MG/5ML suspension Take 5 mLs (600 mg total) by mouth every 12 (twelve) hours for 7 days. 01/02/21 01/09/21 Yes Irean Hong, MD  magic mouthwash SOLN Take 5 mLs by mouth 3 (three) times daily as needed for mouth pain. 01/02/21  Yes Irean Hong, MD  Nebulizers (COMPRESSOR/NEBULIZER) MISC 1 Units by Does not apply route every 4 (four) hours as needed. 01/02/21  Yes Irean Hong, MD  predniSONE (DELTASONE) 20 MG tablet 3 tablets daily x 4 days 01/02/21  Yes Irean Hong, MD  benzonatate (TESSALON) 200 MG capsule Take 1 capsule (200 mg total) by mouth 3 (three) times daily as needed for cough. 01/01/21   Domenick Gong, MD  buPROPion (WELLBUTRIN XL) 300 MG 24 hr tablet Take by mouth. 01/28/18   [provider]  chlorpheniramine-HYDROcodone (TUSSIONEX PENNKINETIC ER) 10-8 MG/5ML SUER Take 5 mLs by mouth every 12 (twelve) hours as needed for cough. 01/01/21   Domenick Gong, MD  DULoxetine (CYMBALTA) 20 MG capsule Take 20 mg by mouth daily.    [provider]  famotidine (PEPCID) 20 MG tablet Take 1 tablet (20 mg total) by mouth 2 (two) times daily. 01/01/21   Domenick Gong, MD  gabapentin (NEURONTIN) 300 MG capsule Take 300 mg by mouth 3 (three) times daily.    [provider]  ibuprofen (ADVIL) 600 MG tablet Take 1 tablet (600  mg total) by mouth every 6 (six) hours as needed. 01/01/21   Domenick Gong, MD  levonorgestrel (MIRENA) 20 MCG/24HR IUD 1 each by Intrauterine route once.    [provider]  montelukast (SINGULAIR) 10 MG tablet Take 10 mg by mouth at bedtime.    [provider]    Allergies Coconut flavor and Azithromycin  Family History  Problem Relation Age of Onset   Hypertension Mother    Diabetes Father     Social History Social History    Tobacco Use   Smoking status: Never   Smokeless tobacco: Never  Vaping Use   Vaping Use: Never used  Substance Use Topics   Alcohol use: Yes    Comment: social   Drug use: No    Review of Systems  Constitutional: No fever/chills Eyes: No visual changes. ENT: Positive for sore throat. Cardiovascular: Denies chest pain. Respiratory: Positive for cough.  Denies shortness of breath. Gastrointestinal: No abdominal pain.  No nausea, no vomiting.  No diarrhea.  No constipation. Genitourinary: Negative for dysuria. Musculoskeletal: Negative for back pain. Skin: Negative for rash. Neurological: Negative for headaches, focal weakness or numbness.   ____________________________________________   PHYSICAL EXAM:  VITAL SIGNS: ED Triage Vitals  Enc Vitals Group     BP 01/02/21 0344 (!) 155/101     Pulse Rate 01/02/21 0344 83     Resp 01/02/21 0344 20     Temp 01/02/21 0344 98.4 F (36.9 C)     Temp Source 01/02/21 0344 Oral     SpO2 01/02/21 0344 97 %     Weight 01/02/21 0345 260 lb (117.9 kg)     Height 01/02/21 0345 5\' 4"  (1.626 m)     Head Circumference --      Peak Flow --      Pain Score 01/02/21 0345 0     Pain Loc --      Pain Edu? --      Excl. in GC? --     Constitutional: Alert and oriented. Well appearing and in no acute distress. Eyes: Conjunctivae are normal. PERRL. EOMI. Head: Atraumatic. Nose: No congestion/rhinnorhea. Mouth/Throat: Mucous membranes are moist.  Oropharynx non-erythematous.  Symmetrically enlarged tonsils without exudates or peritonsillar abscess.  There is no hoarse or muffled voice.  There is no drooling. Neck: No stridor.  Supple neck without meningismus. Hematological/Lymphatic/Immunilogical: No cervical lymphadenopathy. Cardiovascular: Normal rate, regular rhythm. Grossly normal heart sounds.  Good peripheral circulation. Respiratory: Normal respiratory effort.  No retractions. Lungs CTAB.  Active dry cough noted. Gastrointestinal:  Soft and nontender. No distention. No abdominal bruits. No CVA tenderness. Musculoskeletal: No lower extremity tenderness nor edema.  No joint effusions. Neurologic:  Normal speech and language. No gross focal neurologic deficits are appreciated. No gait instability. Skin:  Skin is warm, dry and intact. No rash noted.  No petechiae. Psychiatric: Mood and affect are normal. Speech and behavior are normal.  ____________________________________________   LABS (all labs ordered are listed, but only abnormal results are displayed)  Labs Reviewed - No data to display ____________________________________________  EKG  None ____________________________________________  RADIOLOGY I, Tennelle Taflinger J, personally viewed and evaluated these images (plain radiographs) as part of my medical decision making, as well as reviewing the written report by the radiologist.  ED MD interpretation:  No acute cardiopulmonary process  Official radiology report(s): DG Chest 2 View  Result Date: 01/02/2021 CLINICAL DATA:  Productive cough since Sunday EXAM: CHEST - 2 VIEW COMPARISON:  06/14/2019 FINDINGS: Normal heart  size and mediastinal contours. Stable lung markings. No acute infiltrate or edema. No effusion or pneumothorax. No acute osseous findings. IMPRESSION: Stable from prior.  Negative for pneumonia. Electronically Signed   By: Marnee Spring M.D.   On: 01/02/2021 04:47    ____________________________________________   PROCEDURES  Procedure(s) performed (including Critical Care):  Procedures   ____________________________________________   INITIAL IMPRESSION / ASSESSMENT AND PLAN / ED COURSE  As part of my medical decision making, I reviewed the following data within the electronic MEDICAL RECORD NUMBER Nursing notes reviewed and incorporated, Old chart reviewed, Radiograph reviewed, and Notes from prior ED visits     35 year old female presenting with URI symptoms.  Will place on Prednisone burst  for enlarged tonsils, Augmentin liquid, Magic mouthwash.  Albuterol nebulizer for cough.  Strict return precautions given.  Patient verbalizes understanding agrees with plan of care.      ____________________________________________   FINAL CLINICAL IMPRESSION(S) / ED DIAGNOSES  Final diagnoses:  Upper respiratory tract infection, unspecified type  Enlarged tonsils     ED Discharge Orders          Ordered    amoxicillin-clavulanate (AUGMENTIN ES-600) 600-42.9 MG/5ML suspension  Every 12 hours        01/02/21 0434    magic mouthwash SOLN  3 times daily PRN        01/02/21 0434    albuterol (PROVENTIL) (2.5 MG/3ML) 0.083% nebulizer solution  Every 6 hours PRN        01/02/21 0434    Nebulizers (COMPRESSOR/NEBULIZER) MISC  Every 4 hours PRN        01/02/21 0434    predniSONE (DELTASONE) 20 MG tablet        01/02/21 0435             Note:  This document was prepared using Dragon voice recognition software and may include unintentional dictation errors.    Irean Hong, MD 01/02/21 [phone]

## 2021-01-03 ENCOUNTER — Encounter: Payer: Self-pay | Admitting: Emergency Medicine

## 2021-01-03 ENCOUNTER — Other Ambulatory Visit: Payer: Self-pay

## 2021-01-03 ENCOUNTER — Emergency Department
Admission: EM | Admit: 2021-01-03 | Discharge: 2021-01-03 | Disposition: A | Discharge status: Home / Self Care | Payer: BC Managed Care – PPO | Attending: Emergency Medicine | Admitting: Emergency Medicine

## 2021-01-03 ENCOUNTER — Emergency Department: Payer: BC Managed Care – PPO

## 2021-01-03 DIAGNOSIS — R059 Cough, unspecified: Secondary | ICD-10-CM | POA: Diagnosis present

## 2021-01-03 DIAGNOSIS — R5383 Other fatigue: Secondary | ICD-10-CM | POA: Diagnosis not present

## 2021-01-03 DIAGNOSIS — J069 Acute upper respiratory infection, unspecified: Secondary | ICD-10-CM | POA: Insufficient documentation

## 2021-01-03 DIAGNOSIS — U071 COVID-19: Secondary | ICD-10-CM | POA: Insufficient documentation

## 2021-01-03 LAB — BASIC METABOLIC PANEL
Anion gap: 6 (ref 5–15)
BUN: 16 mg/dL (ref 6–20)
CO2: 25 mmol/L (ref 22–32)
Calcium: 9.1 mg/dL (ref 8.9–10.3)
Chloride: 107 mmol/L (ref 98–111)
Creatinine, Ser: 0.86 mg/dL (ref 0.44–1.00)
GFR, Estimated: >60 mL/min (ref >60)
Glucose, Bld: 127 mg/dL — ABNORMAL HIGH (ref 70–99)
Potassium: 3.7 mmol/L (ref 3.5–5.1)
Sodium: 138 mmol/L (ref 135–145)

## 2021-01-03 LAB — CBC WITH DIFFERENTIAL/PLATELET
Abs Immature Granulocytes: 0.1 10*3/uL — ABNORMAL HIGH (ref 0.00–0.07)
Basophils Absolute: 0 10*3/uL (ref 0.0–0.1)
Basophils Relative: 0 %
Eosinophils Absolute: 0 10*3/uL (ref 0.0–0.5)
Eosinophils Relative: 0 %
HCT: 40.9 % (ref 36.0–46.0)
Hemoglobin: 13.6 g/dL (ref 12.0–15.0)
Immature Granulocytes: 1 %
Lymphocytes Relative: 9 %
Lymphs Abs: 1.1 10*3/uL (ref 0.7–4.0)
MCH: 27.4 pg (ref 26.0–34.0)
MCHC: 33.3 g/dL (ref 30.0–36.0)
MCV: 82.5 fL (ref 80.0–100.0)
Monocytes Absolute: 0.5 10*3/uL (ref 0.1–1.0)
Monocytes Relative: 4 %
Neutro Abs: 11.1 10*3/uL — ABNORMAL HIGH (ref 1.7–7.7)
Neutrophils Relative %: 86 %
Platelets: 457 10*3/uL — ABNORMAL HIGH (ref 150–400)
RBC: 4.96 MIL/uL (ref 3.87–5.11)
RDW: 14 % (ref 11.5–15.5)
WBC: 12.7 10*3/uL — ABNORMAL HIGH (ref 4.0–10.5)
nRBC: 0 % (ref 0.0–0.2)

## 2021-01-03 LAB — URINALYSIS, COMPLETE (UACMP) WITH MICROSCOPIC
Bacteria, UA: NONE SEEN
Bilirubin Urine: NEGATIVE
Glucose, UA: NEGATIVE mg/dL
Ketones, ur: NEGATIVE mg/dL
Nitrite: NEGATIVE
Protein, ur: 30 mg/dL — AB
Specific Gravity, Urine: 1.034 — ABNORMAL HIGH (ref 1.005–1.030)
pH: 5 (ref 5.0–8.0)

## 2021-01-03 LAB — GROUP A STREP BY PCR: Group A Strep by PCR: NOT DETECTED

## 2021-01-03 LAB — SARS CORONAVIRUS 2 (TAT 6-24 HRS): SARS Coronavirus 2: POSITIVE — AB

## 2021-01-03 IMAGING — CR DG NECK SOFT TISSUE
2 series · 2 of 2 positions shown · non-contrast
Comparison: None.

CLINICAL DATA: Difficulty swallowing for several days.

EXAM:
NECK SOFT TISSUES - 1+ VIEW

[neck lat]
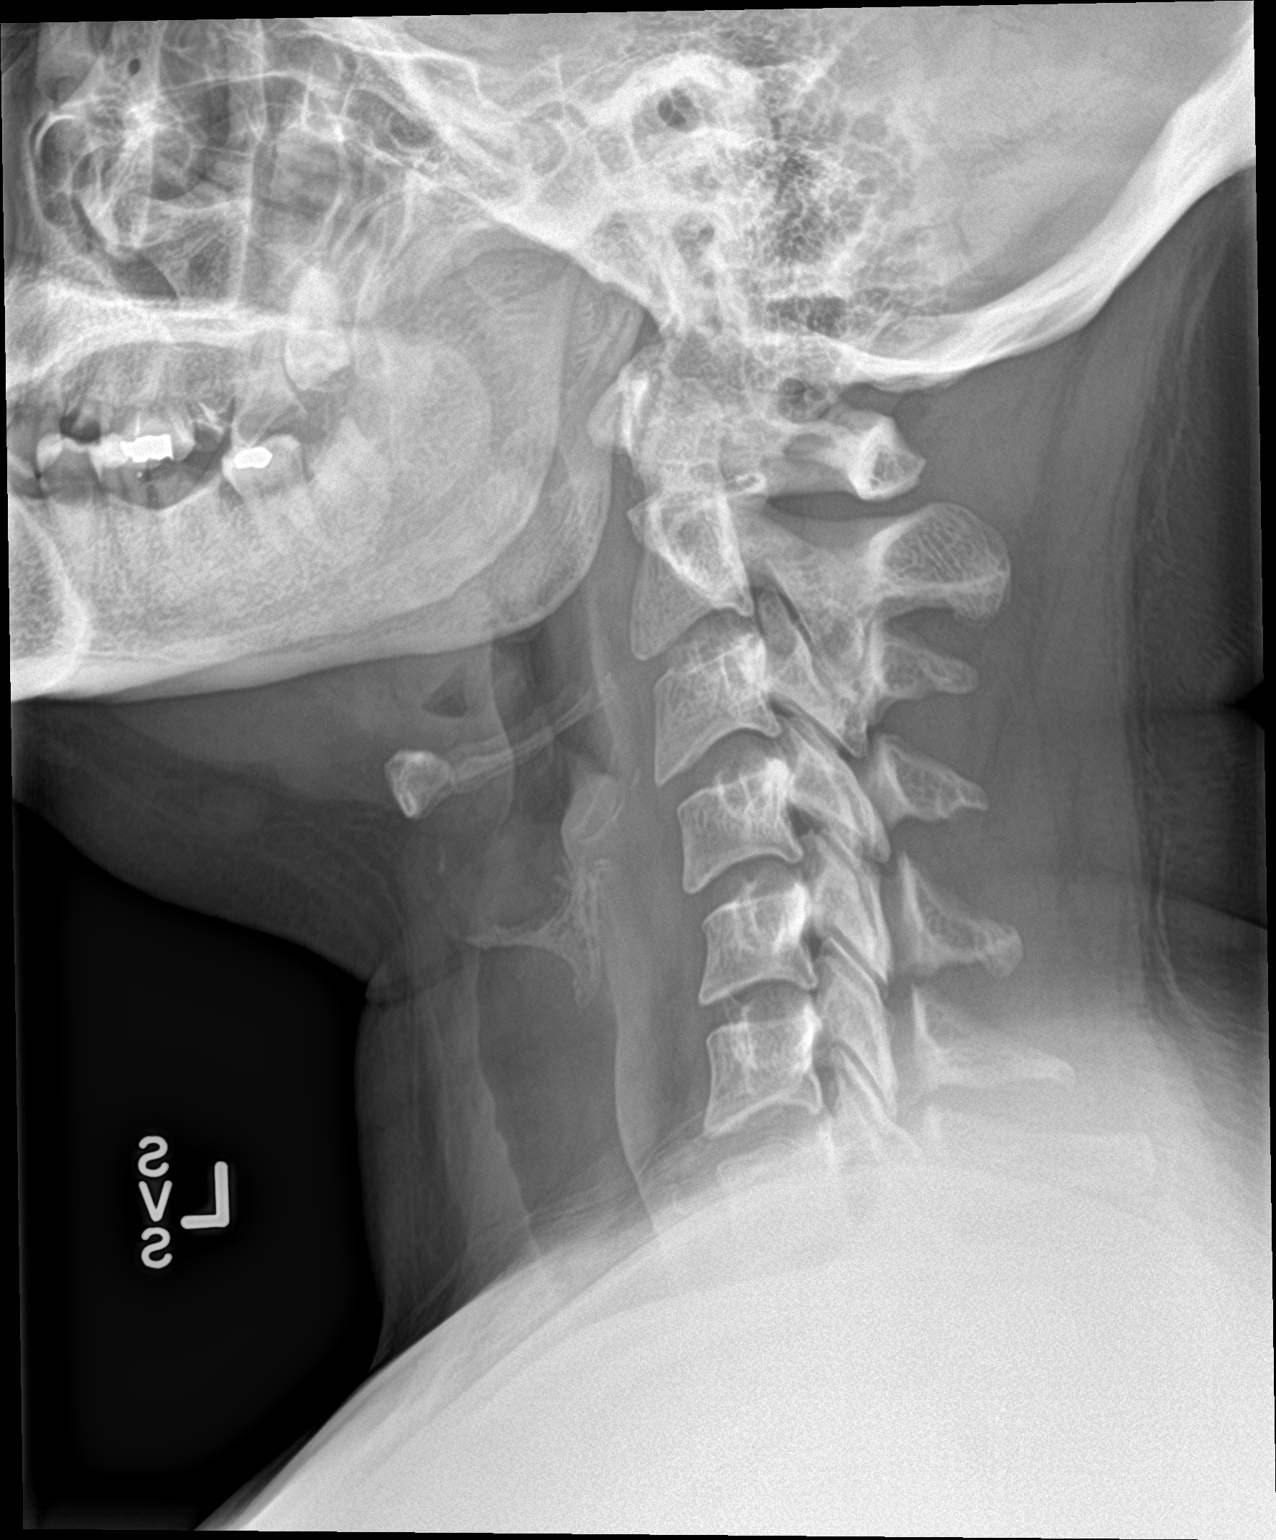

[neck ap]
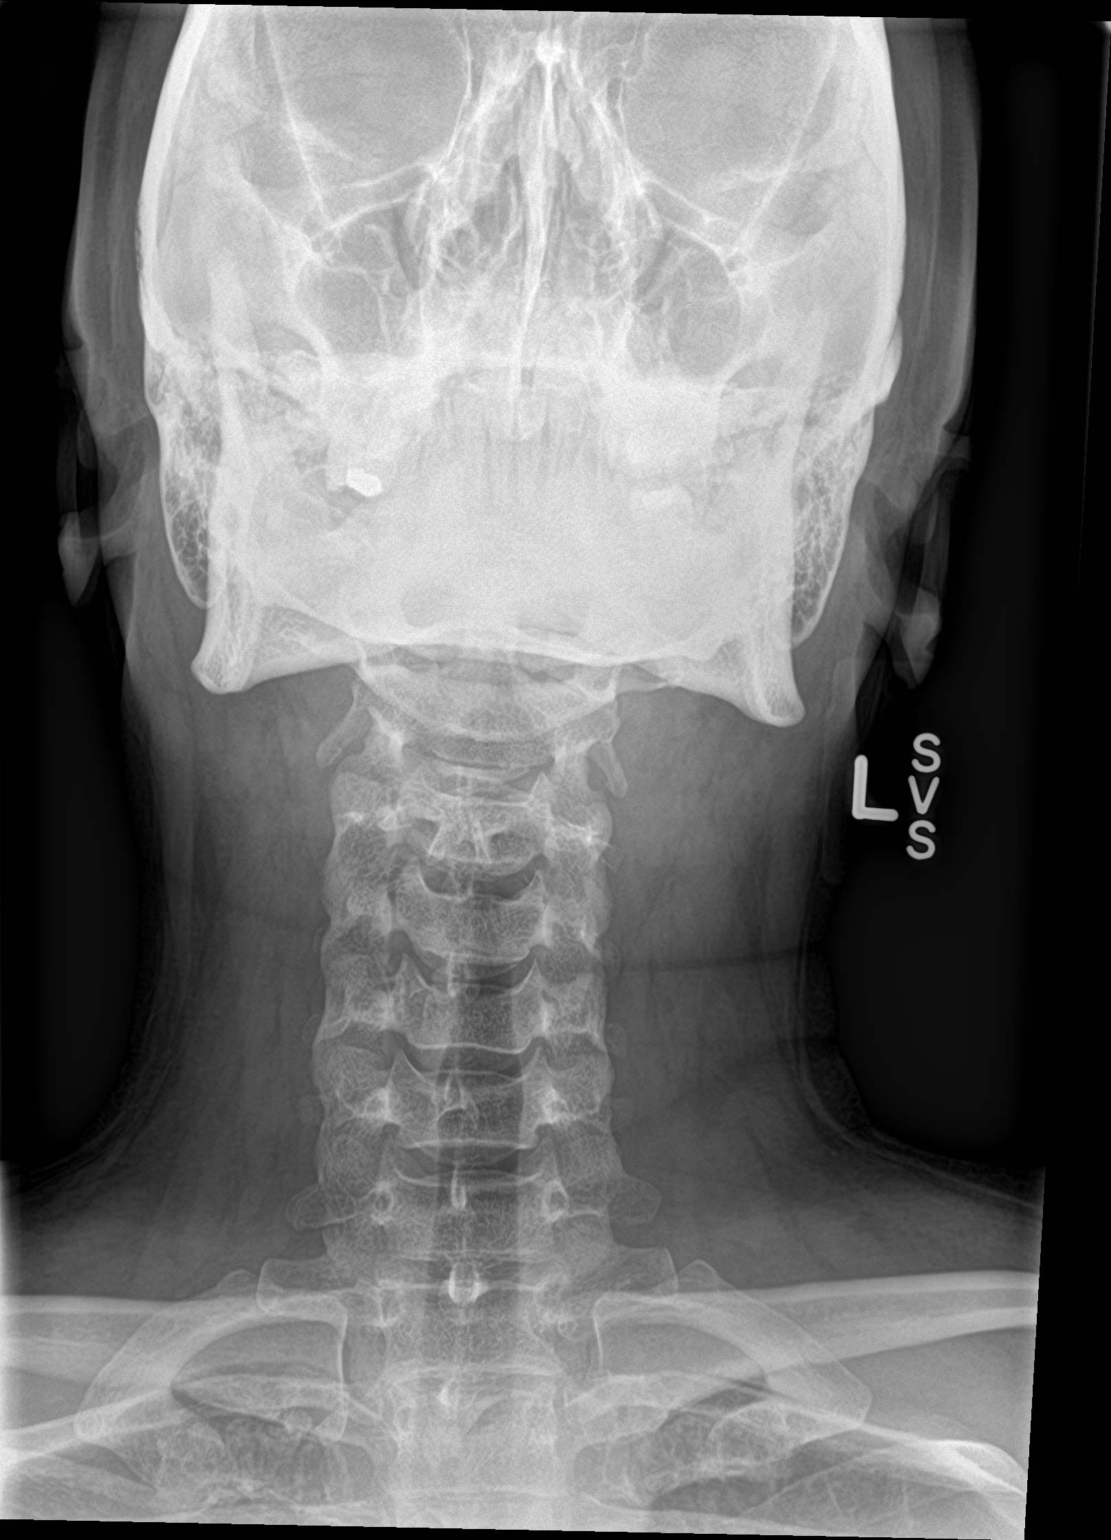

[2 of 2 positions shown; findings below may reference images not displayed]

FINDINGS: There is no evidence of retropharyngeal soft tissue swelling or
epiglottic enlargement. The cervical airway is unremarkable and no
radio-opaque foreign body identified.
IMPRESSION: Negative.

## 2021-01-03 MED ORDER — LIDOCAINE VISCOUS HCL 2 % MT SOLN: 5.0000 mL | Freq: Four times a day (QID) | OROMUCOSAL | 0 refills | Status: DC | PRN

## 2021-01-03 MED ORDER — KETOROLAC TROMETHAMINE 30 MG/ML IJ SOLN
30.0000 mg | Freq: Once | INTRAMUSCULAR | Status: AC
  Administered 2021-01-03: 30 mg via INTRAVENOUS
  Filled 2021-01-03: qty 1

## 2021-01-03 MED ORDER — ONDANSETRON HCL 4 MG/2ML IJ SOLN
4.0000 mg | Freq: Once | INTRAMUSCULAR | Status: AC
  Administered 2021-01-03: 4 mg via INTRAVENOUS
  Filled 2021-01-03: qty 2

## 2021-01-03 MED ORDER — HYDROCOD POLST-CPM POLST ER 10-8 MG/5ML PO SUER: 5.0000 mL | Freq: Two times a day (BID) | ORAL | 0 refills | Status: DC

## 2021-01-03 MED ORDER — SODIUM CHLORIDE 0.9 % IV BOLUS
1000.0000 mL | Freq: Once | INTRAVENOUS | Status: AC
  Administered 2021-01-03: 1000 mL via INTRAVENOUS

## 2021-01-03 MED ORDER — DIPHENHYDRAMINE HCL 12.5 MG/5ML PO ELIX
25.0000 mg | ORAL_SOLUTION | Freq: Once | ORAL | Status: AC
  Administered 2021-01-03: 25 mg via ORAL
  Filled 2021-01-03: qty 10

## 2021-01-03 MED ORDER — LIDOCAINE VISCOUS HCL 2 % MT SOLN
15.0000 mL | Freq: Once | OROMUCOSAL | Status: AC
  Administered 2021-01-03: 15 mL via OROMUCOSAL
  Filled 2021-01-03: qty 15

## 2021-01-03 NOTE — ED Provider Notes (Signed)
[facility] Emergency Department Provider Note   ____________________________________________   Event Date/Time   First MD Initiated Contact with Patient 01/03/21 1139     (approximate)  I have reviewed the triage vital signs and the nursing notes.   HISTORY  Chief Complaint Cough    HPI Toni Robertson is a 35 y.o. female patient presents to ED for complaint of cough and loss of taste.  This is patient's third visit this week secondary to viral illness.  Patient states she tested negative for COVID-19 at a job site which requires testing twice a week.  Patient was seen earlier this week at urgent care clinic and was diagnosed with viral illness with cough.  She was given cough suppressants consistent with Tessalon Perles 2 times a day and Tussionex to be taken at night.  Patient also given prescription for prednisone.  Patient stated patient tested positive for COVID-19 yesterday who was in her caseload.  Patient state loss of taste is a new complaint which started today.  Patient has noticed a decreased appetite for the past 4 days.  Patient states he is feeling fatigued with increased body aches.  Patient stated she had a chest x-ray yesterday at this facility was negative for bronchitis or pneumonia.  Patient she is frustrated at her inability to get better and increased symptoms.  Patient rates her pain as 8/10.  Described pain as "achy".         Past Medical History:  Diagnosis Date   Family history of adverse reaction to anesthesia    MATERNAL GRANDFATHER-HARD TO WAKE UP   Fibromyalgia    Headache    MIGRAINE   History of kidney stones 06/2017   History of methicillin resistant staphylococcus aureus (MRSA) 2013    There are no problems to display for this patient.   Past Surgical History:  Procedure Laterality Date   IUD REMOVAL N/A 07/17/2017   Procedure: INTRAUTERINE DEVICE (IUD) REMOVAL;  Surgeon: Schermerhorn, Ihor Austinhomas J, MD;  Location: [facility]  ORS;  Service: Gynecology;  Laterality: N/A;   LAPAROSCOPIC TUBAL LIGATION Bilateral 07/17/2017   Procedure: LAPAROSCOPIC TUBAL LIGATION;  Surgeon: Schermerhorn, Ihor Austinhomas J, MD;  Location: [facility] ORS;  Service: Gynecology;  Laterality: Bilateral;    Prior to Admission medications   Medication Sig Start Date End Date Taking? Authorizing Provider  chlorpheniramine-HYDROcodone (TUSSIONEX PENNKINETIC ER) 10-8 MG/5ML SUER Take 5 mLs by mouth 2 (two) times daily. 01/03/21  Yes Joni ReiningSmith, Cortina Vultaggio K, PA-C  lidocaine (XYLOCAINE) 2 % solution Use as directed 5 mLs in the mouth or throat every 6 (six) hours as needed for mouth pain. For swish and swallow. 01/03/21  Yes Joni ReiningSmith, Aaren Krog K, PA-C  albuterol (PROVENTIL) (2.5 MG/3ML) 0.083% nebulizer solution Take 3 mLs (2.5 mg total) by nebulization every 6 (six) hours as needed for wheezing or shortness of breath. 01/02/21   Irean HongSung, Jade J, MD  amoxicillin-clavulanate (AUGMENTIN ES-600) 600-42.9 MG/5ML suspension Take 5 mLs (600 mg total) by mouth every 12 (twelve) hours for 7 days. 01/02/21 01/09/21  Irean HongSung, Jade J, MD  benzonatate (TESSALON) 200 MG capsule Take 1 capsule (200 mg total) by mouth 3 (three) times daily as needed for cough. 01/01/21   Domenick GongMortenson, Ashley, MD  buPROPion (WELLBUTRIN XL) 300 MG 24 hr tablet Take by mouth. 01/28/18   [provider]  chlorpheniramine-HYDROcodone (TUSSIONEX PENNKINETIC ER) 10-8 MG/5ML SUER Take 5 mLs by mouth every 12 (twelve) hours as needed for cough. 01/01/21   Domenick GongMortenson, Ashley, MD  DULoxetine (  CYMBALTA) 20 MG capsule Take 20 mg by mouth daily.    [provider]  famotidine (PEPCID) 20 MG tablet Take 1 tablet (20 mg total) by mouth 2 (two) times daily. 01/01/21   Domenick Gong, MD  gabapentin (NEURONTIN) 300 MG capsule Take 300 mg by mouth 3 (three) times daily.    [provider]  ibuprofen (ADVIL) 600 MG tablet Take 1 tablet (600 mg total) by mouth every 6 (six) hours as needed. 01/01/21   Domenick Gong, MD   levonorgestrel (MIRENA) 20 MCG/24HR IUD 1 each by Intrauterine route once.    [provider]  magic mouthwash SOLN Take 5 mLs by mouth 3 (three) times daily as needed for mouth pain. 01/02/21   Irean Hong, MD  montelukast (SINGULAIR) 10 MG tablet Take 10 mg by mouth at bedtime.    [provider]  Nebulizers (COMPRESSOR/NEBULIZER) MISC 1 Units by Does not apply route every 4 (four) hours as needed. 01/02/21   Irean Hong, MD  predniSONE (DELTASONE) 20 MG tablet 3 tablets daily x 4 days 01/02/21   Irean Hong, MD    Allergies Coconut flavor and Azithromycin  Family History  Problem Relation Age of Onset   Hypertension Mother    Diabetes Father     Social History Social History   Tobacco Use   Smoking status: Never   Smokeless tobacco: Never  Vaping Use   Vaping Use: Never used  Substance Use Topics   Alcohol use: Yes    Comment: social   Drug use: No    Review of Systems  Constitutional: No fever/chills Eyes: No visual changes. ENT: Sore throat and loss of taste.   Cardiovascular: Denies chest pain. Respiratory: Denies shortness of breath. Gastrointestinal: No abdominal pain.  No nausea, no vomiting.  No diarrhea.  No constipation. Genitourinary: Negative for dysuria. Musculoskeletal: Negative for back pain. Skin: Negative for rash. Neurological: Negative for headaches, focal weakness or numbness. Allergic/Immunilogical: Coconut and azithromycin. ____________________________________________   PHYSICAL EXAM:  VITAL SIGNS: ED Triage Vitals  Enc Vitals Group     BP 01/03/21 1135 (!) 161/101     Pulse Rate 01/03/21 1135 (!) 113     Resp 01/03/21 1135 19     Temp 01/03/21 1135 98.8 F (37.1 C)     Temp Source 01/03/21 1135 Oral     SpO2 01/03/21 1135 97 %     Weight 01/03/21 1133 257 lb 15 oz (117 kg)     Height 01/03/21 1133 5\' 4"  (1.626 m)     Head Circumference --      Peak Flow --      Pain Score 01/03/21 1132 8     Pain Loc --       Pain Edu? --      Excl. in GC? --     Constitutional: Alert and oriented. Well appearing and in no acute distress. Eyes: Conjunctivae are normal. PERRL. EOMI. Head: Atraumatic. Nose: No congestion/rhinnorhea. Mouth/Throat: Mucous membranes are moist.  Edematous tonsils.  No visible exudate.  Oropharynx non-erythematous. Neck: No stridor.   Hematological/Lymphatic/Immunilogical: No cervical lymphadenopathy. Cardiovascular: Normal rate, regular rhythm. Grossly normal heart sounds.  Good peripheral circulation. Respiratory: Normal respiratory effort.  No retractions. Lungs CTAB. Gastrointestinal: Soft and nontender. No distention. No abdominal bruits. No CVA tenderness. Genitourinary: Deferred Musculoskeletal: No lower extremity tenderness nor edema.  No joint effusions. Neurologic:  Normal speech and language. No gross focal neurologic deficits are appreciated. No gait instability. Skin:  Skin is warm, dry and intact. No rash noted. Psychiatric: Mood and affect are normal. Speech and behavior are normal.  ____________________________________________   LABS (all labs ordered are listed, but only abnormal results are displayed)  Labs Reviewed  BASIC METABOLIC PANEL - Abnormal; Notable for the following components:      Result Value   Glucose, Bld 127 (*)    All other components within normal limits  CBC WITH DIFFERENTIAL/PLATELET - Abnormal; Notable for the following components:   WBC 12.7 (*)    Platelets 457 (*)    Neutro Abs 11.1 (*)    Abs Immature Granulocytes 0.10 (*)    All other components within normal limits  URINALYSIS, COMPLETE (UACMP) WITH MICROSCOPIC - Abnormal; Notable for the following components:   Color, Urine YELLOW (*)    APPearance CLOUDY (*)    Specific Gravity, Urine 1.034 (*)    Hgb urine dipstick MODERATE (*)    Protein, ur 30 (*)    Leukocytes,Ua TRACE (*)    All other components within normal limits  GROUP A STREP BY PCR  SARS CORONAVIRUS 2 (TAT  6-24 HRS)  URINE CULTURE   ____________________________________________  EKG   ____________________________________________  RADIOLOGY I, Joni Reining, personally viewed and evaluated these images (plain radiographs) as part of my medical decision making, as well as reviewing the written report by the radiologist.  ED MD interpretation:    Official radiology report(s): DG Neck Soft Tissue  Result Date: 01/03/2021 CLINICAL DATA:  Difficulty swallowing for several days. EXAM: NECK SOFT TISSUES - 1+ VIEW COMPARISON:  None. FINDINGS: There is no evidence of retropharyngeal soft tissue swelling or epiglottic enlargement. The cervical airway is unremarkable and no radio-opaque foreign body identified. IMPRESSION: Negative. Electronically Signed   By: Amie Portland M.D.   On: 01/03/2021 14:07    ____________________________________________   PROCEDURES  Procedure(s) performed (including Critical Care):  Procedures   ____________________________________________   INITIAL IMPRESSION / ASSESSMENT AND PLAN / ED COURSE  As part of my medical decision making, I reviewed the following data within the electronic MEDICAL RECORD NUMBER         Patient presents with cough and loss of taste.  Patient suspects she has contacted COVID-19 even though she has been vaccinated.  Patient previous test was negative.  Patient also complained of difficulty swallowing which she states he states has increased.  Patient rapid strep test was negative and soft tissue neck x-ray was unremarkable.  Patient advised COVID-19 test results are pending.  Patient complaint and physical exam is consistent with a viral illness.  Due to patient decreased food and fluid intake she was rehydrated with 1000 cc normal saline, given Toradol 30 mg IV for myalgia, and Zofran 4 mg IV for nausea.  Patient advised continue previous medications.  Patient given a prescription for viscous lidocaine for swish and swallow.  Patient given  a work note and advised to follow-up with PCP.     ____________________________________________   FINAL CLINICAL IMPRESSION(S) / ED DIAGNOSES  Final diagnoses:  Viral URI with cough     ED Discharge Orders          Ordered    chlorpheniramine-HYDROcodone (TUSSIONEX PENNKINETIC ER) 10-8 MG/5ML SUER  2 times daily        01/03/21 1539    lidocaine (XYLOCAINE) 2 % solution  Every 6 hours PRN        01/03/21 1539  Note:  This document was prepared using Dragon voice recognition software and may include unintentional dictation errors.    Joni Reining, PA-C 01/03/21 1604    Concha Se, MD 01/04/21 [phone]

## 2021-01-03 NOTE — ED Triage Notes (Signed)
Pt comes into the ED via POV c/o cough and loss of taste.  PT has been seen here and was COVID negative.  Pt states she is feeling worse and none of the medications that have been prescribed are working.  PT ambulatory to triage at this time with even and unlabored respirations.

## 2021-01-03 NOTE — ED Notes (Addendum)
RN discussed throat discomfort with pt. Pt reports that the head of her stretcher is elevated to a similar level at which she sleeps at home due to "feeling like her throat is closing." RN inquired about breathing difficulty and asked if her provider had ever mentioned a sleep study or sleep apnea. Pt states that she knows she snores and believes she may have sleep apnea but has never been formally diagnosed with the condition. She also notes significant history regarding tonsillitis, including multiple hospitalizations for same, and frequent bouts of strep throat.

## 2021-01-04 ENCOUNTER — Ambulatory Visit: Payer: Self-pay

## 2021-01-04 NOTE — Telephone Encounter (Signed)
Patient called in have some questions surrounding Covid please call her at Ph#  [phone]   Pt. Reports she has been diagnosed with COVID 19. Concerned because she "has been prescribed 10 medications and I don't know if I should take all of this medicine." Instructed to contact her PC. Verbalizes understanding.

## 2021-01-05 LAB — URINE CULTURE: Culture: <10000 — AB

## 2021-01-06 ENCOUNTER — Inpatient Hospital Stay
Admission: EM | Admit: 2021-01-06 | Discharge: 2021-01-12 | DRG: 178 | Disposition: A | Discharge status: Home Health | Payer: BC Managed Care – PPO | Attending: Internal Medicine | Admitting: Internal Medicine

## 2021-01-06 ENCOUNTER — Encounter: Payer: Self-pay | Admitting: Emergency Medicine

## 2021-01-06 DIAGNOSIS — H6693 Otitis media, unspecified, bilateral: Secondary | ICD-10-CM | POA: Diagnosis present

## 2021-01-06 DIAGNOSIS — Z6841 Body Mass Index (BMI) 40.0 and over, adult: Secondary | ICD-10-CM

## 2021-01-06 DIAGNOSIS — U071 COVID-19: Principal | ICD-10-CM | POA: Diagnosis present

## 2021-01-06 DIAGNOSIS — M797 Fibromyalgia: Secondary | ICD-10-CM | POA: Diagnosis present

## 2021-01-06 DIAGNOSIS — R519 Headache, unspecified: Secondary | ICD-10-CM

## 2021-01-06 DIAGNOSIS — R531 Weakness: Secondary | ICD-10-CM | POA: Diagnosis not present

## 2021-01-06 DIAGNOSIS — Z79899 Other long term (current) drug therapy: Secondary | ICD-10-CM

## 2021-01-06 DIAGNOSIS — D72828 Other elevated white blood cell count: Secondary | ICD-10-CM | POA: Diagnosis present

## 2021-01-06 DIAGNOSIS — J4520 Mild intermittent asthma, uncomplicated: Secondary | ICD-10-CM | POA: Diagnosis present

## 2021-01-06 DIAGNOSIS — G43909 Migraine, unspecified, not intractable, without status migrainosus: Secondary | ICD-10-CM | POA: Diagnosis present

## 2021-01-06 DIAGNOSIS — I10 Essential (primary) hypertension: Secondary | ICD-10-CM

## 2021-01-06 DIAGNOSIS — R059 Cough, unspecified: Secondary | ICD-10-CM | POA: Diagnosis present

## 2021-01-06 DIAGNOSIS — Z833 Family history of diabetes mellitus: Secondary | ICD-10-CM

## 2021-01-06 DIAGNOSIS — Z8249 Family history of ischemic heart disease and other diseases of the circulatory system: Secondary | ICD-10-CM

## 2021-01-06 DIAGNOSIS — D72829 Elevated white blood cell count, unspecified: Secondary | ICD-10-CM | POA: Diagnosis present

## 2021-01-06 DIAGNOSIS — K219 Gastro-esophageal reflux disease without esophagitis: Secondary | ICD-10-CM | POA: Diagnosis present

## 2021-01-06 NOTE — ED Provider Notes (Deleted)
Emergency Medicine Provider Triage Evaluation Note  Toni Robertson , a 35 y.o. female  was evaluated in triage.  Pt complains of headache/migraine for several days. Recently had covid. Reports associated cough, denies chest pain or shortness of breath. Reports associated nausea without any abdominal pain.  Review of Systems  Positive: Cough, recent covid, headache, nausea Negative: Abdominal pain, chest pain, shortness of breath  Physical Exam  LMP 12/31/2020 (Exact Date)  Gen:   Awake, no distress   Resp:  Normal effort  MSK:   Moves extremities without difficulty  Other:    Medical Decision Making  Medically screening exam initiated at 8:08 PM.  Appropriate orders placed.  Toni Robertson was informed that the remainder of the evaluation will be completed by another provider, this initial triage assessment does not replace that evaluation, and the importance of remaining in the ED until their evaluation is complete.     Lucy Chris, [place] 01/06/21 2009

## 2021-01-06 NOTE — ED Notes (Signed)
Pt very upset, states "I don't feel like I am being treated like I should". Pt states " I have covid and i'm not being taken seriously, I don't feel good". Pt allowed to verbalize concerns regarding wait and frustration. Pt ensured that all treatment rooms are utilized at this time and she must wait in the lobby until a treatment room is available. Pt offered recliner, but then upon checking informed however at this time all recliners are taken. Pt verbalizes understanding and thanks this RN for listening.

## 2021-01-06 NOTE — ED Triage Notes (Signed)
Pt arrived via EMS from home with c/o HA x3 days with no relief of Ibuprofen taken today at 1300. Pt diagnosed with COVID 19 on Friday 6/24. Pt denies SOB.

## 2021-01-07 ENCOUNTER — Emergency Department: Payer: BC Managed Care – PPO

## 2021-01-07 ENCOUNTER — Encounter: Payer: Self-pay | Admitting: Internal Medicine

## 2021-01-07 ENCOUNTER — Inpatient Hospital Stay: Payer: BC Managed Care – PPO

## 2021-01-07 DIAGNOSIS — K219 Gastro-esophageal reflux disease without esophagitis: Secondary | ICD-10-CM | POA: Diagnosis present

## 2021-01-07 DIAGNOSIS — R519 Headache, unspecified: Secondary | ICD-10-CM | POA: Diagnosis not present

## 2021-01-07 DIAGNOSIS — R531 Weakness: Secondary | ICD-10-CM

## 2021-01-07 DIAGNOSIS — U071 COVID-19: Secondary | ICD-10-CM | POA: Diagnosis present

## 2021-01-07 DIAGNOSIS — D72828 Other elevated white blood cell count: Secondary | ICD-10-CM | POA: Diagnosis present

## 2021-01-07 DIAGNOSIS — H65193 Other acute nonsuppurative otitis media, bilateral: Secondary | ICD-10-CM

## 2021-01-07 DIAGNOSIS — H6693 Otitis media, unspecified, bilateral: Secondary | ICD-10-CM | POA: Diagnosis present

## 2021-01-07 DIAGNOSIS — Z833 Family history of diabetes mellitus: Secondary | ICD-10-CM | POA: Diagnosis not present

## 2021-01-07 DIAGNOSIS — Z79899 Other long term (current) drug therapy: Secondary | ICD-10-CM | POA: Diagnosis not present

## 2021-01-07 DIAGNOSIS — M797 Fibromyalgia: Secondary | ICD-10-CM | POA: Diagnosis present

## 2021-01-07 DIAGNOSIS — H6690 Otitis media, unspecified, unspecified ear: Secondary | ICD-10-CM | POA: Insufficient documentation

## 2021-01-07 DIAGNOSIS — R059 Cough, unspecified: Secondary | ICD-10-CM | POA: Diagnosis present

## 2021-01-07 DIAGNOSIS — J4520 Mild intermittent asthma, uncomplicated: Secondary | ICD-10-CM

## 2021-01-07 DIAGNOSIS — I10 Essential (primary) hypertension: Secondary | ICD-10-CM | POA: Diagnosis present

## 2021-01-07 DIAGNOSIS — Z6841 Body Mass Index (BMI) 40.0 and over, adult: Secondary | ICD-10-CM | POA: Diagnosis not present

## 2021-01-07 DIAGNOSIS — Z8249 Family history of ischemic heart disease and other diseases of the circulatory system: Secondary | ICD-10-CM | POA: Diagnosis not present

## 2021-01-07 DIAGNOSIS — D72829 Elevated white blood cell count, unspecified: Secondary | ICD-10-CM | POA: Diagnosis present

## 2021-01-07 DIAGNOSIS — G43909 Migraine, unspecified, not intractable, without status migrainosus: Secondary | ICD-10-CM | POA: Diagnosis present

## 2021-01-07 DIAGNOSIS — R109 Unspecified abdominal pain: Secondary | ICD-10-CM

## 2021-01-07 LAB — COMPREHENSIVE METABOLIC PANEL
ALT: 15 U/L (ref 0–44)
ALT: 13 U/L (ref 0–44)
AST: 15 U/L (ref 15–41)
AST: 13 U/L — ABNORMAL LOW (ref 15–41)
Albumin: 3.9 g/dL (ref 3.5–5.0)
Albumin: 3.4 g/dL — ABNORMAL LOW (ref 3.5–5.0)
Alkaline Phosphatase: 88 U/L (ref 38–126)
Alkaline Phosphatase: 76 U/L (ref 38–126)
Anion gap: 8 (ref 5–15)
Anion gap: 8 (ref 5–15)
BUN: 14 mg/dL (ref 6–20)
BUN: 13 mg/dL (ref 6–20)
CO2: 25 mmol/L (ref 22–32)
CO2: 26 mmol/L (ref 22–32)
Calcium: 9.2 mg/dL (ref 8.9–10.3)
Calcium: 8.3 mg/dL — ABNORMAL LOW (ref 8.9–10.3)
Chloride: 104 mmol/L (ref 98–111)
Chloride: 105 mmol/L (ref 98–111)
Creatinine, Ser: 0.9 mg/dL (ref 0.44–1.00)
Creatinine, Ser: 0.88 mg/dL (ref 0.44–1.00)
GFR, Estimated: >60 mL/min (ref >60)
GFR, Estimated: >60 mL/min (ref >60)
Glucose, Bld: 117 mg/dL — ABNORMAL HIGH (ref 70–99)
Glucose, Bld: 110 mg/dL — ABNORMAL HIGH (ref 70–99)
Potassium: 3.6 mmol/L (ref 3.5–5.1)
Potassium: 3.7 mmol/L (ref 3.5–5.1)
Sodium: 137 mmol/L (ref 135–145)
Sodium: 139 mmol/L (ref 135–145)
Total Bilirubin: 0.5 mg/dL (ref 0.3–1.2)
Total Bilirubin: 0.4 mg/dL (ref 0.3–1.2)
Total Protein: 7.9 g/dL (ref 6.5–8.1)
Total Protein: 6.7 g/dL (ref 6.5–8.1)

## 2021-01-07 LAB — CBC WITH DIFFERENTIAL/PLATELET
Abs Immature Granulocytes: 0.06 10*3/uL (ref 0.00–0.07)
Abs Immature Granulocytes: 0.03 10*3/uL (ref 0.00–0.07)
Basophils Absolute: 0 10*3/uL (ref 0.0–0.1)
Basophils Absolute: 0 10*3/uL (ref 0.0–0.1)
Basophils Relative: 0 %
Basophils Relative: 0 %
Eosinophils Absolute: 0.2 10*3/uL (ref 0.0–0.5)
Eosinophils Absolute: 0.1 10*3/uL (ref 0.0–0.5)
Eosinophils Relative: 1 %
Eosinophils Relative: 1 %
HCT: 42.8 % (ref 36.0–46.0)
HCT: 39.4 % (ref 36.0–46.0)
Hemoglobin: 14 g/dL (ref 12.0–15.0)
Hemoglobin: 12.8 g/dL (ref 12.0–15.0)
Immature Granulocytes: 0 %
Immature Granulocytes: 0 %
Lymphocytes Relative: 28 %
Lymphocytes Relative: 29 %
Lymphs Abs: 4.5 10*3/uL — ABNORMAL HIGH (ref 0.7–4.0)
Lymphs Abs: 4.3 10*3/uL — ABNORMAL HIGH (ref 0.7–4.0)
MCH: 27.1 pg (ref 26.0–34.0)
MCH: 27.5 pg (ref 26.0–34.0)
MCHC: 32.7 g/dL (ref 30.0–36.0)
MCHC: 32.5 g/dL (ref 30.0–36.0)
MCV: 82.9 fL (ref 80.0–100.0)
MCV: 84.5 fL (ref 80.0–100.0)
Monocytes Absolute: 0.6 10*3/uL (ref 0.1–1.0)
Monocytes Absolute: 0.6 10*3/uL (ref 0.1–1.0)
Monocytes Relative: 4 %
Monocytes Relative: 4 %
Neutro Abs: 10.7 10*3/uL — ABNORMAL HIGH (ref 1.7–7.7)
Neutro Abs: 9.7 10*3/uL — ABNORMAL HIGH (ref 1.7–7.7)
Neutrophils Relative %: 67 %
Neutrophils Relative %: 66 %
Platelets: 422 10*3/uL — ABNORMAL HIGH (ref 150–400)
Platelets: 361 10*3/uL (ref 150–400)
RBC: 5.16 MIL/uL — ABNORMAL HIGH (ref 3.87–5.11)
RBC: 4.66 MIL/uL (ref 3.87–5.11)
RDW: 13.9 % (ref 11.5–15.5)
RDW: 14 % (ref 11.5–15.5)
WBC: 16 10*3/uL — ABNORMAL HIGH (ref 4.0–10.5)
WBC: 14.8 10*3/uL — ABNORMAL HIGH (ref 4.0–10.5)
nRBC: 0 % (ref 0.0–0.2)
nRBC: 0 % (ref 0.0–0.2)

## 2021-01-07 LAB — BLOOD GAS, VENOUS
Acid-Base Excess: 1 mmol/L (ref 0.0–2.0)
Bicarbonate: 27.9 mmol/L (ref 20.0–28.0)
O2 Saturation: 49.6 %
Patient temperature: 37
pCO2, Ven: 53 mmHg (ref 44.0–60.0)
pH, Ven: 7.33 (ref 7.250–7.430)
pO2, Ven: <31 mmHg — CL (ref 32.0–45.0)

## 2021-01-07 LAB — TSH: TSH: 1.406 u[IU]/mL (ref 0.350–4.500)

## 2021-01-07 LAB — LACTATE DEHYDROGENASE
LDH: 127 U/L (ref 98–192)
LDH: 113 U/L (ref 98–192)

## 2021-01-07 LAB — URINALYSIS, COMPLETE (UACMP) WITH MICROSCOPIC
Bilirubin Urine: NEGATIVE
Glucose, UA: NEGATIVE mg/dL
Ketones, ur: NEGATIVE mg/dL
Leukocytes,Ua: NEGATIVE
Nitrite: NEGATIVE
Protein, ur: NEGATIVE mg/dL
Specific Gravity, Urine: 1.029 (ref 1.005–1.030)
pH: 5 (ref 5.0–8.0)

## 2021-01-07 LAB — FIBRINOGEN
Fibrinogen: 494 mg/dL — ABNORMAL HIGH (ref 210–475)
Fibrinogen: 477 mg/dL — ABNORMAL HIGH (ref 210–475)

## 2021-01-07 LAB — FERRITIN
Ferritin: 119 ng/mL (ref 11–307)
Ferritin: 108 ng/mL (ref 11–307)

## 2021-01-07 LAB — TYPE AND SCREEN
ABO/RH(D): O POS
Antibody Screen: NEGATIVE

## 2021-01-07 LAB — TROPONIN I (HIGH SENSITIVITY)
Troponin I (High Sensitivity): 4 ng/L (ref <18)
Troponin I (High Sensitivity): 3 ng/L (ref <18)

## 2021-01-07 LAB — D-DIMER, QUANTITATIVE
D-Dimer, Quant: <0.27 ug/mL-FEU (ref 0.00–0.50)
D-Dimer, Quant: <0.27 ug/mL-FEU (ref 0.00–0.50)

## 2021-01-07 LAB — HIV ANTIBODY (ROUTINE TESTING W REFLEX): HIV Screen 4th Generation wRfx: NONREACTIVE

## 2021-01-07 LAB — C-REACTIVE PROTEIN
CRP: 1.6 mg/dL — ABNORMAL HIGH (ref <1.0)
CRP: 1.1 mg/dL — ABNORMAL HIGH (ref <1.0)

## 2021-01-07 LAB — BRAIN NATRIURETIC PEPTIDE: B Natriuretic Peptide: 8.7 pg/mL (ref 0.0–100.0)

## 2021-01-07 LAB — MAGNESIUM: Magnesium: 2 mg/dL (ref 1.7–2.4)

## 2021-01-07 LAB — HEPATITIS B SURFACE ANTIGEN: Hepatitis B Surface Ag: NONREACTIVE

## 2021-01-07 LAB — PROCALCITONIN: Procalcitonin: <0.1 ng/mL

## 2021-01-07 LAB — PHOSPHORUS: Phosphorus: 3.3 mg/dL (ref 2.5–4.6)

## 2021-01-07 IMAGING — CT CT HEAD W/O CM
4 series · 16 of 47 positions shown, 18 images · non-contrast
Comparison: [DATE]

CLINICAL DATA: Headache

EXAM:
CT HEAD WITHOUT CONTRAST
TECHNIQUE: Contiguous axial images were obtained from the base of the skull
through the vertex without intravenous contrast.

[Series 2: head wo · axial · 0.42mm/px · z∈[-130,-10]mm · 7 of 34 slices shown, 9 images]
[im 5/34  brain]
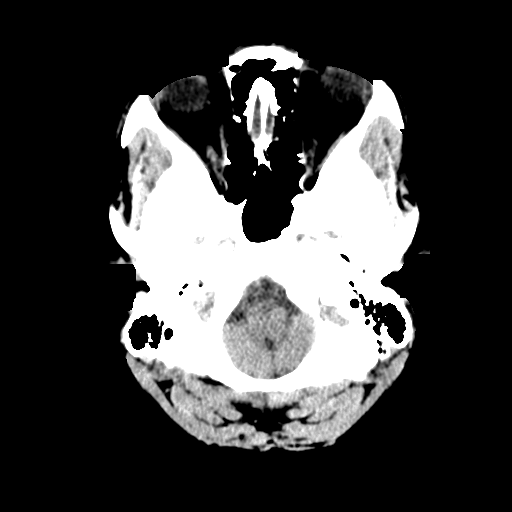
[im 5/34  bone]
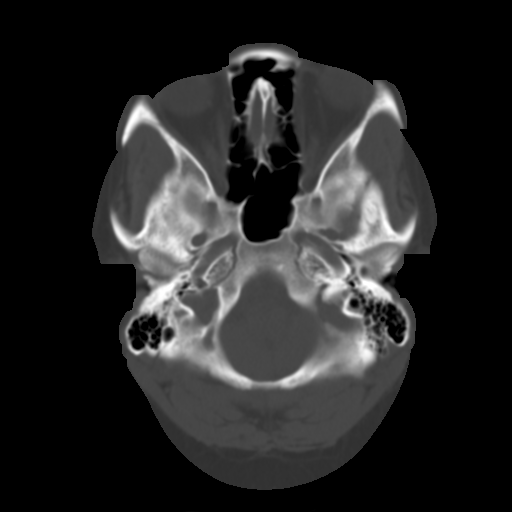
[im 9/34  brain]
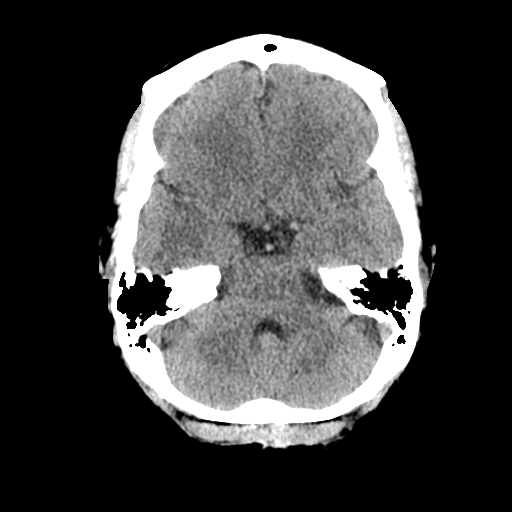
[im 13/34  brain]
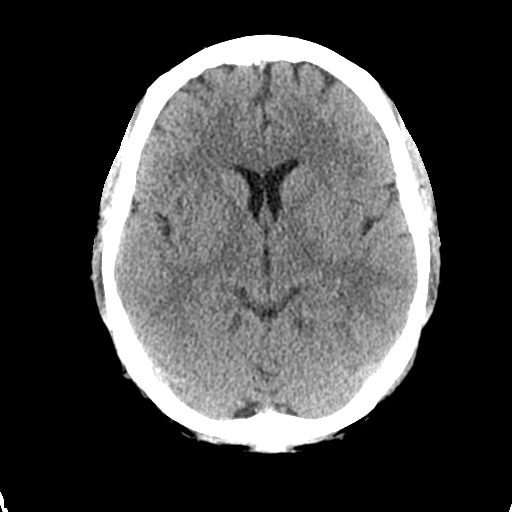
[im 17/34  brain]
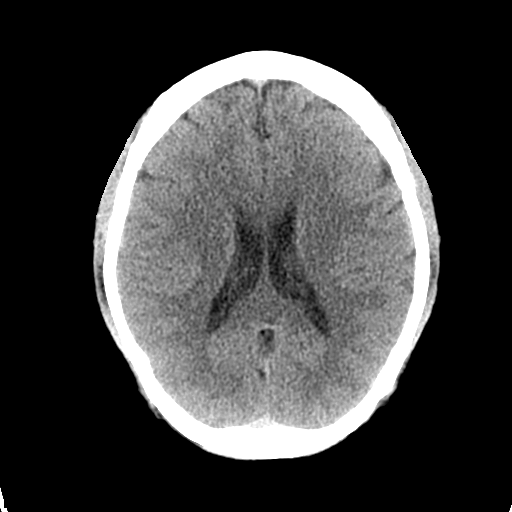
[im 21/34  brain]
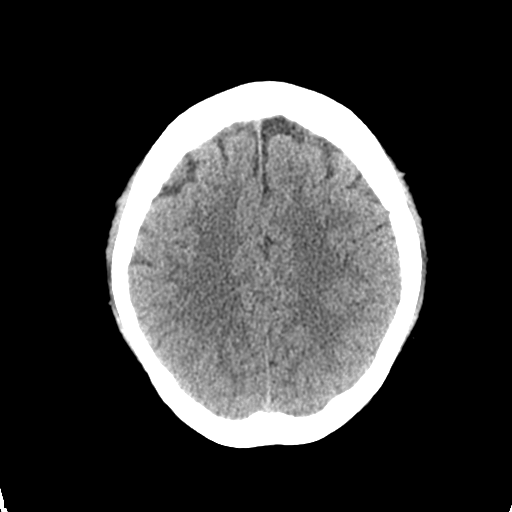
[im 21/34  bone]
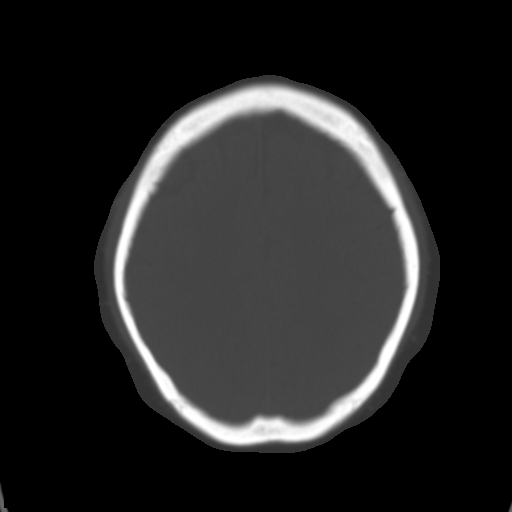
[im 25/34  brain]
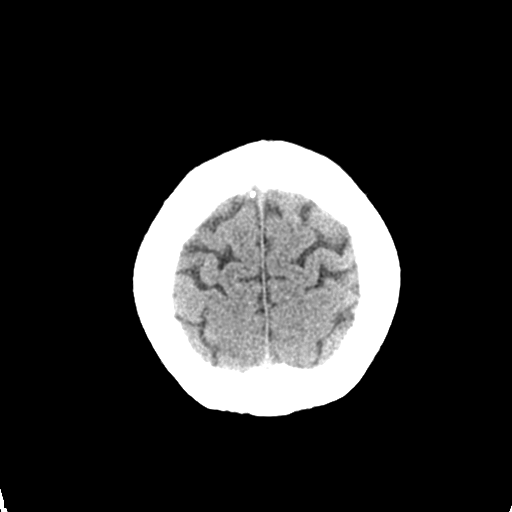
[im 29/34  brain]
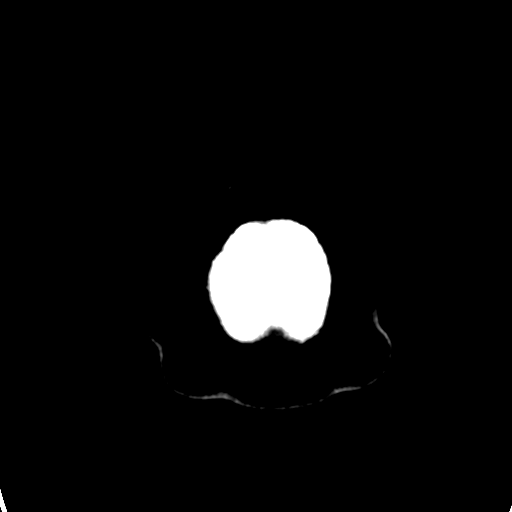

[Series 3: head bone · axial · 0.42mm/px · z∈[-134,-102]mm · 3 of 84 slices shown]
[im 9/84  bone]
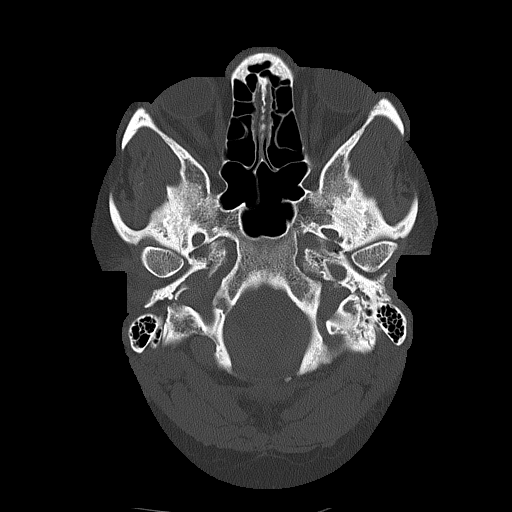
[im 17/84  bone]
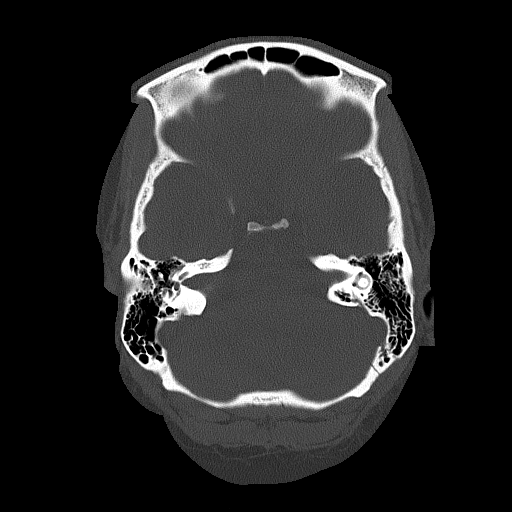
[im 25/84  bone]
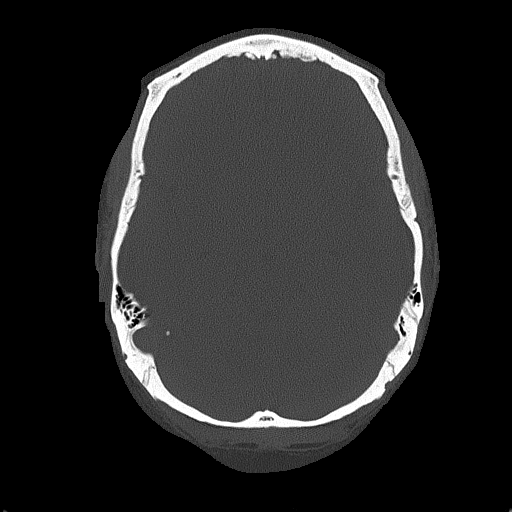

[Series 4: coronal soft tissue · coronal · 0.34mm/px · 3 of 70 slices shown]
[im 24/70  brain]
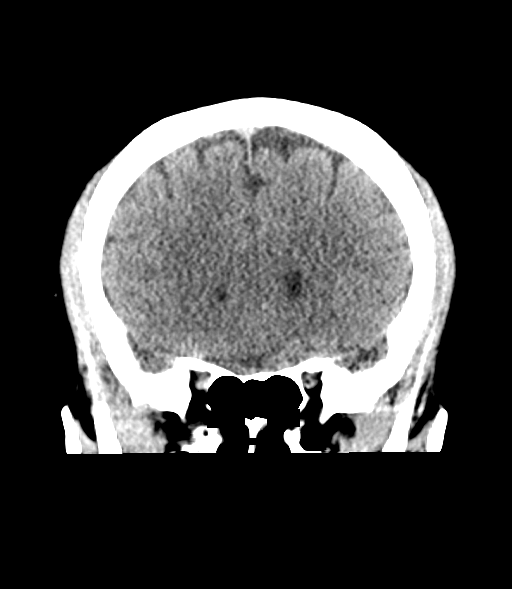
[im 31/70  brain]
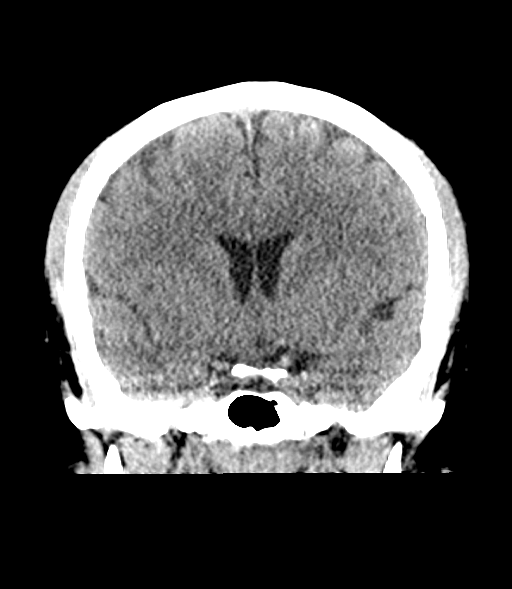
[im 39/70  brain]
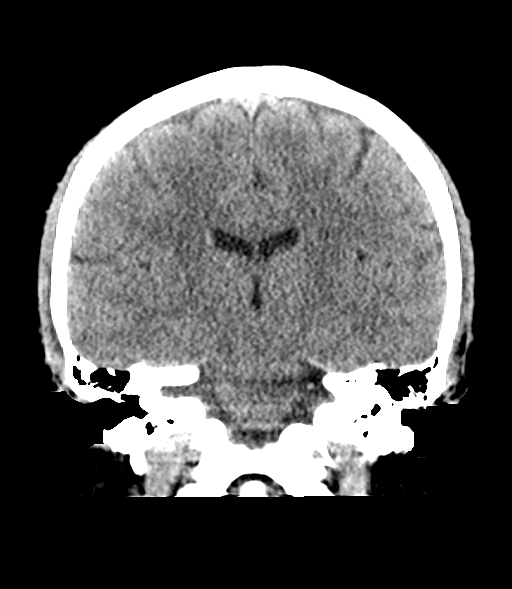

[Series 5: sagittal soft tissue · sagittal · 0.33mm/px · 3 of 57 slices shown]
[im 19/57  brain]
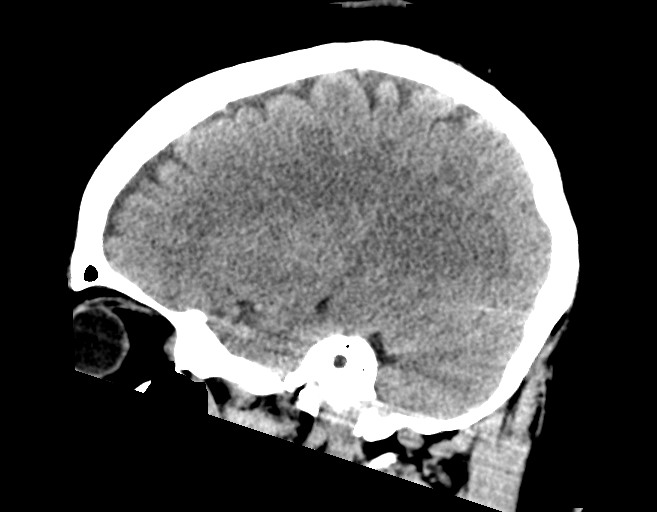
[im 29/57  brain]
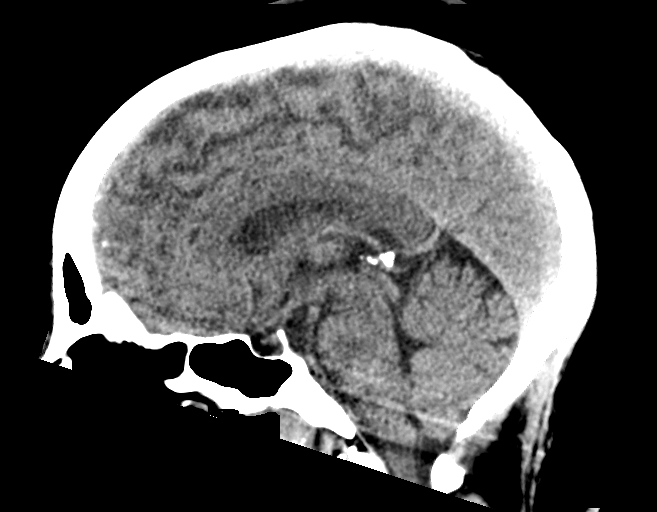
[im 38/57  brain]
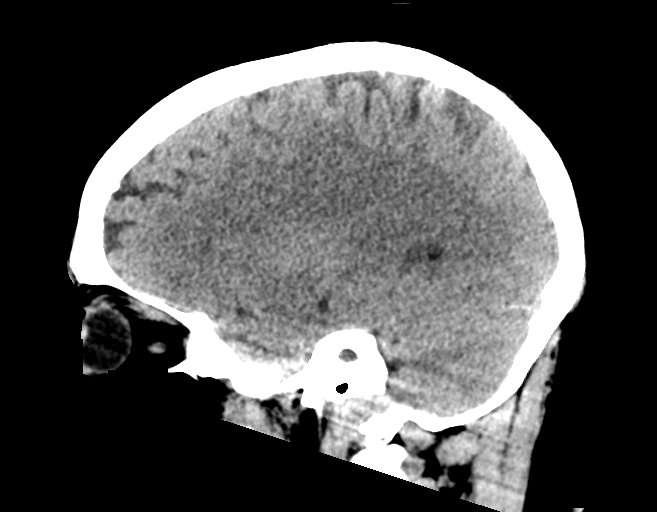

[16 of 47 positions shown; findings below may reference images not displayed]

FINDINGS: Brain: No acute intracranial abnormality. Specifically, no
hemorrhage, hydrocephalus, mass lesion, acute infarction, or
significant intracranial injury.

Vascular: No hyperdense vessel or unexpected calcification.

Skull: No acute calvarial abnormality.

Sinuses/Orbits: No acute findings

Other: None
IMPRESSION: No acute intracranial abnormality.

## 2021-01-07 IMAGING — CT CT RENAL STONE PROTOCOL
2 of 4 series · 15 of 46 positions shown, 17 images · non-contrast
Comparison: CT [DATE]

CLINICAL DATA: Left flank pain, COVID positive, history of
urolithiasis

EXAM:
CT ABDOMEN AND PELVIS WITHOUT CONTRAST
TECHNIQUE: Multidetector CT imaging of the abdomen and pelvis was performed
following the standard protocol without IV contrast.

[Series 2: stone full standard · axial · 0.72mm/px · z∈[-954,-494]mm · 12 of 102 slices shown, 14 images]
[im 5/102  soft-tissue]
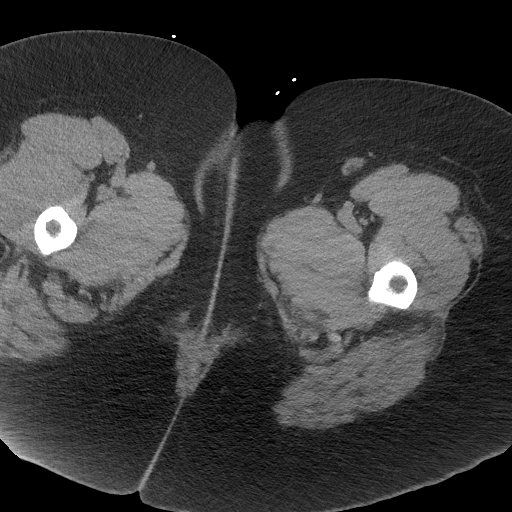
[im 5/102  bone]
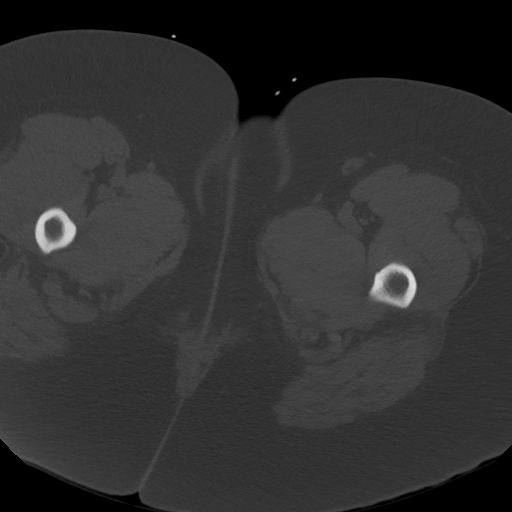
[im 14/102  soft-tissue]
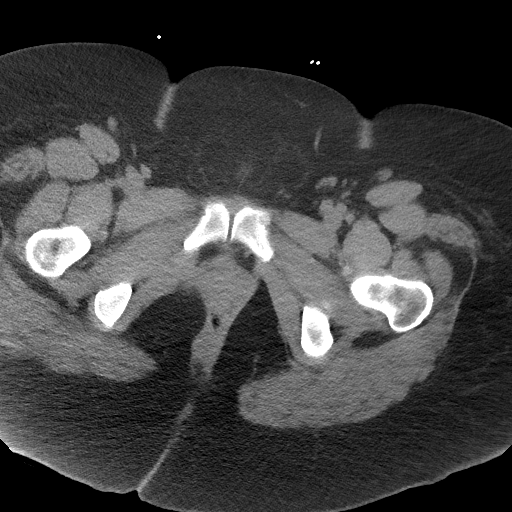
[im 22/102  soft-tissue]
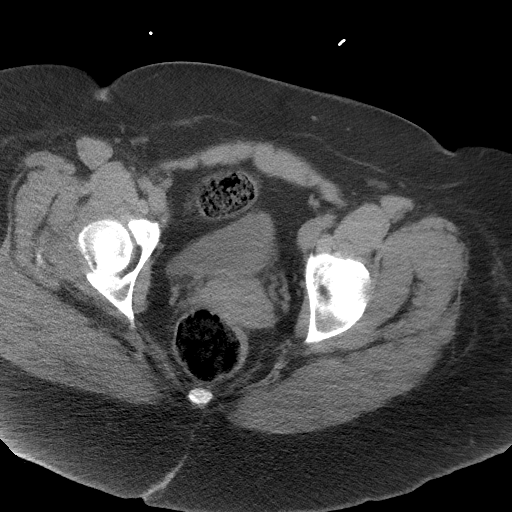
[im 31/102  soft-tissue]
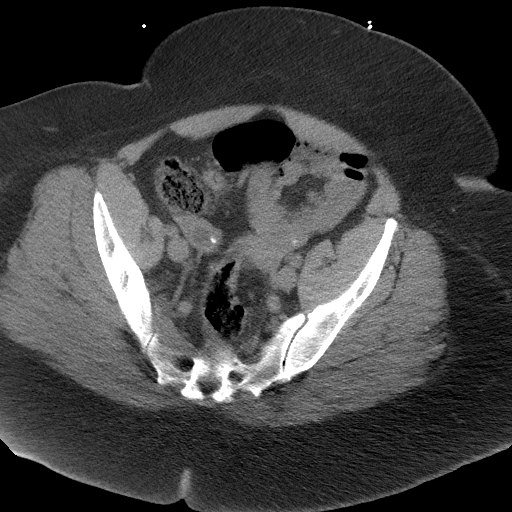
[im 40/102  soft-tissue]
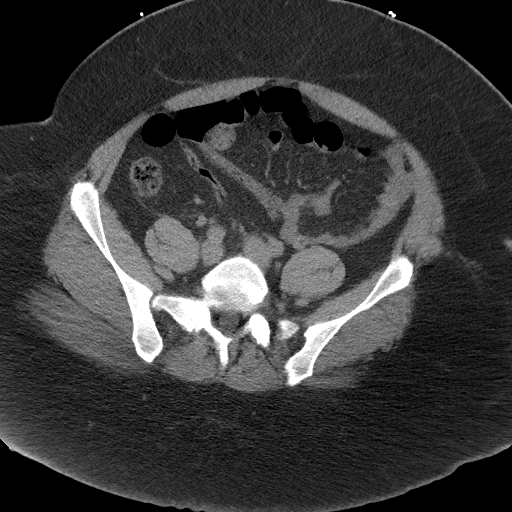
[im 49/102  soft-tissue]
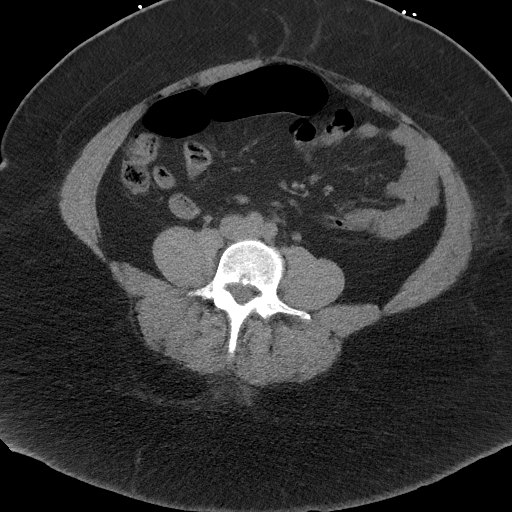
[im 53/102  soft-tissue]
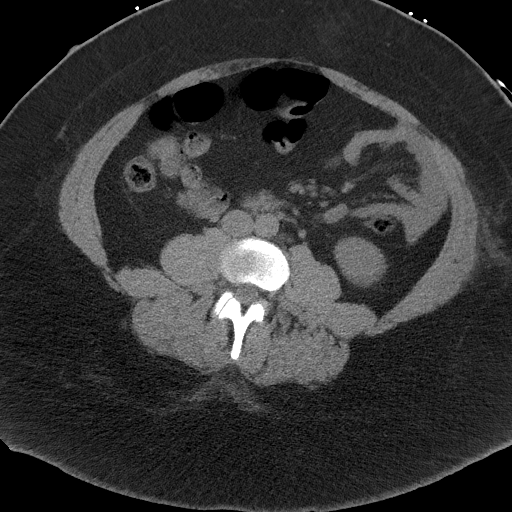
[im 62/102  soft-tissue]
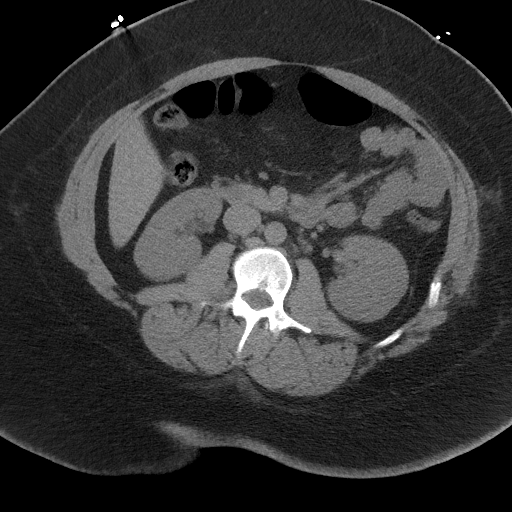
[im 71/102  soft-tissue]
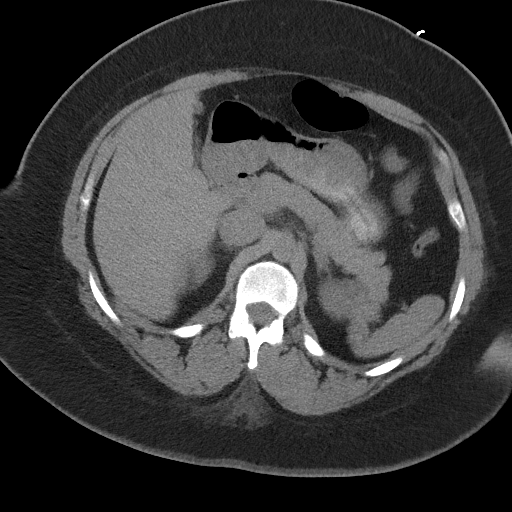
[im 71/102  bone]
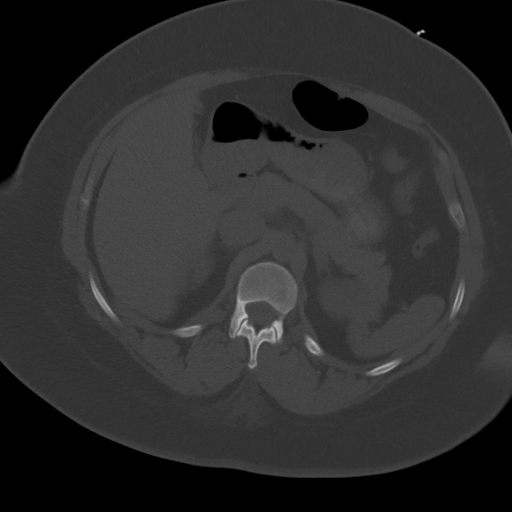
[im 80/102  soft-tissue]
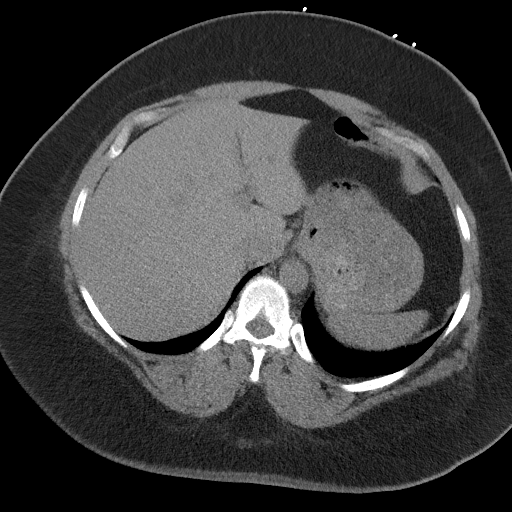
[im 88/102  soft-tissue]
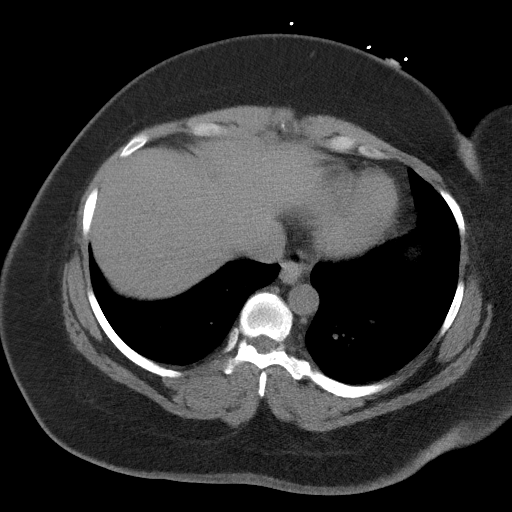
[im 97/102  soft-tissue]
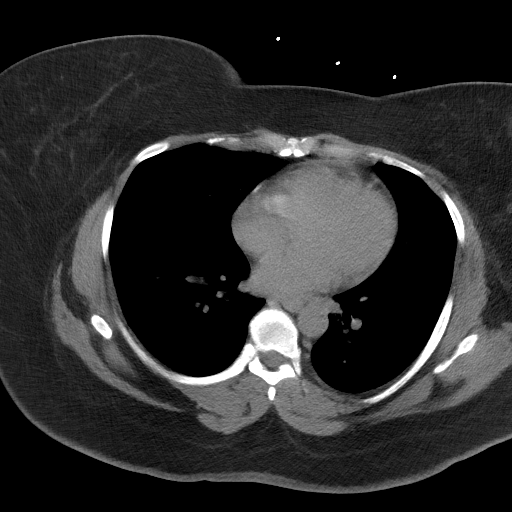

[Series 5: coronal · coronal · 0.78mm/px · 3 of 167 slices shown]
[im 56/167  soft-tissue]
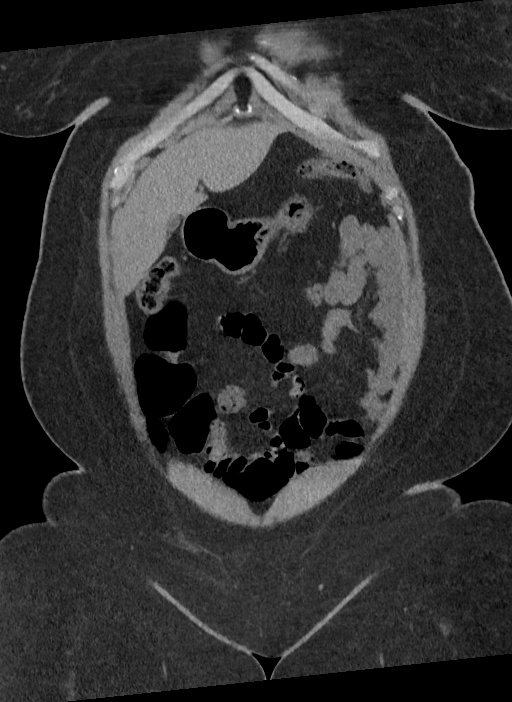
[im 74/167  soft-tissue]
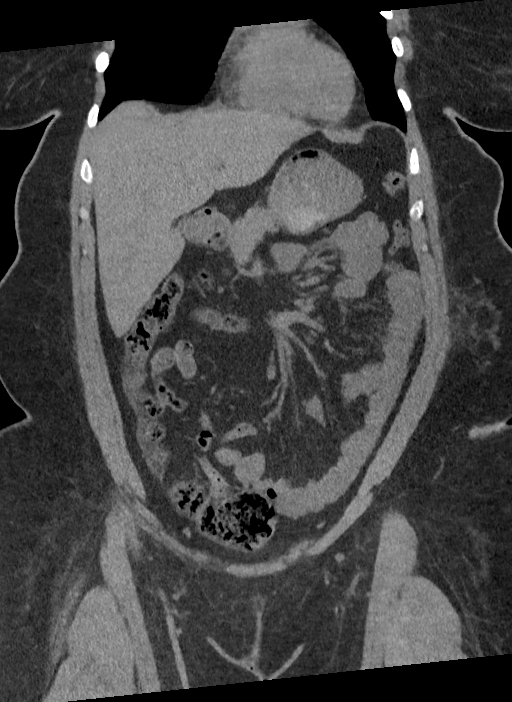
[im 93/167  soft-tissue]
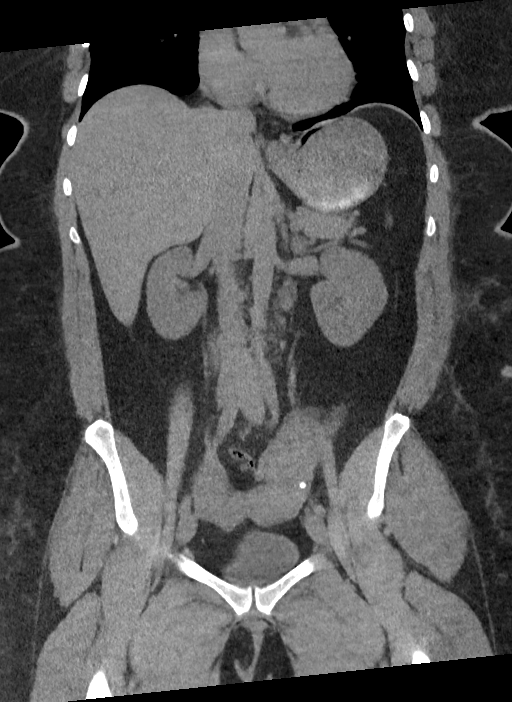

[15 of 46 positions shown; findings below may reference images not displayed]

FINDINGS: Lower chest: Lung bases are clear. Normal heart size. No pericardial
effusion.

Hepatobiliary: No worrisome focal liver lesions. Smooth liver
surface contour. Normal hepatic attenuation.

Pancreas: No pancreatic ductal dilatation or surrounding
inflammatory changes.

Spleen: Normal in size. No concerning splenic lesions. No concerning
adrenal nodules or masses.

Adrenals/Urinary Tract: Kidneys are symmetric in size and normally
located. No visible or contour deforming renal lesions. No
urolithiasis or hydronephrosis. Urinary bladder is largely
decompressed at the time of exam and therefore poorly evaluated by
CT imaging. No gross bladder abnormality accounting for
underdistention.

Stomach/Bowel: Sliding-type hiatal hernia. Stomach and duodenum are
otherwise. Appendix is not visualized. No focal inflammation the
vicinity of the cecum to suggest an occult appendicitis. No colonic
dilatation or wall thickening. No evidence of bowel obstruction.

Vascular/Lymphatic: No significant vascular findings are present. No
enlarged abdominal or pelvic lymph nodes.

Reproductive: Bilateral ligation clips. Uterus and bilateral adnexa
are unremarkable.

Other: No abdominopelvic free fluid or free gas. No bowel containing
hernias. Small fat containing umbilical hernia.

Musculoskeletal: No acute or significant osseous findings.
IMPRESSION: No acute urinary tract abnormality, specifically no visible
urolithiasis or hydronephrosis.

No other acute CT abnormality to provide cause for patient's
discomfort in the left flank.

Small sliding-type hiatal hernia.

## 2021-01-07 IMAGING — DX DG CHEST 1V PORT
1 series · 1 of 1 positions shown · non-contrast
Comparison: [DATE]

CLINICAL DATA: COVID, shortness of breath

EXAM:
PORTABLE CHEST 1 VIEW

[chest ap]
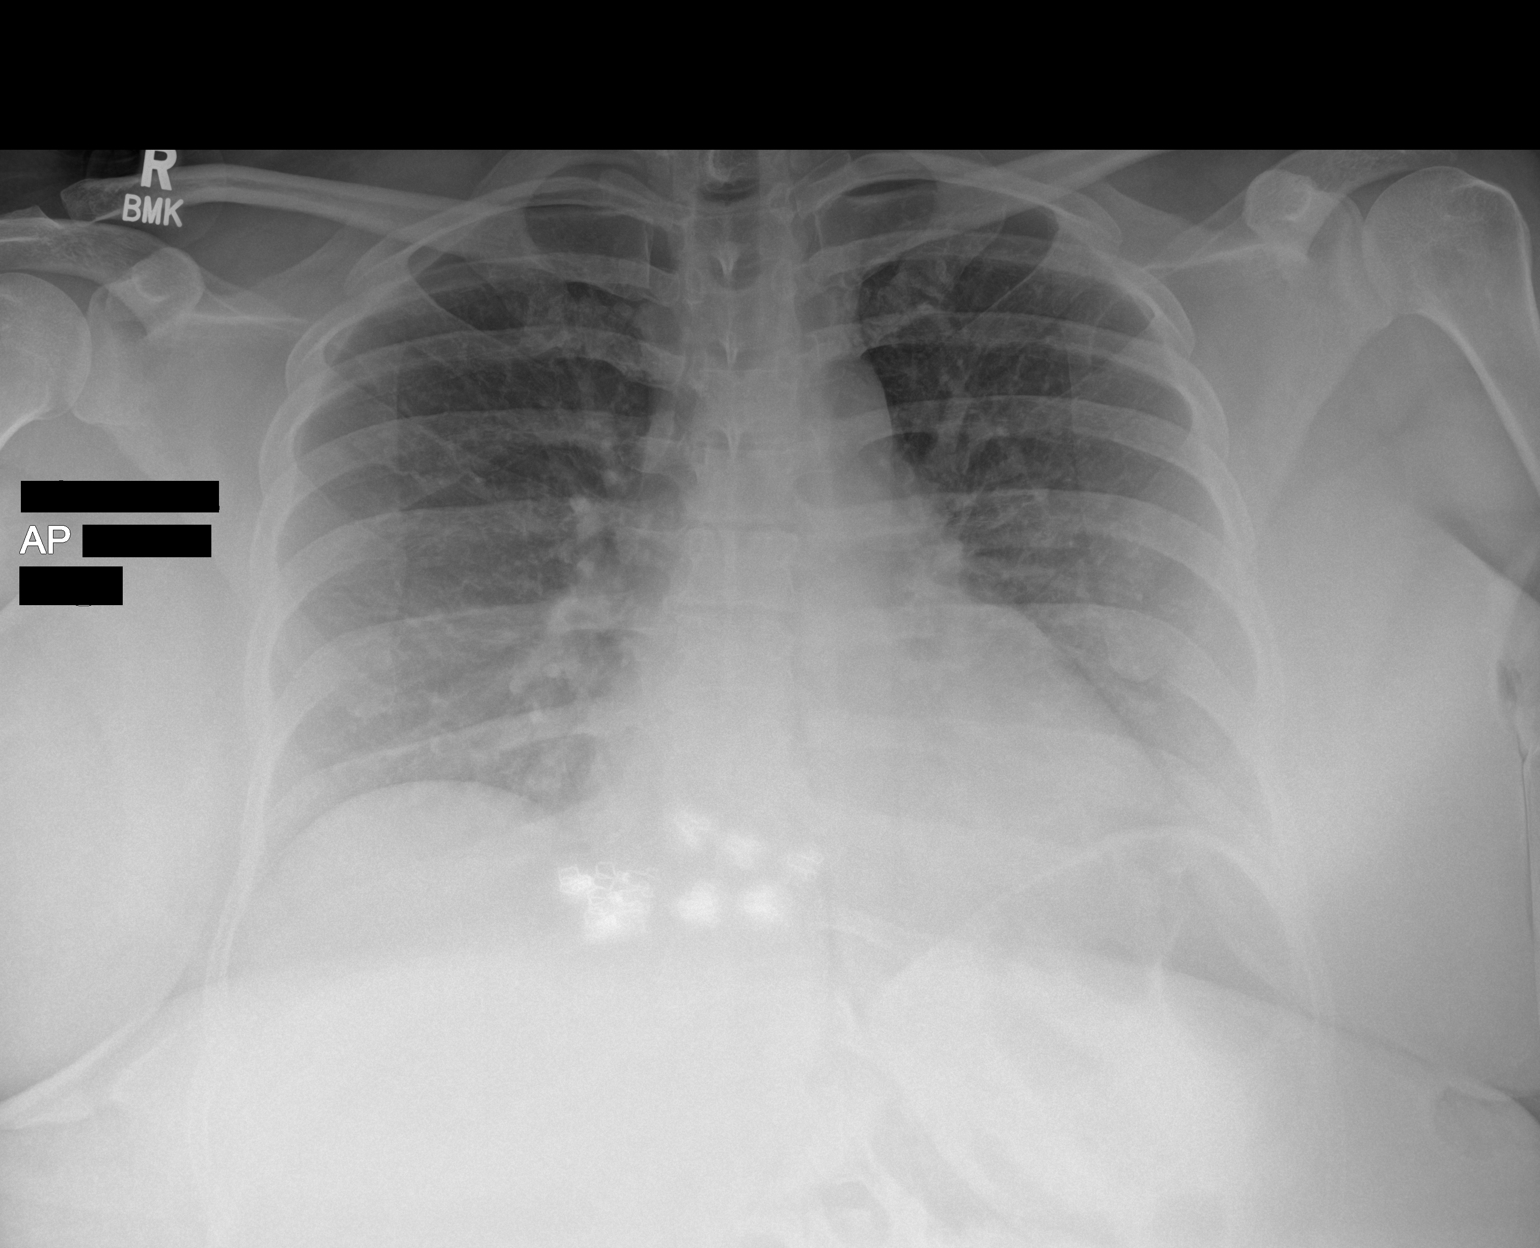

[1 of 1 positions shown; findings below may reference images not displayed]

FINDINGS: Heart and mediastinal contours are within normal limits. No focal
opacities or effusions. No acute bony abnormality.
IMPRESSION: No active disease.

## 2021-01-07 MED ORDER — BUPROPION HCL ER (XL) 150 MG PO TB24: 300.0000 mg | ORAL_TABLET | Freq: Every day | ORAL | Status: DC

## 2021-01-07 MED ORDER — SODIUM CHLORIDE 0.9 % IV SOLN
1.0000 mg/kg | Freq: Two times a day (BID) | INTRAVENOUS | Status: DC
  Administered 2021-01-07 – 2021-01-08 (×3): 130 mg via INTRAVENOUS
  Filled 2021-01-07 (×7): qty 1.04

## 2021-01-07 MED ORDER — SUMATRIPTAN SUCCINATE 50 MG PO TABS
50.0000 mg | ORAL_TABLET | Freq: Every day | ORAL | Status: DC | PRN
  Administered 2021-01-08 – 2021-01-12 (×4): 50 mg via ORAL
  Filled 2021-01-07 (×8): qty 1

## 2021-01-07 MED ORDER — IPRATROPIUM-ALBUTEROL 20-100 MCG/ACT IN AERS
1.0000 | INHALATION_SPRAY | Freq: Four times a day (QID) | RESPIRATORY_TRACT | Status: DC
  Administered 2021-01-07 – 2021-01-12 (×19): 1 via RESPIRATORY_TRACT
  Filled 2021-01-07: qty 4

## 2021-01-07 MED ORDER — MONTELUKAST SODIUM 10 MG PO TABS: 10.0000 mg | ORAL_TABLET | Freq: Every day | ORAL | Status: DC

## 2021-01-07 MED ORDER — BENZONATATE 100 MG PO CAPS
200.0000 mg | ORAL_CAPSULE | Freq: Three times a day (TID) | ORAL | Status: DC | PRN
  Administered 2021-01-08: 06:00:00 200 mg via ORAL
  Filled 2021-01-07: qty 2

## 2021-01-07 MED ORDER — ALBUTEROL SULFATE (2.5 MG/3ML) 0.083% IN NEBU: 2.5000 mg | INHALATION_SOLUTION | RESPIRATORY_TRACT | Status: DC | PRN

## 2021-01-07 MED ORDER — ACETAMINOPHEN 650 MG RE SUPP: 650.0000 mg | Freq: Four times a day (QID) | RECTAL | Status: DC | PRN

## 2021-01-07 MED ORDER — KETOROLAC TROMETHAMINE 30 MG/ML IJ SOLN
15.0000 mg | Freq: Once | INTRAMUSCULAR | Status: AC
Start: 2021-01-07 — End: 2021-01-07
  Administered 2021-01-07: 15 mg via INTRAVENOUS
  Filled 2021-01-07: qty 1

## 2021-01-07 MED ORDER — ACETAMINOPHEN 325 MG PO TABS
650.0000 mg | ORAL_TABLET | Freq: Four times a day (QID) | ORAL | Status: DC | PRN
  Administered 2021-01-07 – 2021-01-12 (×10): 650 mg via ORAL
  Filled 2021-01-07 (×12): qty 2

## 2021-01-07 MED ORDER — BUTALBITAL-APAP-CAFFEINE 50-325-40 MG PO TABS
1.0000 | ORAL_TABLET | Freq: Four times a day (QID) | ORAL | Status: DC | PRN
  Administered 2021-01-07 – 2021-01-11 (×10): 1 via ORAL
  Filled 2021-01-07 (×12): qty 1

## 2021-01-07 MED ORDER — ONDANSETRON HCL 4 MG/2ML IJ SOLN
4.0000 mg | Freq: Once | INTRAMUSCULAR | Status: AC
  Administered 2021-01-07: 4 mg via INTRAVENOUS
  Filled 2021-01-07: qty 2

## 2021-01-07 MED ORDER — SODIUM CHLORIDE 0.9 % IV BOLUS
1000.0000 mL | Freq: Once | INTRAVENOUS | Status: AC
Start: 2021-01-07 — End: 2021-01-07
  Administered 2021-01-07: 1000 mL via INTRAVENOUS

## 2021-01-07 MED ORDER — SODIUM CHLORIDE 0.9 % IV SOLN
100.0000 mg | Freq: Every day | INTRAVENOUS | Status: DC
  Administered 2021-01-08 – 2021-01-09 (×2): 100 mg via INTRAVENOUS
  Filled 2021-01-07 (×2): qty 100

## 2021-01-07 MED ORDER — GABAPENTIN 300 MG PO CAPS: 300.0000 mg | ORAL_CAPSULE | Freq: Three times a day (TID) | ORAL | Status: DC

## 2021-01-07 MED ORDER — SODIUM CHLORIDE 0.9 % IV SOLN
200.0000 mg | Freq: Once | INTRAVENOUS | Status: AC
  Administered 2021-01-07: 200 mg via INTRAVENOUS
  Filled 2021-01-07: qty 40

## 2021-01-07 MED ORDER — PREDNISONE 50 MG PO TABS: 50.0000 mg | ORAL_TABLET | Freq: Every day | ORAL | Status: DC

## 2021-01-07 MED ORDER — FAMOTIDINE 20 MG PO TABS
20.0000 mg | ORAL_TABLET | Freq: Two times a day (BID) | ORAL | Status: DC
  Administered 2021-01-07 – 2021-01-12 (×11): 20 mg via ORAL
  Filled 2021-01-07 (×11): qty 1

## 2021-01-07 MED ORDER — MAGNESIUM SULFATE 2 GM/50ML IV SOLN
2.0000 g | Freq: Once | INTRAVENOUS | Status: AC
  Administered 2021-01-07: 2 g via INTRAVENOUS
  Filled 2021-01-07: qty 50

## 2021-01-07 MED ORDER — ONDANSETRON HCL 4 MG/2ML IJ SOLN
4.0000 mg | Freq: Four times a day (QID) | INTRAMUSCULAR | Status: DC | PRN
  Administered 2021-01-10 – 2021-01-11 (×4): 4 mg via INTRAVENOUS
  Filled 2021-01-07 (×5): qty 2

## 2021-01-07 MED ORDER — AMOXICILLIN-POT CLAVULANATE 875-125 MG PO TABS
1.0000 | ORAL_TABLET | Freq: Two times a day (BID) | ORAL | Status: DC
  Administered 2021-01-07 – 2021-01-08 (×2): 1 via ORAL
  Filled 2021-01-07 (×2): qty 1

## 2021-01-07 NOTE — ED Notes (Signed)
Informed RN bed assigned 

## 2021-01-07 NOTE — H&P (Addendum)
History and Physical    PLEASE NOTE THAT DRAGON DICTATION SOFTWARE WAS USED IN THE CONSTRUCTION OF THIS NOTE.   Toni Robertson IWP:[id number] DOB: 1986/02/17 DOA: 01/06/2021  PCP: [facility] Patient coming from: home   I have personally briefly reviewed patient's old medical records in [place]  Chief Complaint: Generalized weakness  HPI: Toni Robertson is a 35 y.o. female with medical history significant for GERD, mild intermittent asthma, who is admitted to [facility] on 01/06/2021 with COVID-19 infection after presenting from home to [facility] ED complaining of generalized weakness.   The patient reports 1 week of progressive generalized weakness in the absence of any acute focal weakness, acute focal numbness, paresthesias, dysarthria, facial droop, dysphagia, acute change in vision, or vertigo.  She reports that this has been associated with 5 to 6 days of worsening rhinitis, rhinorrhea, new onset nonproductive cough.  She also notes associated intermittent nausea in the absence of any vomiting.  Denies any overt associated shortness of breath, orthopnea, PND, or new onset peripheral edema.  Not associate with any recent chest pain, diaphoresis, palpitations.  No recent mopped assist, new lower extremity erythema, or calf tenderness.  No recent trauma, travel, surgical procedures, or periods of prolonged diminished ambulatory status no recent melena or hematochezia.  She also notes associated subjective fever and generalized myalgias over the last 5 to 6 days as well as intermittent headache in the absence of aura.  Not associate with any neck stiffness, abdominal pain, or diarrhea.  No recent traveling or known COVID-19 exposures.  She denies any recent dysuria, gross hematuria, or change in urinary urgency/frequency.  She conveys that she is a lifelong non-smoker, but acknowledges a history of mild intermittent asthma for which she is on daily Singulair  as an outpatient.  Denies any baseline supplemental oxygen requirements.  While she denies any recent shortness of breath associate with the above symptoms, she reports mild wheezing over the last 5 to 6 days, which is new for her.  In the setting of the above constellation of progressive symptoms, the patient is reportedly sought medical attention on 5 different occasions starting on 01/01/2021, including outpatient visits, virtual visits, and ED visits.  In the setting of the above presentation, she was diagnosed with a viral upper respiratory infection on 01/01/2021 with recommendations for supportive management.  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  In setting of further progression of her symptoms, she presented to the emergency department on 01/03/2021, which time she was prescribed Augmentin and prednisone course and was discharged to home.  The next day, in the context of further progression of the above symptoms, she had a televisit, at which time provider stopped her Augmentin and prednisone.  Following these measures, she noted further worsening of the above symptoms, prompting her to present back to [facility] ED this evening for further evaluation and management thereof.    At baseline, the patient reports that she lives independently, and is able to independently complete all of her own ADLs, and does not require any assistance with transfers or with ambulation.  However, in the setting of further worsening of the aforementioned general weakness, she has progressive difficulty in performing her ADLs independently and also notes interval requirement of  assistance with transfers, and finds it significantly more difficult to ambulate, purely on the basis of her  generalized weakness.  Of note, she was started on Paxlovid 2 days ago, and in spite of reported good interval compliance with this medication, reports progression of the above constellation of symptoms, prompting her to present back to [facility] ED this evening, as above.     ED Course:  Vital signs in the ED were notable for the following: Tetramex 99.2, heart rate initially noted to be 102, with subsequent decrease to 89 oxygen saturation 132/79 -140/91; respiratory rate 20, oxygen saturation 98 to 100% on room air.  Labs were notable for the following: CMP was notable for the following: Sodium 137, bicarbonate 25, creatinine 0.9, glucose 117, and liver enzymes were found to be within normal limits.  Procalcitonin less than 0.10, LDH 127, ferritin 119, CRP 1.6, D-dimer less than 0.27, fibrinogen 494.  High-sensitivity troponin IV x1 was found to be 4.  CBC notable for blood cell count of 16,000, hemoglobin 14.  Urinalysis has been ordered, with result currently pending.  EKG shows sinus rhythm with heart rate 90, normal intervals, no evidence of T wave changes, and nonspecific less than 1 mm ST elevation limited to lead V2, in the absence of any evidence of ST depression.  In the context of recent intermittent headaches, CT head was pursued, and showed no evidence of acute intracranial process, including no evidence of intracranial hemorrhage or acute ischemic infarct.  Chest x-ray showed no evidence of acute cardiopulmonary process, including no evidence of infiltrate, edema, effusion, or pneumothorax.  While in the ED, the following were administered: Zofran 4 mg IV x1, Solu-Medrol x1 dose, remdesivir x1 dose, and normal saline x1 L bolus.    Review of Systems: As per HPI otherwise 10 point review of systems negative.   Past Medical History:  Diagnosis Date   Family history of adverse reaction to anesthesia    MATERNAL GRANDFATHER-HARD TO WAKE UP   Fibromyalgia    Headache    MIGRAINE    History of kidney stones 06/2017   History of methicillin resistant staphylococcus aureus (MRSA) 2013    Past Surgical History:  Procedure Laterality Date   IUD REMOVAL N/A 07/17/2017   Procedure: INTRAUTERINE DEVICE (IUD) REMOVAL;  Surgeon: Boykin Nearing, MD;  Location: [facility] ORS;  Service: Gynecology;  Laterality: N/A;   LAPAROSCOPIC TUBAL LIGATION Bilateral 07/17/2017   Procedure: LAPAROSCOPIC TUBAL LIGATION;  Surgeon: Schermerhorn, Gwen Her, MD;  Location: [facility] ORS;  Service: Gynecology;  Laterality: Bilateral;    Social History:  reports that she has never smoked. She has never used smokeless tobacco. She reports current alcohol use. She reports that she does not use drugs.   Allergies  Allergen Reactions   Coconut Flavor Anaphylaxis   Azithromycin Itching    Family History  Problem Relation Age of Onset   Hypertension Mother    Diabetes Father     Family history reviewed and not pertinent    Prior to Admission medications   Medication Sig Start Date End Date Taking? Authorizing Provider  albuterol (PROVENTIL) (2.5 MG/3ML) 0.083% nebulizer solution Take 3 mLs (2.5 mg total) by nebulization every 6 (six) hours as needed for wheezing or shortness of breath. 01/02/21   Paulette Blanch, MD  amoxicillin-clavulanate (AUGMENTIN ES-600) 600-42.9 MG/5ML suspension Take 5 mLs (600 mg total) by mouth every 12 (twelve) hours for 7 days. 01/02/21 01/09/21  Paulette Blanch, MD  benzonatate (TESSALON) 200 MG  capsule Take 1 capsule (200 mg total) by mouth 3 (three) times daily as needed for cough. 01/01/21   Melynda Ripple, MD  buPROPion (WELLBUTRIN XL) 300 MG 24 hr tablet Take by mouth. 01/28/18   [provider]  chlorpheniramine-HYDROcodone (TUSSIONEX PENNKINETIC ER) 10-8 MG/5ML SUER Take 5 mLs by mouth every 12 (twelve) hours as needed for cough. 01/01/21   Melynda Ripple, MD  chlorpheniramine-HYDROcodone [facility] PENNKINETIC ER) 10-8 MG/5ML SUER Take 5 mLs by mouth 2 (two)  times daily. 01/03/21   Sable Feil, PA-C  DULoxetine (CYMBALTA) 20 MG capsule Take 20 mg by mouth daily.    [provider]  famotidine (PEPCID) 20 MG tablet Take 1 tablet (20 mg total) by mouth 2 (two) times daily. 01/01/21   Melynda Ripple, MD  gabapentin (NEURONTIN) 300 MG capsule Take 300 mg by mouth 3 (three) times daily.    [provider]  ibuprofen (ADVIL) 600 MG tablet Take 1 tablet (600 mg total) by mouth every 6 (six) hours as needed. 01/01/21   Melynda Ripple, MD  levonorgestrel (MIRENA) 20 MCG/24HR IUD 1 each by Intrauterine route once.    [provider]  lidocaine (XYLOCAINE) 2 % solution Use as directed 5 mLs in the mouth or throat every 6 (six) hours as needed for mouth pain. For swish and swallow. 01/03/21   Sable Feil, PA-C  magic mouthwash SOLN Take 5 mLs by mouth 3 (three) times daily as needed for mouth pain. 01/02/21   Paulette Blanch, MD  montelukast (SINGULAIR) 10 MG tablet Take 10 mg by mouth at bedtime.    [provider]  Nebulizers (COMPRESSOR/NEBULIZER) MISC 1 Units by Does not apply route every 4 (four) hours as needed. 01/02/21   Paulette Blanch, MD  predniSONE (DELTASONE) 20 MG tablet 3 tablets daily x 4 days 01/02/21   Paulette Blanch, MD     Objective    Physical Exam: Vitals:   01/06/21 2030 01/06/21 2037 01/07/21 0130 01/07/21 0200  BP: (!) 154/100  (!) 140/91 132/79  Pulse: (!) 102  98 89  Resp: _0 Temp: 99.2 F (37.3 C)     TempSrc: Oral     SpO2: 100%  98% 99%  Weight:  127 kg    Height:  5' 4" (1.626 m)      General: appears to be stated age; alert, oriented Skin: warm, dry,  Head:  AT/ Mouth:  Oral mucosa membranes appear moist, normal dentition Neck: supple; trachea midline Heart:  RRR; did not appreciate any M/R/G Lungs: Expiratory wheezes noted, otherwise CTAB, did not appreciate any rales, or rhonchi Abdomen: + BS; soft, ND, NT Vascular: 2+ pedal pulses b/l; 2+ radial pulses  b/l Extremities: no peripheral edema, no muscle wasting Neuro: strength and sensation intact in upper and lower extremities b/l    Labs on Admission: I have personally reviewed following labs and imaging studies  CBC: Recent Labs  Lab 01/03/21 1204 01/07/21 0054  WBC 12.7* 16.0*  NEUTROABS 11.1* 10.7*  HGB 13.6 14.0  HCT 40.9 42.8  MCV 82.5 82.9  PLT 457* 209*   Basic Metabolic Panel: Recent Labs  Lab 01/03/21 1204 01/07/21 0054  NA 138 137  K 3.7 3.6  CL 107 104  CO2 25 25  GLUCOSE 127* 117*  BUN 16 14  CREATININE 0.86 0.90  CALCIUM 9.1 9.2   GFR: Estimated Creatinine Clearance: 115.1 mL/min (by C-G formula based on SCr of 0.9 mg/dL). Liver  Function Tests: Recent Labs  Lab 01/07/21 0054  AST 15  ALT 15  ALKPHOS 88  BILITOT 0.5  PROT 7.9  ALBUMIN 3.9   No results for input(s): LIPASE, AMYLASE in the last 168 hours. No results for input(s): AMMONIA in the last 168 hours. Coagulation Profile: No results for input(s): INR, PROTIME in the last 168 hours. Cardiac Enzymes: No results for input(s): CKTOTAL, CKMB, CKMBINDEX, TROPONINI in the last 168 hours. BNP (last 3 results) No results for input(s): PROBNP in the last 8760 hours. HbA1C: No results for input(s): HGBA1C in the last 72 hours. CBG: No results for input(s): GLUCAP in the last 168 hours. Lipid Profile: No results for input(s): CHOL, HDL, LDLCALC, TRIG, CHOLHDL, LDLDIRECT in the last 72 hours. Thyroid Function Tests: No results for input(s): TSH, T4TOTAL, FREET4, T3FREE, THYROIDAB in the last 72 hours. Anemia Panel: Recent Labs    01/07/21 0106  FERRITIN 119   Urine analysis:    Component Value Date/Time   COLORURINE YELLOW (A) 01/03/2021 1503   APPEARANCEUR CLOUDY (A) 01/03/2021 1503   LABSPEC 1.034 (H) 01/03/2021 1503   PHURINE 5.0 01/03/2021 1503   GLUCOSEU NEGATIVE 01/03/2021 1503   HGBUR MODERATE (A) 01/03/2021 1503   BILIRUBINUR NEGATIVE 01/03/2021 1503   KETONESUR NEGATIVE  01/03/2021 1503   PROTEINUR 30 (A) 01/03/2021 1503   NITRITE NEGATIVE 01/03/2021 1503   LEUKOCYTESUR TRACE (A) 01/03/2021 1503    Radiological Exams on Admission: CT Head Wo Contrast  Result Date: 01/07/2021 CLINICAL DATA:  Headache EXAM: CT HEAD WITHOUT CONTRAST TECHNIQUE: Contiguous axial images were obtained from the base of the skull through the vertex without intravenous contrast. COMPARISON:  09/13/2019 FINDINGS: Brain: No acute intracranial abnormality. Specifically, no hemorrhage, hydrocephalus, mass lesion, acute infarction, or significant intracranial injury. Vascular: No hyperdense vessel or unexpected calcification. Skull: No acute calvarial abnormality. Sinuses/Orbits: No acute findings Other: None IMPRESSION: No acute intracranial abnormality. Electronically Signed   By: Rolm Baptise M.D.   On: 01/07/2021 01:17   DG Chest Port 1 View  Result Date: 01/07/2021 CLINICAL DATA:  COVID, shortness of breath EXAM: PORTABLE CHEST 1 VIEW COMPARISON:  01/02/2021 FINDINGS: Heart and mediastinal contours are within normal limits. No focal opacities or effusions. No acute bony abnormality. IMPRESSION: No active disease. Electronically Signed   By: Rolm Baptise M.D.   On: 01/07/2021 00:59     EKG: Independently reviewed, with result as described above.    Assessment/Plan   Toni Robertson is a 34 y.o. female with medical history significant for GERD, mild intermittent asthma, who is admitted to [facility] on 01/06/2021 with COVID-19 infection after presenting from home to [facility] ED complaining of generalized weakness.   Principal Problem:   COVID-19 virus infection Active Problems:   Generalized weakness   Cough   Leukocytosis   Frontal headache   GERD (gastroesophageal reflux disease)   Mild intermittent asthma      #) COVID-19 infection: diagnosis on the basis of: 5 to 6 days of progressive new onset rhinitis, rhinorrhea, nonproductive cough, headache, nausea,  subjective fever, generalized myalgias, with confirmed COVID-19 positive test on 01/03/2021. Of note, presentation does not appear to be associated with acute hypoxic respiratory distress/failure, with patient maintaining O2 sats greater than 94% on room air. Overall, it does not appear that criteria are met at the present time for patient's COVID-19 infection to be considered severe in nature. Consequently, there does not appear to be an indication at this time for initiation of  dexamethasone per treatment guidance recommendations from [place]'s Covid Treatment Guidelines, when taking into account exclusively the patient's COVID-19 infection.  However, in the setting of mild expiratory wheezes in the context of a history of mild intermittent asthma, there appears to be an indication for continuation of systemic corticosteroids on the basis of this bronchospastic indication.  In this setting, EDP started a 3-day course of Solu-Medrol followed by 2-day course of prednisone, which will be continued for now, per the above rationale.  Of note, in the setting of BMI > 30, this patient meets criteria to be considered high risk for a more complicated clinical course of COVID-19 infection, including increased probability for progression of the severity associated with this infection. Therefore, in the setting of symptomatic COVID-19 infection requiring hospitalization for further evaluation and management thereof in this patient with the aforementioned high risk criteria who is felt to be early in the course of their infection given onset of respiratory symptoms starting less than 7 days ago, indications are met for initiation of remdesivir per treatment guidance recommendations from [place]'s Covid Treatment Guidelines. Of note, ALT found to be less than 220. Therefore, there is no contraindication for initiation of remdesivir on the basis of transaminitis.  Denies any known recent COVID-19 exposures.  Is a  lifelong non-smoker, and denies any known history of underlying diabetes.  Presenting chest x-ray shows no evidence of acute cardiopulmonary process.  Additionally, nonelevated presenting procalcitonin provides further evidence of the absence of underlying actual pneumonia, given this nonelevated finding and spite of the pro inflammatory state that is associated with acute COVID-19 infections.  Additionally, general laboratory markers show mild initial elevation, as further quantified above, plan to further trending studies.  Of note, initial mild tachycardia has resolved, leaving mild leukocytosis is the sole SIRS criteria at this time.  Therefore, criteria for sepsis are not met at the present juncture.    Plan: Airborne and contact precautions. Monitor continuous pulse oximetry and monitor on telemetry. prn supplemental O2 to maintain O2 sats greater than or equal to 94%. Proning protocol initiated. PRN albuterol inhaler. Scheduled combivent inhaler q6H. PRN acetaminophen for fever. Start remdesivir, as above.  Continue corticosteroids, per rationale described above.  Repeat general inflammatory markers in the morning.  Check serum magnesium level, particularly in setting of underlying asthma.  We will also check phosphorus level.  Repeat CMP and CBC in the morning.  Flutter valve incentive spirometry ordered.  Check VBG.  As needed Zofran.  As needed [employer]       #) Generalized weakness: 1 week of progressive generalized weakness in the absence of any acute focal weakness.  No evidence of acute focal neurologic deficits, thereby rendering the possibility of acute ischemic infarct be less likely.  Additionally, presenting CT head demonstrates no evidence of acute intracranial process, as further detailed above.  Presenting generalized weakness, which is rendering the patient with progressive difficulty performing her ADLs independently as well as being able to transfer independently, is  associated with a reported stark contrast from her baseline functionality and ambulatory status, as further detailed above.  This appears to be on the basis of physiologic stress stemming from presenting COVID-19 infection, as further detailed above.  No evidence of additional underlying infectious process at this time, although we will check urinalysis to further evaluate.  No evidence of concomitant bacterial pneumonia, as further detailed above.  Plan: Further evaluation management of acute COVID-19 infection, as above.  Check TSH and urinalysis.  Physical therapy consult placed for the morning.      #) Leukocytosis: CBC reflects presenting white blood cell count of 16,000.  While there may be a contribution to this from presenting COVID-19 infection, I suspect there is also pharmacologic influence from recent brief course of prednisone as an outpatient, as further detailed above.  Criteria for sepsis not met at this time, as further detailed per evaluations respiratory, as above.   Plan: Check urinalysis.  Repeat CBC with differential in the morning.  Further evaluation management of presenting COVID-19 infection, as above.       #) Frontal headache: The patient reports 4 to 5 days of intermittent symmetrical frontal headache in the absence of any aura.  Presentation less consistent with migraine.  Rather, her recent intermittent headaches appear more consistent with tension headaches in the context of recent diagnosis of COVID-19 infection, as above.  Not associate with any acute focal neurologic deficits, no evidence of acute ischemic CVA.  CT head this evening shows no evidence of acute intracranial process, as above.  Plan: As needed Tylenol.  Further evaluation management of presenting COVID-19 infection, as above.        #) GERD: On Pepcid as an outpatient.  Plan: Continue home Pepcid.       #) Mild intermittent asthma: Documented history of such.  On Singulair as an  outpatient.  While there is no overt evidence of associated acute asthma exacerbation at this time, including no report of recent shortness of breath, she does exhibit evidence of mild bilateral wheezing, and is at increased risk for ensuing development of asthma exacerbation in the context of her presenting COVID-19 infection.  Given this constellation of factors and physical exam findings, we will continue corticosteroids, as above.  Plan: Continue corticosteroids, as above.  Add on serum magnesium level.  Scheduled Combivent inhaler.  As needed albuterol inhaler.  Check VBG.  Further evaluation management of acute COVID-19 infection, as above.  Continue Singulair.  Monitor on continuous pulse oximetry.      DVT prophylaxis: scd's  Code Status: Full code Family Communication: none Disposition Plan: Per Rounding Team Consults called: none  Admission status: Inpatient; med telemetry     Of note, this patient was added by me to the following Admit List/Treatment Team: armcadmits.      PLEASE NOTE THAT DRAGON DICTATION SOFTWARE WAS USED IN THE CONSTRUCTION OF THIS NOTE.   [facility] Pager [phone] From 6PM - 6AM  Otherwise, please contact night-coverage  [email or web address] Password [facility]   01/07/2021, 2:47 AM

## 2021-01-07 NOTE — ED Notes (Signed)
Pt provided breakfast tray.

## 2021-01-07 NOTE — Progress Notes (Signed)
She needsPatient ID: Gardiner Ramus, female   DOB: 1986/02/20, 35 y.o.   MRN: [id number] [facility] PROGRESS NOTE  Bryanah Sidell JIR:[id number] DOB: 08/12/1985 DOA: 01/06/2021 PCP: [facility]  HPI/Subjective: Patient has had a recent COVID exposure at work.  Coming into the hospital with severe headache and now developed side pain.  She states that she is just feeling very weak.  Was dizzy.  Room was spinning.  Just not feeling well at all.  Admitted with COVID-19 infection and wheezing.  Also with headache.  Objective: Vitals:   01/07/21 1445 01/07/21 1500  BP:    Pulse: (!) 109 (!) 109  Resp: (!) 21 19  Temp:    SpO2: 99% 98%   No intake or output data in the 24 hours ending 01/07/21 1524 Filed Weights   01/06/21 2037  Weight: 127 kg    ROS: Review of Systems  Respiratory:  Positive for cough, shortness of breath and wheezing.   Cardiovascular:  Negative for chest pain.  Gastrointestinal:  Positive for abdominal pain. Negative for nausea and vomiting.  Exam: Physical Exam HENT:     Head: Normocephalic.     Ears:     Comments: Bilateral tympanic membrane erythema and bulging    Mouth/Throat:     Pharynx: No oropharyngeal exudate.  Eyes:     General: Lids are normal.     Conjunctiva/sclera: Conjunctivae normal.     Pupils: Pupils are equal, round, and reactive to light.  Cardiovascular:     Rate and Rhythm: Regular rhythm. Tachycardia present.     Heart sounds: Normal heart sounds, S1 normal and S2 normal.  Pulmonary:     Breath sounds: Examination of the right-lower field reveals decreased breath sounds and wheezing. Examination of the left-lower field reveals decreased breath sounds and wheezing. Decreased breath sounds and wheezing present. No rhonchi or rales.  Abdominal:     Palpations: Abdomen is soft.     Tenderness: There is right CVA tenderness and left CVA tenderness.     Comments: Bilateral flank pain.  Musculoskeletal:     Right ankle:  No swelling.     Left ankle: No swelling.  Skin:    General: Skin is warm.     Findings: No rash.  Neurological:     Mental Status: She is alert and oriented to person, place, and time.     Comments: Power 5 out of 5 all extremities.     Data Reviewed: Basic Metabolic Panel: Recent Labs  Lab 01/03/21 1204 01/07/21 0054 01/07/21 0430  NA 138 137 139  K 3.7 3.6 3.7  CL 107 104 105  CO2 25 25 26   GLUCOSE 127* 117* 110*  BUN 16 14 13   CREATININE 0.86 0.90 0.88  CALCIUM 9.1 9.2 8.3*  MG  --   --  2.0  PHOS  --   --  3.3   Liver Function Tests: Recent Labs  Lab 01/07/21 0054 01/07/21 0430  AST 15 13*  ALT 15 13  ALKPHOS 88 76  BILITOT 0.5 0.4  PROT 7.9 6.7  ALBUMIN 3.9 3.4*   CBC: Recent Labs  Lab 01/03/21 1204 01/07/21 0054 01/07/21 0430  WBC 12.7* 16.0* 14.8*  NEUTROABS 11.1* 10.7* 9.7*  HGB 13.6 14.0 12.8  HCT 40.9 42.8 39.4  MCV 82.5 82.9 84.5  PLT 457* 422* 361   BNP (last 3 results) Recent Labs    01/07/21 0054  BNP 8.7     Recent Results (from the  past 240 hour(s))  SARS CORONAVIRUS 2 (TAT 6-24 HRS) Nasopharyngeal Nasopharyngeal Swab     Status: Abnormal   Collection Time: 01/03/21 12:04 PM   Specimen: Nasopharyngeal Swab  Result Value Ref Range Status   SARS Coronavirus 2 POSITIVE (A) NEGATIVE Final    Comment: (NOTE) SARS-CoV-2 target nucleic acids are DETECTED.  The SARS-CoV-2 RNA is generally detectable in upper and lower respiratory specimens during the acute phase of infection. Positive results are indicative of the presence of SARS-CoV-2 RNA. Clinical correlation with patient history and other diagnostic information is  necessary to determine patient infection status. Positive results do not rule out bacterial infection or co-infection with other viruses.  The expected result is Negative.  Fact Sheet for Patients: HairSlick.no  Fact Sheet for Healthcare  Providers: quierodirigir.com  This test is not yet approved or cleared by the [place] FDA and  has been authorized for detection and/or diagnosis of SARS-CoV-2 by FDA under an Emergency Use Authorization (EUA). This EUA will remain  in effect (meaning this test can be used) for the duration of the COVID-19 declaration under Section 564(b)(1) of the Act, 21 U. S.C. section 360bbb-3(b)(1), unless the authorization is terminated or revoked sooner.   Performed at [facility], [address] [place], [place] [address]   Group A Strep by PCR     Status: None   Collection Time: 01/03/21 12:04 PM   Specimen: Nasopharyngeal Swab; Sterile Swab  Result Value Ref Range Status   Group A Strep by PCR NOT DETECTED NOT DETECTED Final    Comment: Performed at [facility], [address], [place], [place] [address]  Urine culture     Status: Abnormal   Collection Time: 01/03/21  3:03 PM   Specimen: Urine, Random  Result Value Ref Range Status   Specimen Description   Final    URINE, RANDOM Performed at [facility], [address], [place], [place] [address]    Special Requests   Final    NONE Performed at [facility], [address], [place], [place] [address]    Culture (A)  Final    <10,000 COLONIES/mL INSIGNIFICANT GROWTH Performed at [facility], [address], [place], [place] [address]    Report Status 01/05/2021 FINAL  Final     Studies: CT Head Wo Contrast  Result Date: 01/07/2021 CLINICAL DATA:  Headache EXAM: CT HEAD WITHOUT CONTRAST TECHNIQUE: Contiguous axial images were obtained from the base of the skull through the vertex without intravenous contrast. COMPARISON:  09/13/2019 FINDINGS: Brain: No acute intracranial abnormality. Specifically, no hemorrhage, hydrocephalus, mass lesion, acute infarction, or significant intracranial injury. Vascular: No hyperdense vessel or unexpected calcification.  Skull: No acute calvarial abnormality. Sinuses/Orbits: No acute findings Other: None IMPRESSION: No acute intracranial abnormality. Electronically Signed   By: Charlett Nose M.D.   On: 01/07/2021 01:17   DG Chest Port 1 View  Result Date: 01/07/2021 CLINICAL DATA:  COVID, shortness of breath EXAM: PORTABLE CHEST 1 VIEW COMPARISON:  01/02/2021 FINDINGS: Heart and mediastinal contours are within normal limits. No focal opacities or effusions. No acute bony abnormality. IMPRESSION: No active disease. Electronically Signed   By: Charlett Nose M.D.   On: 01/07/2021 00:59    Scheduled Meds:  amoxicillin-clavulanate  1 tablet Oral Q12H   famotidine  20 mg Oral BID   Ipratropium-Albuterol  1 puff Inhalation Q6H WA   [START ON 01/10/2021] predniSONE  50 mg Oral Daily   Continuous  Infusions:  methylPREDNISolone (SOLU-MEDROL) injection 130 mg (01/07/21 1448)   [START ON 01/08/2021] remdesivir 100 mg in NS 100 mL      Assessment/Plan:  COVID-19 infection with wheezing was on Paxlovid as outpatient.  Started on remdesivir and Solu-Medrol here in the hospital. Headache and history of migraine.  Ordered IV magnesium.  On Solu-Medrol.  COVID can worsen things.  As needed Imitrex. Otitis media.  Start Augmentin Bilateral flank pain.  CT abdomen renal stone protocol.  Urine analysis negative. Morbid obesity with a BMI of 48.06 History of fibromyalgia Weakness vertigo.  Physical therapy evaluation    Code Status:     Code Status Orders  (From admission, onward)           Start     Ordered   01/07/21 0244  Full code  Continuous        01/07/21 0243           Code Status History     This patient has a current code status but no historical code status.      Family Communication: Updated primary contact in the computer Disposition Plan: Status is: Inpatient  Dispo: The patient is from: Home              Anticipated d/c is to: Home in the next day or so.              Patient currently  being treated for COVID-19 infection   Difficult to place patient.  No.  Time spent: 32 minutes  [employer]

## 2021-01-07 NOTE — ED Notes (Signed)
Patient ambulated around room with pulse oxi on room air.  O2 sat 100-99% entire time.  Patient reported feeling very weak and thought she might pass out.  MD notified.

## 2021-01-07 NOTE — ED Notes (Signed)
Pt eating dinner tray °

## 2021-01-07 NOTE — ED Notes (Signed)
Pt requesting meds for h/a. Alert and oriented. NAD noted. Speaking in complete sentences.

## 2021-01-07 NOTE — ED Notes (Signed)
Pharmacy contacted for inhaler 

## 2021-01-07 NOTE — Progress Notes (Signed)
Remdesivir - Pharmacy Brief Note   O:  ALT:  CXR:  SpO2: 100 % on RA   A/P:  Remdesivir 200 mg IVPB once followed by 100 mg IVPB daily x 4 days.   Colandra Ohanian D 01/07/2021 1:11 AM

## 2021-01-07 NOTE — ED Notes (Signed)
Pt to CT

## 2021-01-07 NOTE — ED Provider Notes (Signed)
[facility] Emergency Department Provider Note   ____________________________________________   Event Date/Time   First MD Initiated Contact with Patient 01/07/21 0016     (approximate)  I have reviewed the triage vital signs and the nursing notes.   HISTORY  Chief Complaint Headache    HPI Toni Robertson is a 35 y.o. female who returns to the ED via EMS from home with fever, headache, nonproductive cough, chest pain, shortness of breath, nausea and profound generalized weakness.  Patient has had 4 ED/urgent care visits +1 telemedicine visit since 01/01/2021.  Initially thought to have URI symptoms as she tested negative for COVID several times.  She did test positive for COVID on 01/03/2021.  PCP started Paxil elevated 2 days ago, took her off antibiotics and prednisone.  Patient reports worsening symptoms.  Denies abdominal pain, vomiting, dysuria or diarrhea.     Past Medical History:  Diagnosis Date  . Family history of adverse reaction to anesthesia    MATERNAL GRANDFATHER-HARD TO WAKE UP  . Fibromyalgia   . Headache    MIGRAINE  . History of kidney stones 06/2017  . History of methicillin resistant staphylococcus aureus (MRSA) 2013    There are no problems to display for this patient.   Past Surgical History:  Procedure Laterality Date  . IUD REMOVAL N/A 07/17/2017   Procedure: INTRAUTERINE DEVICE (IUD) REMOVAL;  Surgeon: Schermerhorn, Ihor Austin, MD;  Location: [facility] ORS;  Service: Gynecology;  Laterality: N/A;  . LAPAROSCOPIC TUBAL LIGATION Bilateral 07/17/2017   Procedure: LAPAROSCOPIC TUBAL LIGATION;  Surgeon: Feliberto Gottron, Ihor Austin, MD;  Location: [facility] ORS;  Service: Gynecology;  Laterality: Bilateral;    Prior to Admission medications   Medication Sig Start Date End Date Taking? Authorizing Provider  albuterol (PROVENTIL) (2.5 MG/3ML) 0.083% nebulizer solution Take 3 mLs (2.5 mg total) by nebulization every 6 (six) hours as needed for  wheezing or shortness of breath. 01/02/21   Irean Hong, MD  amoxicillin-clavulanate (AUGMENTIN ES-600) 600-42.9 MG/5ML suspension Take 5 mLs (600 mg total) by mouth every 12 (twelve) hours for 7 days. 01/02/21 01/09/21  Irean Hong, MD  benzonatate (TESSALON) 200 MG capsule Take 1 capsule (200 mg total) by mouth 3 (three) times daily as needed for cough. 01/01/21   Domenick Gong, MD  buPROPion (WELLBUTRIN XL) 300 MG 24 hr tablet Take by mouth. 01/28/18   [provider]  chlorpheniramine-HYDROcodone (TUSSIONEX PENNKINETIC ER) 10-8 MG/5ML SUER Take 5 mLs by mouth every 12 (twelve) hours as needed for cough. 01/01/21   Domenick Gong, MD  chlorpheniramine-HYDROcodone [facility] PENNKINETIC ER) 10-8 MG/5ML SUER Take 5 mLs by mouth 2 (two) times daily. 01/03/21   Joni Reining, PA-C  DULoxetine (CYMBALTA) 20 MG capsule Take 20 mg by mouth daily.    [provider]  famotidine (PEPCID) 20 MG tablet Take 1 tablet (20 mg total) by mouth 2 (two) times daily. 01/01/21   Domenick Gong, MD  gabapentin (NEURONTIN) 300 MG capsule Take 300 mg by mouth 3 (three) times daily.    [provider]  ibuprofen (ADVIL) 600 MG tablet Take 1 tablet (600 mg total) by mouth every 6 (six) hours as needed. 01/01/21   Domenick Gong, MD  levonorgestrel (MIRENA) 20 MCG/24HR IUD 1 each by Intrauterine route once.    [provider]  lidocaine (XYLOCAINE) 2 % solution Use as directed 5 mLs in the mouth or throat every 6 (six) hours as needed for mouth pain. For swish and swallow. 01/03/21  Joni Reining, PA-C  magic mouthwash SOLN Take 5 mLs by mouth 3 (three) times daily as needed for mouth pain. 01/02/21   Irean Hong, MD  montelukast (SINGULAIR) 10 MG tablet Take 10 mg by mouth at bedtime.    [provider]  Nebulizers (COMPRESSOR/NEBULIZER) MISC 1 Units by Does not apply route every 4 (four) hours as needed. 01/02/21   Irean Hong, MD  predniSONE (DELTASONE) 20 MG tablet  3 tablets daily x 4 days 01/02/21   Irean Hong, MD    Allergies Coconut flavor and Azithromycin  Family History  Problem Relation Age of Onset  . Hypertension Mother   . Diabetes Father     Social History Social History   Tobacco Use  . Smoking status: Never  . Smokeless tobacco: Never  Vaping Use  . Vaping Use: Never used  Substance Use Topics  . Alcohol use: Yes    Comment: social  . Drug use: No    Review of Systems  Constitutional: Positive for fever and generalized weakness Eyes: No visual changes. ENT: Positive for nasal congestion and sore throat. Cardiovascular: Positive for chest pain. Respiratory: Positive for cough and shortness of breath. Gastrointestinal: No abdominal pain.  Positive for nausea, no vomiting.  No diarrhea.  No constipation. Genitourinary: Negative for dysuria. Musculoskeletal: Negative for back pain. Skin: Negative for rash. Neurological: Positive for headache. Negative for focal weakness or numbness.   ____________________________________________   PHYSICAL EXAM:  VITAL SIGNS: ED Triage Vitals  Enc Vitals Group     BP 01/06/21 2030 (!) 154/100     Pulse Rate 01/06/21 2030 (!) 102     Resp 01/06/21 2030 20     Temp 01/06/21 2030 99.2 F (37.3 C)     Temp Source 01/06/21 2030 Oral     SpO2 01/06/21 2030 100 %     Weight 01/06/21 2037 280 lb (127 kg)     Height 01/06/21 2037 5\' 4"  (1.626 m)     Head Circumference --      Peak Flow --      Pain Score --      Pain Loc --      Pain Edu? --      Excl. in GC? --     Constitutional: Alert and oriented.  Tired appearing and in mild acute distress. Eyes: Conjunctivae are normal. PERRL. EOMI. Head: Atraumatic. Nose: No congestion/rhinnorhea. Mouth/Throat: Mucous membranes are mildly dry.  Oropharynx nonerythematous without tonsillar swelling, exudates or peritonsillar abscess.  There is no hoarse or muffled voice.  There is no drooling. Neck: No stridor.  Supple neck without  meningismus. Cardiovascular: Normal rate, regular rhythm. Grossly normal heart sounds.  Good peripheral circulation. Respiratory: Normal respiratory effort.  No retractions. Lungs CTAB. Gastrointestinal: Soft and nontender to light or deep palpation. No distention. No abdominal bruits. No CVA tenderness. Musculoskeletal: No lower extremity tenderness nor edema.  No joint effusions. Neurologic: Alert and oriented x3.  CN II to XII grossly intact.  Normal speech and language. No gross focal neurologic deficits are appreciated. No gait instability. Skin:  Skin is warm, dry and intact. No rash noted.  No petechiae. Psychiatric: Mood and affect are normal. Speech and behavior are normal.  ____________________________________________   LABS (all labs ordered are listed, but only abnormal results are displayed)  Labs Reviewed  CBC WITH DIFFERENTIAL/PLATELET  COMPREHENSIVE METABOLIC PANEL  BRAIN NATRIURETIC PEPTIDE  D-DIMER, QUANTITATIVE  URINALYSIS, COMPLETE (UACMP) WITH MICROSCOPIC  HIV ANTIBODY (ROUTINE  TESTING W REFLEX)  C-REACTIVE PROTEIN  FERRITIN  FIBRINOGEN  HEPATITIS B SURFACE ANTIGEN  LACTATE DEHYDROGENASE  PROCALCITONIN  ABO/RH  TYPE AND SCREEN  TROPONIN I (HIGH SENSITIVITY)   ____________________________________________  EKG  ED ECG REPORT I, Dewarren Ledbetter J, the attending physician, personally viewed and interpreted this ECG.   Date: 01/07/2021  EKG Time: 0116  Rate: 90  Rhythm: normal EKG, normal sinus rhythm  Axis: Normal  Intervals:none  ST&T Change: Nonspecific  ____________________________________________  RADIOLOGY I, Amenah Tucci J, personally viewed and evaluated these images (plain radiographs) as part of my medical decision making, as well as reviewing the written report by the radiologist.  ED MD interpretation: No acute cardiopulmonary process; no ICH  Official radiology report(s): CT Head Wo Contrast  Result Date: 01/07/2021 CLINICAL DATA:  Headache  EXAM: CT HEAD WITHOUT CONTRAST TECHNIQUE: Contiguous axial images were obtained from the base of the skull through the vertex without intravenous contrast. COMPARISON:  09/13/2019 FINDINGS: Brain: No acute intracranial abnormality. Specifically, no hemorrhage, hydrocephalus, mass lesion, acute infarction, or significant intracranial injury. Vascular: No hyperdense vessel or unexpected calcification. Skull: No acute calvarial abnormality. Sinuses/Orbits: No acute findings Other: None IMPRESSION: No acute intracranial abnormality. Electronically Signed   By: Charlett Nose M.D.   On: 01/07/2021 01:17   DG Chest Port 1 View  Result Date: 01/07/2021 CLINICAL DATA:  COVID, shortness of breath EXAM: PORTABLE CHEST 1 VIEW COMPARISON:  01/02/2021 FINDINGS: Heart and mediastinal contours are within normal limits. No focal opacities or effusions. No acute bony abnormality. IMPRESSION: No active disease. Electronically Signed   By: Charlett Nose M.D.   On: 01/07/2021 00:59    ____________________________________________   PROCEDURES  Procedure(s) performed (including Critical Care):  .1-3 Lead EKG Interpretation  Date/Time: 01/07/2021 12:59 AM Performed by: Irean Hong, MD Authorized by: Irean Hong, MD     Interpretation: abnormal     ECG rate:  102   ECG rate assessment: tachycardic     Rhythm: sinus tachycardia     Ectopy: none     Conduction: normal   Comments:     Patient placed on cardiac monitor to evaluate for arrhythmias   ____________________________________________   INITIAL IMPRESSION / ASSESSMENT AND PLAN / ED COURSE  As part of my medical decision making, I reviewed the following data within the electronic MEDICAL RECORD NUMBER Nursing notes reviewed and incorporated, Labs reviewed, EKG interpreted, Old chart reviewed, Radiograph reviewed, Discussed with admitting physician, and Notes from prior ED visits     35 year old female who returns to the ED with multiple medical complaints  related to COVID-19 infection. Differential includes, but is not limited to, viral syndrome, bronchitis including COPD exacerbation, pneumonia, reactive airway disease including asthma, CHF including exacerbation with or without pulmonary/interstitial edema, pneumothorax, ACS, thoracic trauma, and pulmonary embolism.   Will obtain lab work including D-dimer, CT head, chest x-ray.  Will perform ambulation trial.  Clinical Course as of 01/07/21 0136  Mon Jan 07, 2021  0100 Patient was not hypoxic during ambulation trial but had near syncopal episode secondary to dizziness and generalized weakness. [JS]    Clinical Course User Index [JS] Irean Hong, MD     ____________________________________________   FINAL CLINICAL IMPRESSION(S) / ED DIAGNOSES  Final diagnoses:  COVID-19  Weakness     ED Discharge Orders     None        Note:  This document was prepared using Dragon voice recognition software and may include unintentional dictation errors.  Irean HongSung, Margaurite Salido J, MD 01/07/21 [phone]

## 2021-01-07 NOTE — ED Notes (Signed)
Report messaged to debra rn floor nurse 

## 2021-01-07 NOTE — ED Notes (Signed)
  Pt on cell phone.  Pt eating icie.  Pt alert  No resp distress.

## 2021-01-08 ENCOUNTER — Inpatient Hospital Stay: Payer: BC Managed Care – PPO

## 2021-01-08 ENCOUNTER — Other Ambulatory Visit: Payer: Self-pay

## 2021-01-08 DIAGNOSIS — Z6841 Body Mass Index (BMI) 40.0 and over, adult: Secondary | ICD-10-CM

## 2021-01-08 DIAGNOSIS — I10 Essential (primary) hypertension: Secondary | ICD-10-CM

## 2021-01-08 IMAGING — MR MR MRV HEAD WO/W CM
4 of 6 series · 24 of 48 positions shown · IV contrast (gadavist)
Comparison: Head CT [DATE]

CLINICAL DATA: Neuro deficit, acute, stroke suspected.

EXAM:
MRI HEAD WITHOUT AND WITH CONTRAST
MR VENOGRAM HEAD WITHOUT AND WITH CONTRAST
TECHNIQUE: Multiplanar, multi-echo pulse sequences of the brain and surrounding
structures were acquired without and with intravenous contrast.
Angiographic images of the intracranial venous structures were
acquired using MRV technique without and with intravenous contrast.
CONTRAST:  10mL GADAVIST GADOBUTROL 1 MMOL/ML IV SOLN

[Series 1: TOF · coronal · 2.5mm · 0.98mm/px · 8 of 110 slices shown]
[im 1/110]
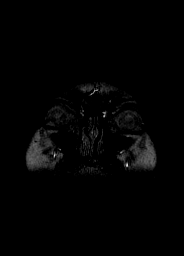
[im 16/110]
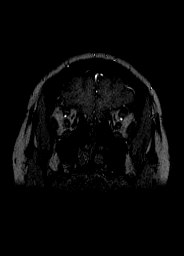
[im 32/110]
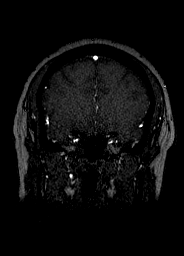
[im 47/110]
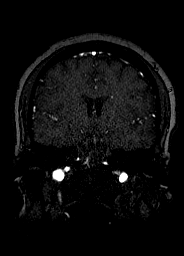
[im 63/110]
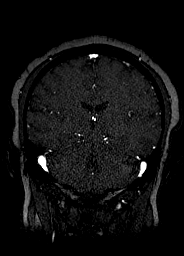
[im 78/110]
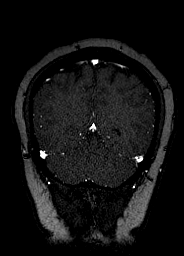
[im 94/110]
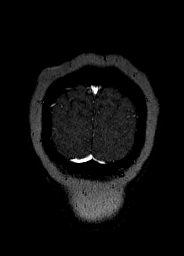
[im 110/110]
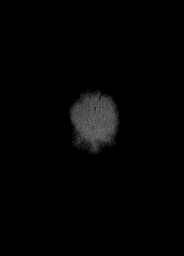

[Series 8: T1 · sagittal · 1.0mm · 0.98mm/px · 9 of 176 slices shown]
[im 1/176]
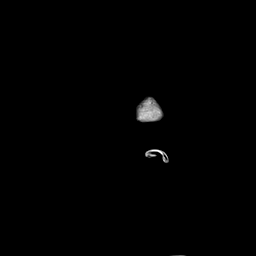
[im 32/176]
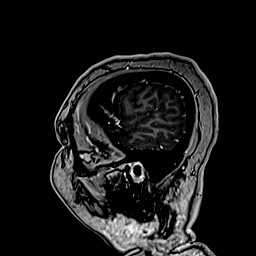
[im 48/176]
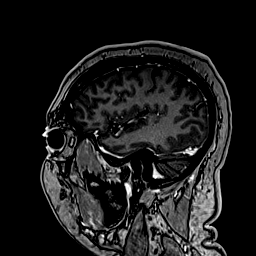
[im 80/176]
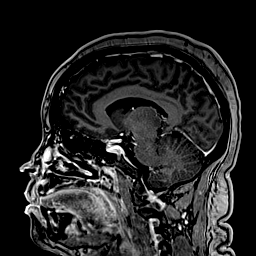
[im 96/176]
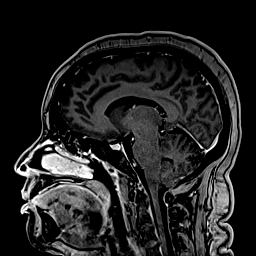
[im 128/176]
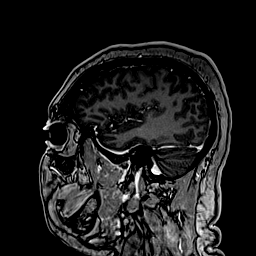
[im 144/176]
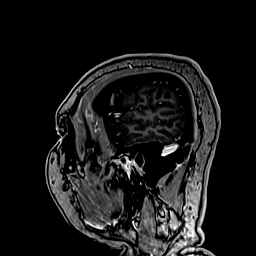
[im 160/176]
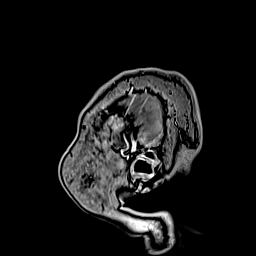
[im 176/176]
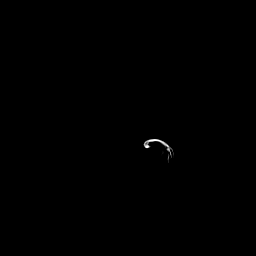

[Series 1049: mpr cor 1mm · coronal · 1.0mm · 0.31mm/px · 4 of 189 slices shown]
[im 1/189]
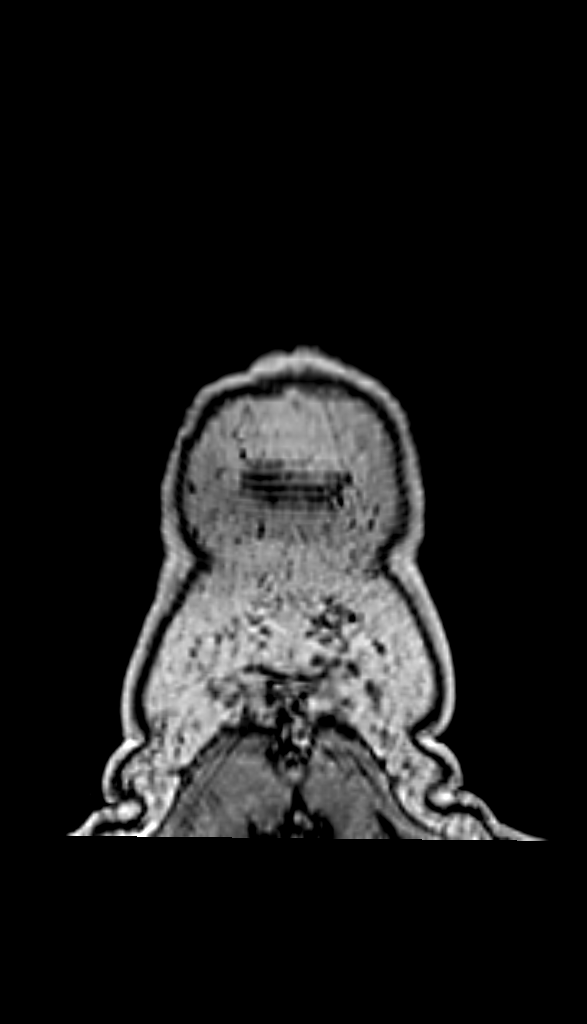
[im 35/189]
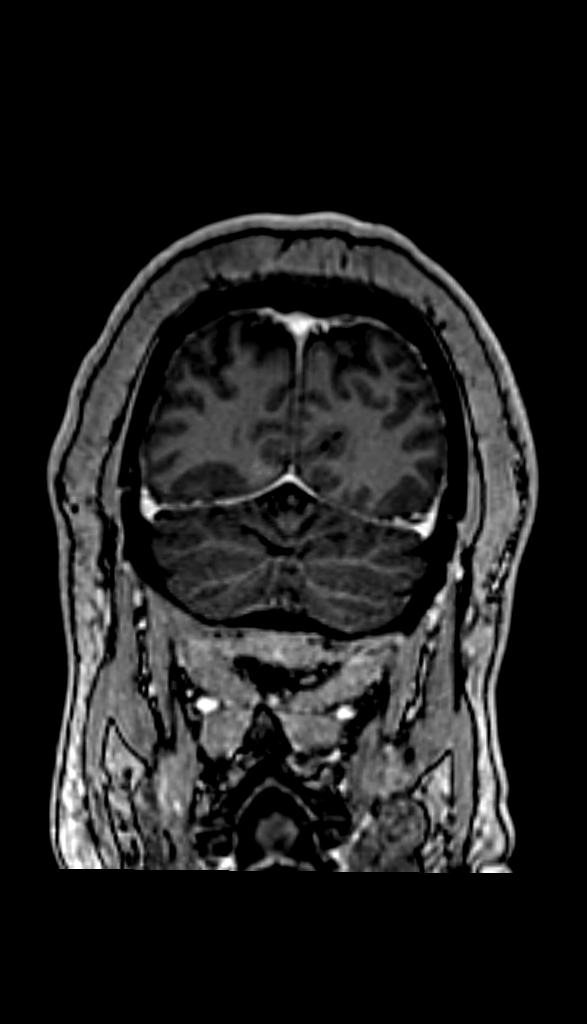
[im 103/189]
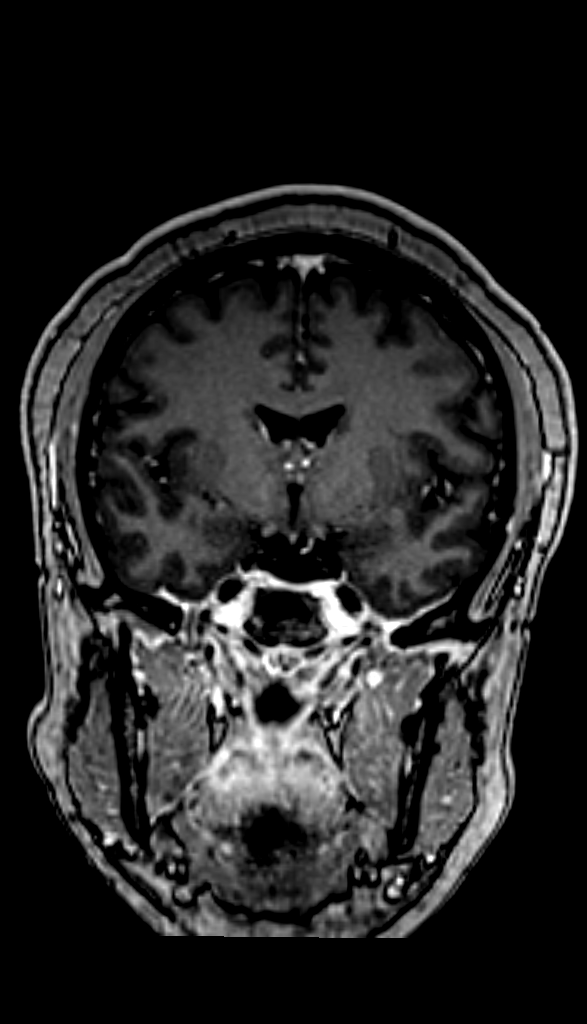
[im 171/189]
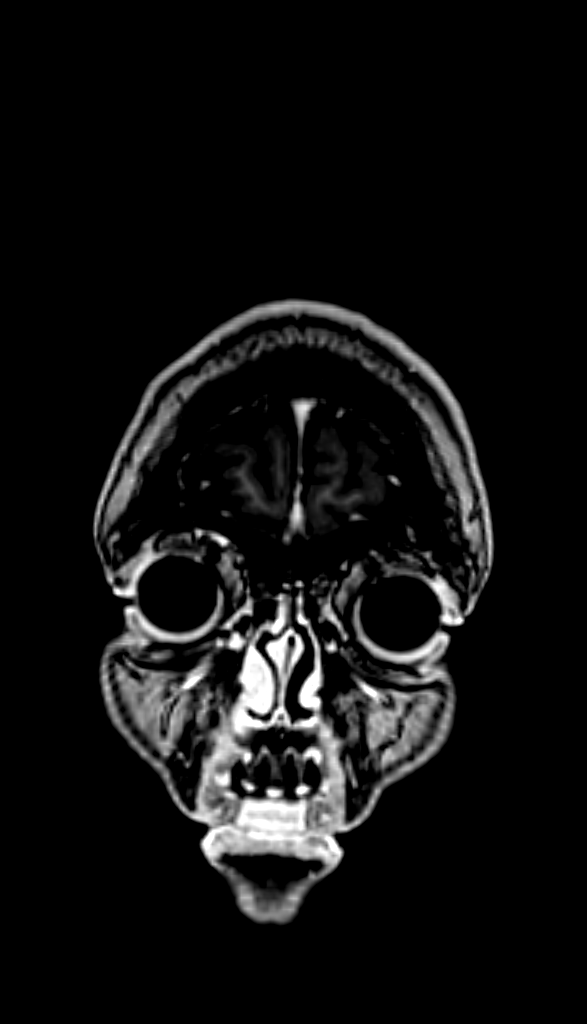

[Series 1055: mpr tra 1mm · axial · 1.0mm · 0.31mm/px · z∈[-43,+77]mm · 3 of 159 slices shown]
[im 18/159]
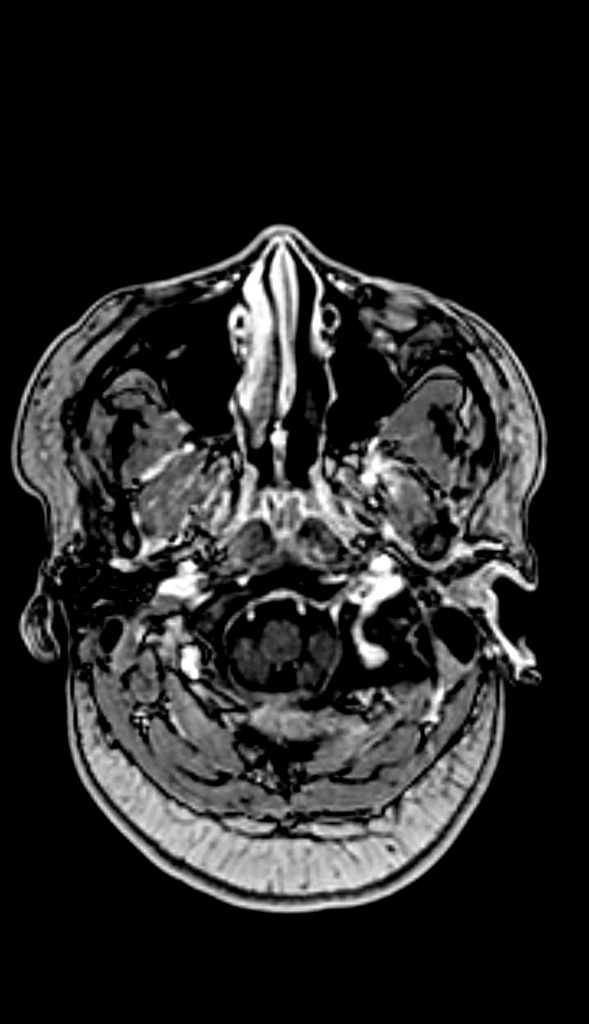
[im 88/159]
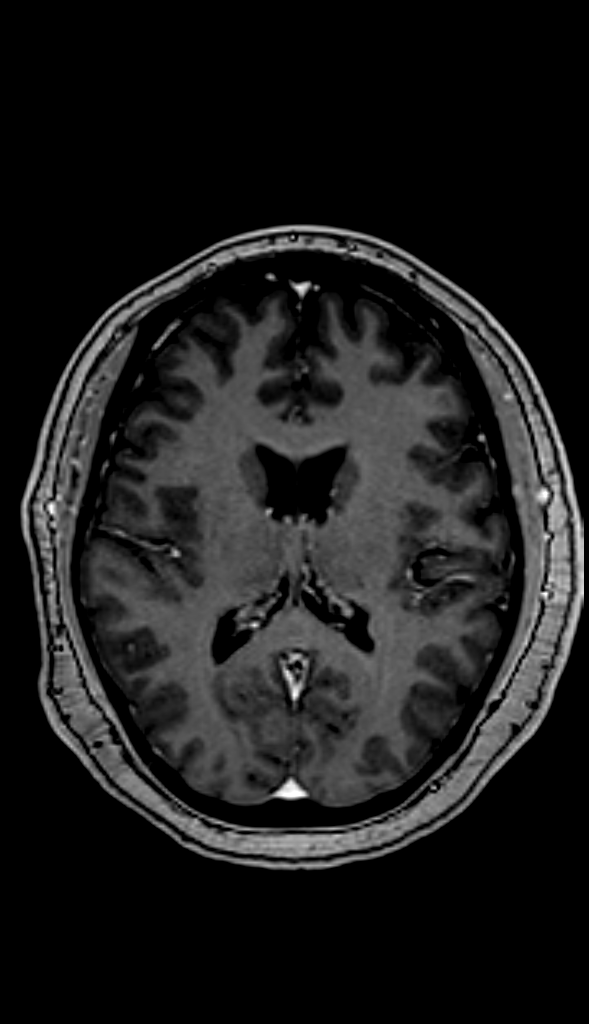
[im 141/159]
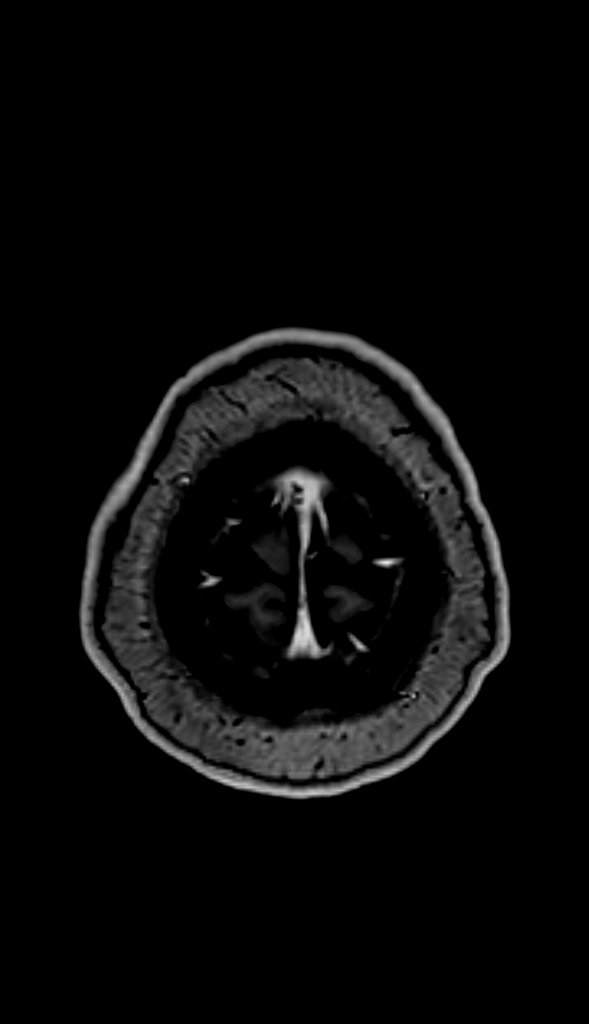

[24 of 48 positions shown; findings below may reference images not displayed]

FINDINGS: MRI HEAD WITHOUT AND WITH CONTRAST

Brain: No acute infarction, hemorrhage, hydrocephalus, extra-axial
collection or mass lesion. The brain parenchyma has normal
morphology and signal characteristics. No pathologic intracranial
enhancement.

Vascular: Normal flow voids.

Skull and upper cervical spine: No focal marrow lesion.

Sinuses/Orbits: No acute or significant finding.

MR VENOGRAM HEAD WITHOUT AND WITH CONTRAST

There is no evidence of dural venous sinus or deep cerebral vein
thrombosis. No dural venous sinus stenosis.
IMPRESSION: 1. No acute intracranial abnormality.
2. Unremarkable MRI and MR venogram of the brain.

## 2021-01-08 IMAGING — MR MR HEAD W/O CM
12 series · 48 of 48 positions shown · IV contrast (gadavist)
Comparison: Head CT [DATE]

CLINICAL DATA: Neuro deficit, acute, stroke suspected.

EXAM:
MRI HEAD WITHOUT AND WITH CONTRAST
MR VENOGRAM HEAD WITHOUT AND WITH CONTRAST
TECHNIQUE: Multiplanar, multi-echo pulse sequences of the brain and surrounding
structures were acquired without and with intravenous contrast.
Angiographic images of the intracranial venous structures were
acquired using MRV technique without and with intravenous contrast.
CONTRAST:  10mL GADAVIST GADOBUTROL 1 MMOL/ML IV SOLN

[Series 5: ax dwi_tracew · axial · 3.0mm · 0.71mm/px · z∈[-98,+60]mm · 3 of 56 slices shown]
[im 1/56]
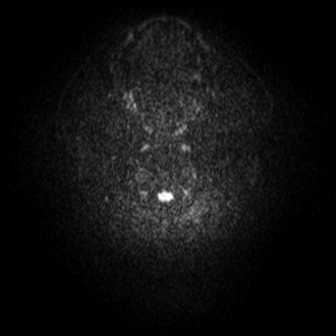
[im 28/56]
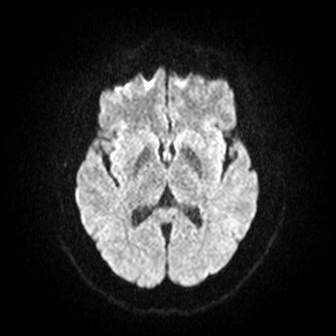
[im 56/56]
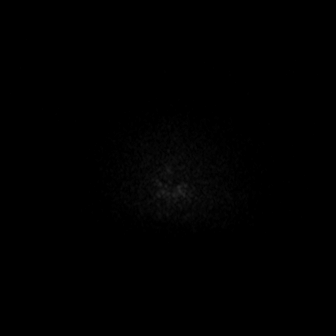

[Series 6: ax dwi_adc · axial · 3.0mm · 0.71mm/px · z∈[-98,+60]mm · 4 of 56 slices shown]
[im 1/56]
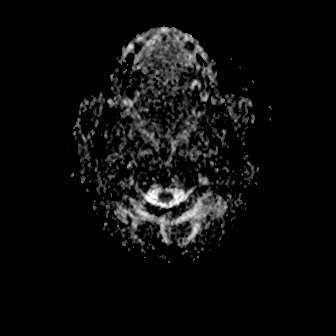
[im 19/56]
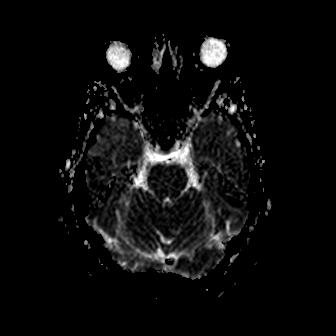
[im 37/56]
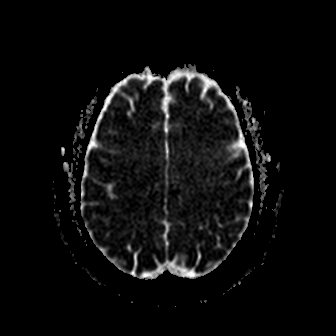
[im 56/56]
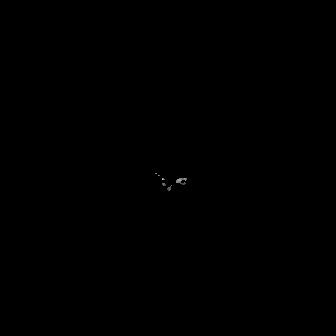

[Series 7: cor dwi_tracew · coronal · 5.0mm · 0.68mm/px · 3 of 40 slices shown]
[im 1/40]
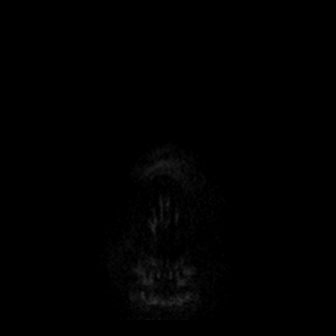
[im 20/40]
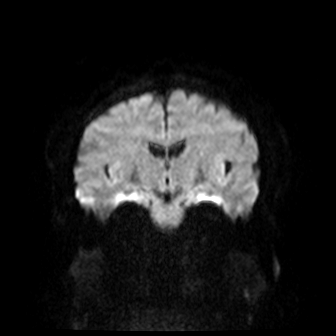
[im 40/40]
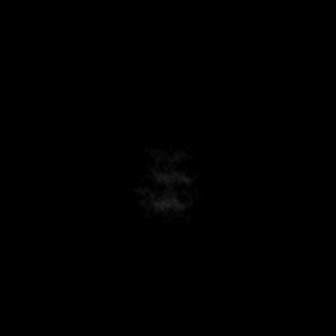

[Series 8: cor dwi_adc · coronal · 5.0mm · 0.68mm/px · 3 of 40 slices shown]
[im 1/40]
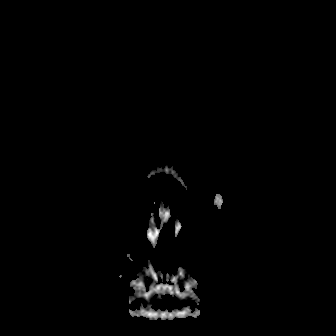
[im 20/40]
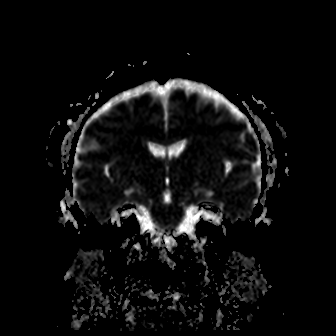
[im 40/40]
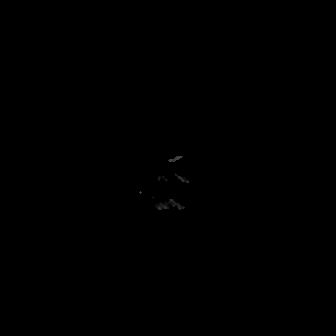

[Series 9: T1 · sagittal · 5.0mm · 0.47mm/px · 2 of 24 slices shown (1 of 2)]
[im 1/24]
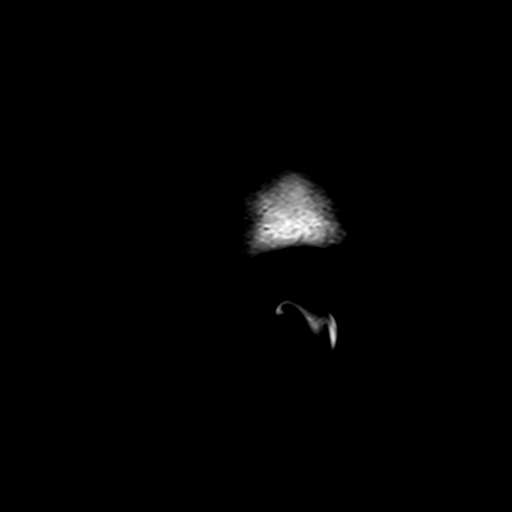
[im 24/24]
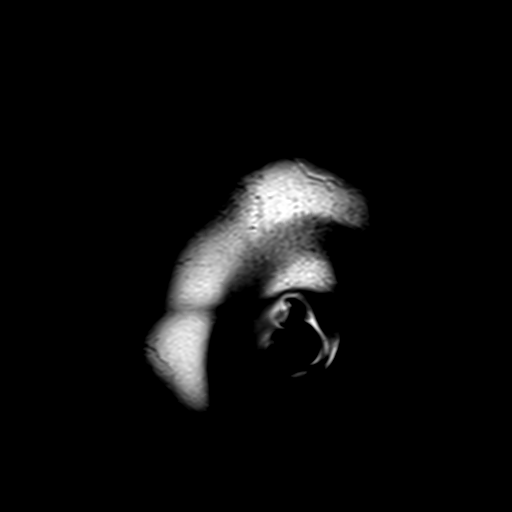

[Series 10: T2 · axial · 5.0mm · 0.86mm/px · z∈[-85,+53]mm · 2 of 25 slices shown (1 of 2)]
[im 1/25]
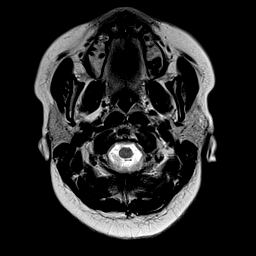
[im 25/25]
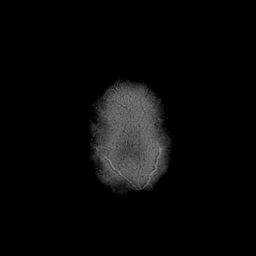

[Series 11: mag_images · axial · 3.0mm · 0.90mm/px · z∈[-91,+56]mm · 4 of 52 slices shown]
[im 1/52]
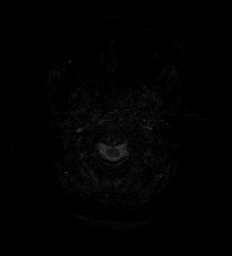
[im 18/52]
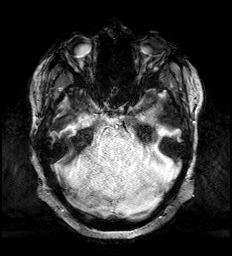
[im 35/52]
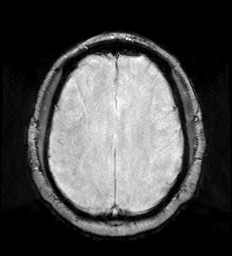
[im 52/52]
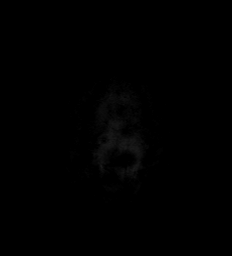

[Series 12: pha_images · axial · 3.0mm · 0.90mm/px · z∈[-91,+56]mm · 4 of 52 slices shown]
[im 1/52]
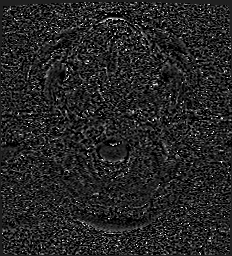
[im 18/52]
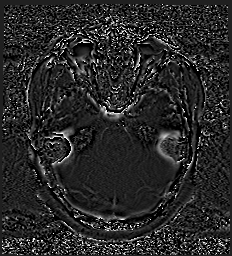
[im 35/52]
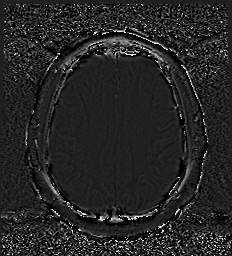
[im 52/52]
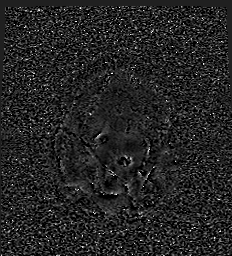

[Series 13: swi_images · axial · 3.0mm · 0.90mm/px · z∈[-91,+56]mm · 4 of 52 slices shown]
[im 1/52]
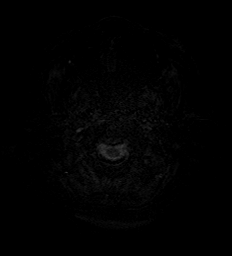
[im 18/52]
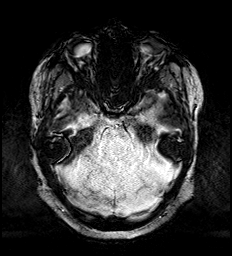
[im 35/52]
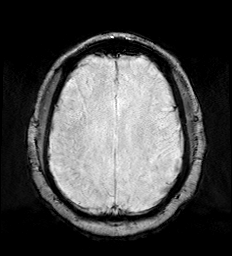
[im 52/52]
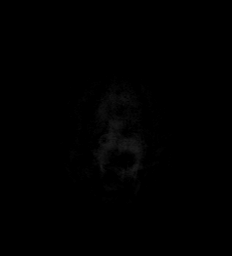

[Series 15: FLAIR · axial · 3.0mm · 0.69mm/px · z∈[-94,+61]mm · 4 of 55 slices shown]
[im 1/55]
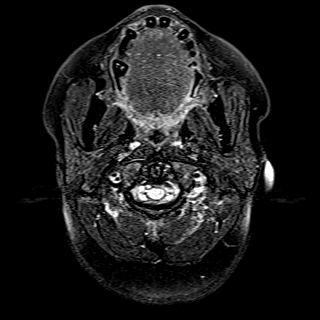
[im 19/55]
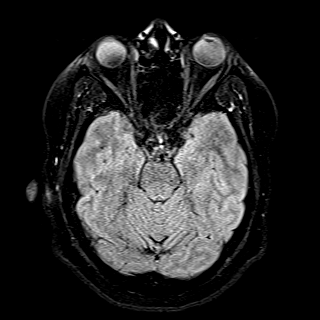
[im 37/55]
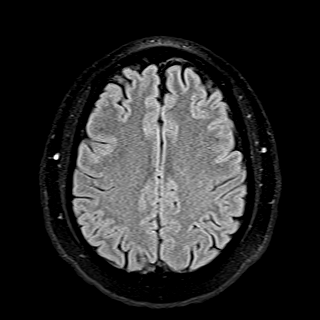
[im 55/55]
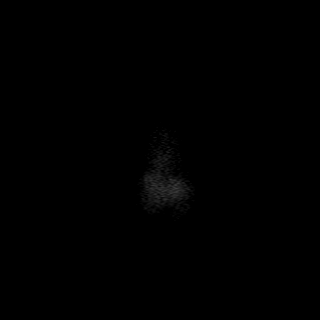

[Series 16: T1 · axial · 1.0mm · 0.98mm/px · z∈[-101,+65]mm · 13 of 172 slices shown (2 of 2)]
[im 1/172]
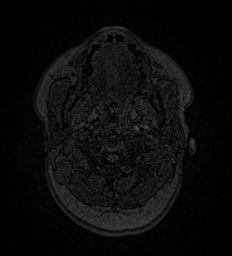
[im 15/172]
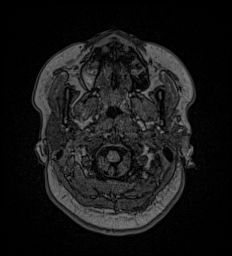
[im 29/172]
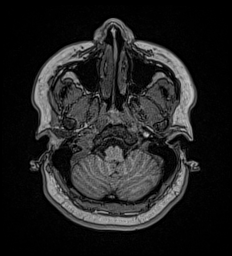
[im 43/172]
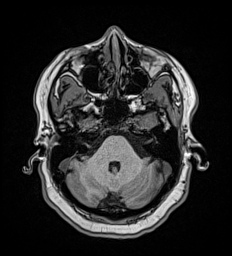
[im 58/172]
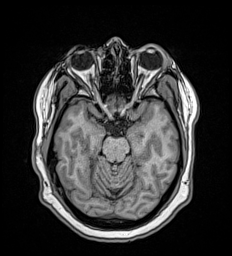
[im 72/172]
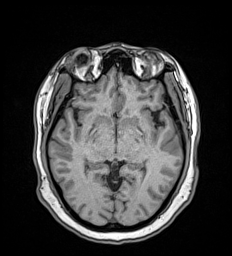
[im 86/172]
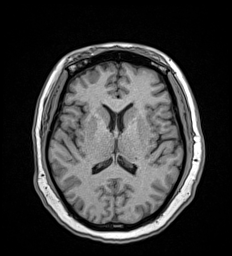
[im 100/172]
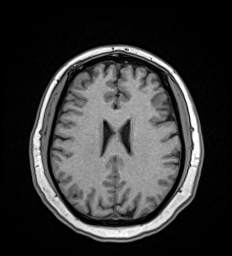
[im 115/172]
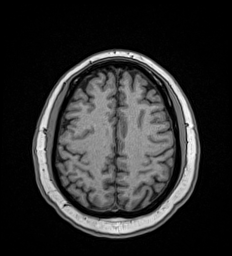
[im 129/172]
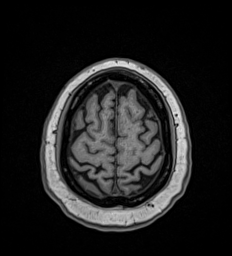
[im 143/172]
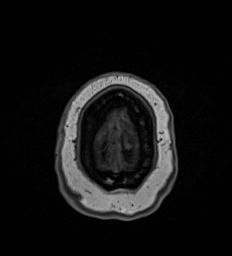
[im 157/172]
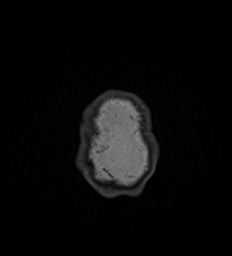
[im 172/172]
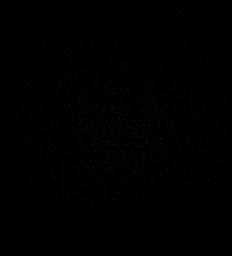

[Series 17: T2 · coronal · 5.0mm · 0.86mm/px · 2 of 30 slices shown (2 of 2)]
[im 1/30]
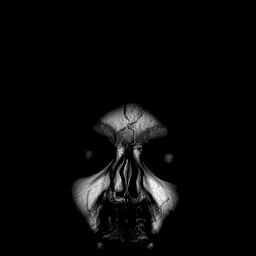
[im 30/30]
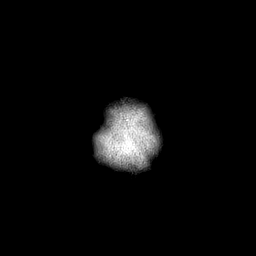

[48 of 48 positions shown; findings below may reference images not displayed]

FINDINGS: MRI HEAD WITHOUT AND WITH CONTRAST

Brain: No acute infarction, hemorrhage, hydrocephalus, extra-axial
collection or mass lesion. The brain parenchyma has normal
morphology and signal characteristics. No pathologic intracranial
enhancement.

Vascular: Normal flow voids.

Skull and upper cervical spine: No focal marrow lesion.

Sinuses/Orbits: No acute or significant finding.

MR VENOGRAM HEAD WITHOUT AND WITH CONTRAST

There is no evidence of dural venous sinus or deep cerebral vein
thrombosis. No dural venous sinus stenosis.
IMPRESSION: 1. No acute intracranial abnormality.
2. Unremarkable MRI and MR venogram of the brain.

## 2021-01-08 MED ORDER — LEVOFLOXACIN 750 MG PO TABS
750.0000 mg | ORAL_TABLET | Freq: Every day | ORAL | Status: DC
  Administered 2021-01-08 – 2021-01-09 (×2): 750 mg via ORAL
  Filled 2021-01-08 (×2): qty 1

## 2021-01-08 MED ORDER — METHYLPREDNISOLONE SODIUM SUCC 40 MG IJ SOLR
40.0000 mg | Freq: Two times a day (BID) | INTRAMUSCULAR | Status: DC
  Administered 2021-01-08 – 2021-01-09 (×2): 40 mg via INTRAVENOUS
  Filled 2021-01-08 (×2): qty 1

## 2021-01-08 MED ORDER — FUROSEMIDE 40 MG PO TABS
40.0000 mg | ORAL_TABLET | Freq: Every day | ORAL | Status: DC
  Administered 2021-01-08 – 2021-01-12 (×5): 40 mg via ORAL
  Filled 2021-01-08 (×5): qty 1

## 2021-01-08 MED ORDER — GADOBUTROL 1 MMOL/ML IV SOLN
10.0000 mL | Freq: Once | INTRAVENOUS | Status: AC | PRN
  Administered 2021-01-08: 10 mL via INTRAVENOUS

## 2021-01-08 MED ORDER — AMLODIPINE BESYLATE 5 MG PO TABS
5.0000 mg | ORAL_TABLET | Freq: Every day | ORAL | Status: DC
  Administered 2021-01-08 – 2021-01-12 (×5): 5 mg via ORAL
  Filled 2021-01-08 (×5): qty 1

## 2021-01-08 MED ORDER — HYDROCHLOROTHIAZIDE 12.5 MG PO CAPS: 12.5000 mg | ORAL_CAPSULE | Freq: Every day | ORAL | Status: DC

## 2021-01-08 MED ORDER — SUMATRIPTAN SUCCINATE 50 MG PO TABS
50.0000 mg | ORAL_TABLET | Freq: Once | ORAL | Status: AC
  Administered 2021-01-08: 20:00:00 50 mg via ORAL
  Filled 2021-01-08: qty 1

## 2021-01-08 MED ORDER — TOPIRAMATE 25 MG PO TABS
25.0000 mg | ORAL_TABLET | Freq: Every day | ORAL | Status: DC
  Administered 2021-01-08 – 2021-01-11 (×4): 25 mg via ORAL
  Filled 2021-01-08 (×5): qty 1

## 2021-01-08 MED ORDER — DIPHENHYDRAMINE HCL 25 MG PO CAPS
25.0000 mg | ORAL_CAPSULE | Freq: Four times a day (QID) | ORAL | Status: DC | PRN
  Administered 2021-01-08 – 2021-01-12 (×8): 25 mg via ORAL
  Filled 2021-01-08 (×9): qty 1

## 2021-01-08 NOTE — Evaluation (Signed)
Physical Therapy Evaluation Patient Details Name: Toni Robertson MRN: [id number] DOB: August 25, 1985 Today's Date: 01/08/2021   History of Present Illness  Toni Robertson is a 35yoF who [facility] on [facility]21 after 3d HA at home. Pt (+) COVID19 01/03/21. Pt reports severe generalized weakness, presyncopal prodrome in when AMB to BR with RN;currently satting on room air. Pt reports hqaving Covid in 2020 with less severeity of symptoms.  Clinical Impression  Pt admitted with above diagnosis. Pt currently with functional limitations due to the deficits listed below (see "PT Problem List"). Upon entry, pt in bed, awake and agreeable to participate. The pt is alert, pleasant, interactive, and able to provide info regarding prior level of function, both in tolerance and independence. Pt is somewhat anxious due to extreme decline in activity tolerance as she is typically very active/energetic at baseline. ModI bed mobility and transfers this date. Orthostatic vitals assessed, negative, however generally quite hypertensive, MD made aware. AMB deferred due to HTN. Patient's performance this date reveals decreased ability, independence, and tolerance in performing all basic mobility required for performance of activities of daily living. Pt requires additional DME, close physical assistance, and cues for safe participate in mobility. Pt will benefit from skilled PT intervention to increase independence and safety with basic mobility in preparation for discharge to the venue listed below.      01/08/21 0950  Therapy Vitals  BP (!) 213/103 (Rt antebrachial, regular cuff)  Patient Position (if appropriate) Orthostatic Vitals  Orthostatic Lying   BP- Lying (!) 176/109 (bariatric cuff, left brachial (cuff too small))  Pulse- Lying 107  Orthostatic Sitting  BP- Sitting (!) 178/102  Pulse- Sitting 101  Orthostatic Standing at 0 minutes  BP- Standing at 0 minutes 179/82  Pulse- Standing at 0 minutes 117   Orthostatic Standing at 3 minutes  BP- Standing at 3 minutes (!) 191/132  Pulse- Standing at 3 minutes 119  Oxygen Therapy  O2 Flow Rate (L/min) 100 L/min      Follow Up Recommendations Home health PT    Equipment Recommendations  Rolling walker with 5" wheels    Recommendations for Other Services       Precautions / Restrictions Precautions Precautions: Fall Restrictions Weight Bearing Restrictions: No      Mobility  Bed Mobility Overal bed mobility: Modified Independent                  Transfers Overall transfer level: Needs assistance Equipment used: Rolling walker (2 wheeled) Transfers: Sit to/from Stand Sit to Stand: Supervision            Ambulation/Gait Ambulation/Gait assistance:  (deferred due to elevated pressures)              Stairs            Wheelchair Mobility    Modified Rankin (Stroke Patients Only)       Balance Overall balance assessment: Modified Independent (shaking in stance, but no LOB seen.)                                           Pertinent Vitals/Pain Pain Assessment: No/denies pain    Home Living Family/patient expects to be discharged to:: Private residence Living Arrangements: Children (15yo Son) Available Help at Discharge: Family (could do online delivery as needed) Type of Home: House Home Access: Stairs to enter Entrance Stairs-Rails: None Entrance [employer]  of Steps: 3 Home Layout: One level Home Equipment: None      Prior Function Level of Independence: Independent         Comments: 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, homehealth/hospice assitant     Hand Dominance   Dominant Hand: Right    Extremity/Trunk Assessment   Upper Extremity Assessment Upper Extremity Assessment: Generalized weakness;Overall [facility] for tasks assessed    Lower Extremity Assessment Lower Extremity Assessment: Generalized weakness;Overall [facility] for tasks assessed       Communication       Cognition Arousal/Alertness: Awake/alert Behavior During Therapy: [facility] for tasks assessed/performed;Anxious Overall Cognitive Status: Within Functional Limits for tasks assessed                                        General Comments      Exercises     Assessment/Plan    PT Assessment Patient needs continued PT services  PT Problem List Decreased strength;Decreased range of motion;Decreased activity tolerance;Decreased balance;Decreased mobility       PT Treatment Interventions DME instruction;Balance training;Gait training;Stair training;Therapeutic activities;Therapeutic exercise;Functional mobility training;Patient/family education    PT Goals (Current goals can be found in the Care Plan section)  Acute Rehab PT Goals Patient Stated Goal: regain independence in ADL PT Goal Formulation: With patient Time For Goal Achievement: 01/22/21 Potential to Achieve Goals: Good    Frequency Min 2X/week   Barriers to discharge Decreased caregiver support      Co-evaluation               AM-PAC PT "6 Clicks" Mobility  Outcome Measure Help needed turning from your back to your side while in a flat bed without using bedrails?: None Help needed moving from lying on your back to sitting on the side of a flat bed without using bedrails?: None Help needed moving to and from a bed to a chair (including a wheelchair)?: None Help needed standing up from a chair using your arms (e.g., wheelchair or bedside chair)?: None Help needed to walk in hospital room?: A Little Help needed climbing 3-5 steps with a railing? : A Little 6 Click Score: 22    End of Session   Activity Tolerance: Patient tolerated treatment well;No increased pain;Treatment limited secondary to medical complications (Comment) Patient left: in chair;with call bell/phone within reach;with chair alarm set Nurse Communication: Mobility status PT Visit Diagnosis: Difficulty in walking, not elsewhere  classified (R26.2);Other abnormalities of gait and mobility (R26.89);Muscle weakness (generalized) (M62.81)    Time: 5732-2025 PT Time Calculation (min) (ACUTE ONLY): 46 min   Charges:   PT Evaluation $PT Eval Low Complexity: 1 Low PT Treatments $Therapeutic Activity: 8-22 mins       12:18 PM, 01/08/21 Rosamaria Lints, PT, DPT Physical Therapist - [facility]  [phone] (ASCOM)    Sevag Shearn C 01/08/2021, 12:14 PM

## 2021-01-08 NOTE — Progress Notes (Signed)
Patient ID: Toni Robertson, female   DOB: 01/07/1986, 35 y.o.   MRN: [id number] [facility] PROGRESS NOTE  Toni Robertson WUJ:[id number] DOB: 01/07/1986 DOA: 01/06/2021 [place]CP: [place], [facility]  HPI/Subjective: Initially admitted with headaches and being COVID-positive.  Patient very upset that she could not do well with physical therapy just felt total body weakness.  Not weaker one side versus the other.  She just felt very shaky even just walking to the bathroom.  Blood pressure elevated with working with PT.  Patient does feel short of breath with walking.  Patient admitted with COVID-19 infection and wheezing.  Also having headache.  Yesterday had some flank pain.  Today having some itching.  No complaints of neck pain or back pain.  Objective: Vitals:   01/08/21 0950 01/08/21 1114  BP: (!) 213/103 (!) 157/104  Pulse:  (!) 107  Resp:  18  Temp:  98.2 F (36.8 C)  SpO2:  98%   No intake or output data in the 24 hours ending 01/08/21 1250 Filed Weights   01/06/21 2037 01/08/21 0500  Weight: 127 kg (!) 147.8 kg    ROS: Review of Systems  Constitutional:  Positive for malaise/fatigue.  Respiratory:  Positive for shortness of breath.   Cardiovascular:  Negative for chest pain.  Gastrointestinal:  Negative for abdominal pain, nausea and vomiting.  Musculoskeletal:  Negative for back pain and neck pain.  Exam: Physical Exam HENT:     Head: Normocephalic.     Mouth/Throat:     Pharynx: No oropharyngeal exudate.  Eyes:     General: Lids are normal.     Conjunctiva/sclera: Conjunctivae normal.     Pupils: Pupils are equal, round, and reactive to light.  Cardiovascular:     Rate and Rhythm: Normal rate and regular rhythm.     Heart sounds: Normal heart sounds, S1 normal and S2 normal.  Pulmonary:     Breath sounds: Examination of the right-lower field reveals decreased breath sounds. Examination of the left-lower field reveals decreased breath sounds. Decreased breath  sounds present. No wheezing, rhonchi or rales.  Abdominal:     Palpations: Abdomen is soft.     Tenderness: There is no abdominal tenderness.  Musculoskeletal:     Right ankle: Swelling present.     Left ankle: Swelling present.  Skin:    General: Skin is warm.     Findings: No rash.  Neurological:     Mental Status: She is alert and oriented to person, place, and time.     Comments: Power 5 out of 5 bilateral upper and lower extremities.  Able to straight leg raise bilaterally.     Data Reviewed: Basic Metabolic Panel: Recent Labs  Lab 01/03/21 1204 01/07/21 0054 01/07/21 0430  NA 138 137 139  K 3.7 3.6 3.7  CL 107 104 105  CO2 25 25 26   GLUCOSE 127* 117* 110*  BUN 16 14 13   CREATININE 0.86 0.90 0.88  CALCIUM 9.1 9.2 8.3*  MG  --   --  2.0  PHOS  --   --  3.3   Liver Function Tests: Recent Labs  Lab 01/07/21 0054 01/07/21 0430  AST 15 13*  ALT 15 13  ALKPHOS 88 76  BILITOT 0.5 0.4  PROT 7.9 6.7  ALBUMIN 3.9 3.4*    CBC: Recent Labs  Lab 01/03/21 1204 01/07/21 0054 01/07/21 0430  WBC 12.7* 16.0* 14.8*  NEUTROABS 11.1* 10.7* 9.7*  HGB 13.6 14.0 12.8  HCT 40.9 42.8  39.4  MCV 82.5 82.9 84.5  PLT 457* 422* 361    BNP (last 3 results) Recent Labs    01/07/21 0054  BNP 8.7     Recent Results (from the past 240 hour(s))  SARS CORONAVIRUS 2 (TAT 6-24 HRS) Nasopharyngeal Nasopharyngeal Swab     Status: Abnormal   Collection Time: 01/03/21 12:04 PM   Specimen: Nasopharyngeal Swab  Result Value Ref Range Status   SARS Coronavirus 2 POSITIVE (A) NEGATIVE Final    Comment: (NOTE) SARS-CoV-2 target nucleic acids are DETECTED.  The SARS-CoV-2 RNA is generally detectable in upper and lower respiratory specimens during the acute phase of infection. Positive results are indicative of the presence of SARS-CoV-2 RNA. Clinical correlation with patient history and other diagnostic information is  necessary to determine patient infection status. Positive  results do not rule out bacterial infection or co-infection with other viruses.  The expected result is Negative.  Fact Sheet for Patients: HairSlick.no  Fact Sheet for Healthcare Providers: quierodirigir.com  This test is not yet approved or cleared by the [place] FDA and  has been authorized for detection and/or diagnosis of SARS-CoV-2 by FDA under an Emergency Use Authorization (EUA). This EUA will remain  in effect (meaning this test can be used) for the duration of the COVID-19 declaration under Section 564(b)(1) of the Act, 21 U. S.C. section 360bbb-3(b)(1), unless the authorization is terminated or revoked sooner.   Performed at [facility], [address], [place], [place] [address]   Group A Strep by PCR     Status: None   Collection Time: 01/03/21 12:04 PM   Specimen: Nasopharyngeal Swab; Sterile Swab  Result Value Ref Range Status   Group A Strep by PCR NOT DETECTED NOT DETECTED Final    Comment: Performed at [facility], [address] [place], [place] [address]  Urine culture     Status: Abnormal   Collection Time: 01/03/21  3:03 PM   Specimen: Urine, Random  Result Value Ref Range Status   Specimen Description   Final    URINE, RANDOM Performed at [facility], [address], [place], [place] [address]    Special Requests   Final    NONE Performed at [facility], [address] [place], [place] [address]    Culture (A)  Final    <10,000 COLONIES/mL INSIGNIFICANT GROWTH Performed at [facility], [address], [place], [place] [address]    Report Status 01/05/2021 FINAL  Final     Studies: CT Head Wo Contrast  Result Date: 01/07/2021 CLINICAL DATA:  Headache EXAM: CT HEAD WITHOUT CONTRAST TECHNIQUE: Contiguous axial images were obtained from the base of the skull through the vertex without intravenous contrast. COMPARISON:  09/13/2019 FINDINGS:  Brain: No acute intracranial abnormality. Specifically, no hemorrhage, hydrocephalus, mass lesion, acute infarction, or significant intracranial injury. Vascular: No hyperdense vessel or unexpected calcification. Skull: No acute calvarial abnormality. Sinuses/Orbits: No acute findings Other: None IMPRESSION: No acute intracranial abnormality. Electronically Signed   By: Charlett Nose M.D.   On: 01/07/2021 01:17   DG Chest Port 1 View  Result Date: 01/07/2021 CLINICAL DATA:  COVID, shortness of breath EXAM: PORTABLE CHEST 1 VIEW COMPARISON:  01/02/2021 FINDINGS: Heart and mediastinal contours are within normal limits. No focal opacities or effusions. No acute bony abnormality. IMPRESSION: No active disease. Electronically Signed   By: Charlett Nose M.D.   On: 01/07/2021 00:59   CT RENAL STONE STUDY  Result Date: 01/07/2021 CLINICAL DATA:  Left flank pain, COVID positive, history of urolithiasis EXAM: CT ABDOMEN AND PELVIS WITHOUT CONTRAST TECHNIQUE: Multidetector CT imaging of the abdomen and pelvis was performed following the standard protocol without IV contrast. COMPARISON:  CT 06/28/2017 FINDINGS: Lower chest: Lung bases are clear. Normal heart size. No pericardial effusion. Hepatobiliary: No worrisome focal liver lesions. Smooth liver surface contour. Normal hepatic attenuation. Pancreas: No pancreatic ductal dilatation or surrounding inflammatory changes. Spleen: Normal in size. No concerning splenic lesions. No concerning adrenal nodules or masses. Adrenals/Urinary Tract: Kidneys are symmetric in size and normally located. No visible or contour deforming renal lesions. No urolithiasis or hydronephrosis. Urinary bladder is largely decompressed at the time of exam and therefore poorly evaluated by CT imaging. No gross bladder abnormality accounting for underdistention. Stomach/Bowel: Sliding-type hiatal hernia. Stomach and duodenum are otherwise. Appendix is not visualized. No focal inflammation the  vicinity of the cecum to suggest an occult appendicitis. No colonic dilatation or wall thickening. No evidence of bowel obstruction. Vascular/Lymphatic: No significant vascular findings are present. No enlarged abdominal or pelvic lymph nodes. Reproductive: Bilateral ligation clips. Uterus and bilateral adnexa are unremarkable. Other: No abdominopelvic free fluid or free gas. No bowel containing hernias. Small fat containing umbilical hernia. Musculoskeletal: No acute or significant osseous findings. IMPRESSION: No acute urinary tract abnormality, specifically no visible urolithiasis or hydronephrosis. No other acute CT abnormality to provide cause for patient's discomfort in the left flank. Small sliding-type hiatal hernia. Electronically Signed   By: Kreg Shropshire M.D.   On: 01/07/2021 15:39    Scheduled Meds:  amLODipine  5 mg Oral Daily   famotidine  20 mg Oral BID   hydrochlorothiazide  12.5 mg Oral Daily   Ipratropium-Albuterol  1 puff Inhalation Q6H WA   levofloxacin  750 mg Oral Daily   methylPREDNISolone (SOLU-MEDROL) injection  40 mg Intravenous Q12H   Continuous Infusions:  remdesivir 100 mg in NS 100 mL 100 mg (01/08/21 1120)   Brief history.  Patient admitted 01/07/2021 with COVID-19 infection and wheezing and weakness and migraine headache.  The patient walking around with physical therapy felt very weak total-body and filed unsafe to go home.  Her blood pressure was very elevated today.  Past medical history of migraines, obesity, kidney stones and fibromyalgia.  Assessment/Plan:  COVID-19 infection with wheezing, weakness admitting physician switched the paxlovid as outpatient to remdesivir and Solu-Medrol.  I will decrease the Solu-Medrol down to 40 mg IV twice daily secondary to blood pressure being very elevated.  Patient with unsteady gait and total body weakness.  We will get an MRI of the brain and MRV of the brain Essential hypertension blood pressure elevated today.  We will  start low-dose Norvasc.  This afternoon still elevated will add Lasix this afternoon with a large gain in BMI. Otitis media.  Patient thinks that the Augmentin is making her itch.  Discontinue Augmentin and switch over to Levaquin. Bilateral flank pain.  CT abdomen renal stone protocol was negative for renal stones.  Urine analysis negative. Headache with history of migraine.  As needed Fioricet.  Magnesium given yesterday.  On Solu-Medrol.  As needed Imitrex.  Can consider Topamax to prevent headache.  MRI of the brain ordered. History of fibromyalgia Morbid obesity with a BMI of 55.93.  With a large jump in BMI, I am concerned that we are not getting accurate weights.  We will give a dose of Lasix today and drop steroid dose down. Itching.  As  needed Benadryl.  Discontinue Augmentin.    Code Status:     Code Status Orders  (From admission, onward)           Start     Ordered   01/07/21 0244  Full code  Continuous        01/07/21 0243           Code Status History     This patient has a current code status but no historical code status.      Disposition Plan: Status is: Inpatient  Dispo: The patient is from: Home              Anticipated d/c is to: Home once doing better              Patient currently still with numerous complaints weakness and elevated blood pressure.  Obtaining MRI of the brain and MRV of the brain for further evaluation.  Starting blood pressure medication today.   Difficult to place patient.  No.  Antibiotics: Levaquin  Time spent: 28 minutes  Alfio Lo[employer]cher [employer]

## 2021-01-09 MED ORDER — LEVOFLOXACIN 750 MG PO TABS
750.0000 mg | ORAL_TABLET | Freq: Every day | ORAL | Status: AC
  Administered 2021-01-09 – 2021-01-11 (×3): 750 mg via ORAL
  Filled 2021-01-09 (×3): qty 1

## 2021-01-09 MED ORDER — METHYLPREDNISOLONE SODIUM SUCC 40 MG IJ SOLR
40.0000 mg | Freq: Every day | INTRAMUSCULAR | Status: DC
  Administered 2021-01-10: 12:00:00 40 mg via INTRAVENOUS
  Filled 2021-01-09: qty 1

## 2021-01-09 MED ORDER — ENOXAPARIN SODIUM 80 MG/0.8ML IJ SOSY
0.5000 mg/kg | PREFILLED_SYRINGE | INTRAMUSCULAR | Status: DC
  Administered 2021-01-09 – 2021-01-10 (×2): 75 mg via SUBCUTANEOUS
  Filled 2021-01-09 (×2): qty 0.75

## 2021-01-09 MED ORDER — BENZONATATE 100 MG PO CAPS
200.0000 mg | ORAL_CAPSULE | Freq: Three times a day (TID) | ORAL | Status: DC
  Administered 2021-01-09 – 2021-01-12 (×10): 200 mg via ORAL
  Filled 2021-01-09 (×9): qty 2

## 2021-01-09 NOTE — TOC Progression Note (Signed)
Transition of Care [facility]) - Progression Note    Patient Details  Name: Toni Robertson MRN: [id number] Date of Birth: April 08, 1986  [facility]) CM/SW Contact  Caryn Section, RN Phone Number: 01/09/2021, 12:36 PM  Clinical Narrative:   Patient lives with her 35 yr old son   She states her family would help if needed, but they are afraid of COVID diagnosis.  She is working with her employer to work from home once she is discharged.    She has no concerns getting to appointments or getting medications and she can take medications as prescribed.  Patient is fearful of falls when walking.  Recommendation is for Home Health Physical Therapy.   RNCM will search for Home Health that accepts patient insurance and communicate plans with patient.  TOC contact information given, TOC to follow to discharge.        Expected Discharge Plan and Services                                                 Social Determinants of Health (SDOH) Interventions    Readmission Risk Interventions No flowsheet data found.

## 2021-01-09 NOTE — Progress Notes (Signed)
Physical Therapy Treatment Patient Details Name: Toni Robertson MRN: [id number] DOB: [birth year] Today's Date: 01/09/2021    History of Present Illness Toni Robertson is a 35yoF who comes to [facility] on 01/06/21 after 3d HA at home. Pt (+) COVID19 01/03/21. Pt reports severe generalized weakness, presyncopal prodrome in when AMB to BR with RN;currently satting on room air. Pt reports hqaving Covid in 2020 with less severeity of symptoms.    PT Comments    Pt in bed upon arrival, pressures within safe range. Pt agreeable to AMB training with RW despite still feeling shaky at times. Pt able to AMB to door and back twice with RW, very slow @ 0.43m/s, in addition to frequent standing rest intervals to focus on calm breathing. Pt voices concern with falling due to shakiness, but denies frank imbalance. Pt displays no frank LOB is session. Author mostly provides safe environment modification in room, removing obstacles and driving IV pole. Pt AMB to BR after gait training, then back to bed. Will continue to follow. Discussed with RN transition to pocket tele monitor to promote independent AMB in room.   Follow Up Recommendations  Home health PT     Equipment Recommendations  Rolling walker with 5" wheels    Recommendations for Other Services       Precautions / Restrictions Precautions Precautions: None Restrictions Weight Bearing Restrictions: No    Mobility  Bed Mobility Overal bed mobility: Modified Independent                  Transfers Overall transfer level: Modified independent Equipment used: None Transfers: Sit to/from Stand              Ambulation/Gait Ambulation/Gait assistance: Supervision Gait Distance (Feet): 64 Feet (then 64ft) Assistive device: Rolling walker (2 wheeled)   Gait velocity: 0.46m/s   General Gait Details: very slow, pauses frequently trying to focus on breathing and prevent additional tightness in chest during AMB   Stairs              Wheelchair Mobility    Modified Rankin (Stroke Patients Only)       Balance                                            Cognition Arousal/Alertness: Awake/alert Behavior During Therapy: WFL for tasks assessed/performed;Anxious Overall Cognitive Status: Within Functional Limits for tasks assessed                                        Exercises      General Comments        Pertinent Vitals/Pain Pain Assessment: No/denies pain    Home Living                      Prior Function            PT Goals (current goals can now be found in the care plan section) Acute Rehab PT Goals Patient Stated Goal: regain independence in ADL PT Goal Formulation: With patient Time For Goal Achievement: 01/22/21 Potential to Achieve Goals: Good Progress towards PT goals: Progressing toward goals    Frequency    Min 2X/week      PT Plan Current plan remains appropriate    Co-evaluation  AM-PAC PT "6 Clicks" Mobility   Outcome Measure  Help needed turning from your back to your side while in a flat bed without using bedrails?: None Help needed moving from lying on your back to sitting on the side of a flat bed without using bedrails?: None Help needed moving to and from a bed to a chair (including a wheelchair)?: None Help needed standing up from a chair using your arms (e.g., wheelchair or bedside chair)?: None Help needed to walk in hospital room?: A Little Help needed climbing 3-5 steps with a railing? : A Little 6 Click Score: 22    End of Session   Activity Tolerance: Patient tolerated treatment well;No increased pain Patient left: with call bell/phone within reach;in bed Nurse Communication: Mobility status PT Visit Diagnosis: Difficulty in walking, not elsewhere classified (R26.2);Other abnormalities of gait and mobility (R26.89);Muscle weakness (generalized) (M62.81)     Time: 1683-7290 PT Time  Calculation (min) (ACUTE ONLY): 32 min  Charges:  $Therapeutic Activity: 23-37 mins                    3:06 PM, 01/09/21 Rosamaria Lints, PT, DPT Physical Therapist - [facility]  [phone] (ASCOM)     Kalix Meinecke C 01/09/2021, 3:02 PM

## 2021-01-09 NOTE — Progress Notes (Signed)
PROGRESS NOTE    Toni Robertson  ZOX:[id number] DOB: [birth year] DOA: 01/06/2021 PCP: [facility]    Brief Narrative:  Patient admitted 01/07/2021 with COVID-19 infection and wheezing and weakness and migraine headache.  The patient walking around with physical therapy felt very weak total-body and filed unsafe to go home.  From a COVID standpoint she is minimally symptomatic.  No radiographic evidence of pneumonia.  No fevers.  No oxygen requirement.  Patient's main symptom has been marked weakness preventing her from ambulating safely.   Assessment & Plan:   Principal Problem:   COVID-19 virus infection Active Problems:   Generalized weakness   Cough   Leukocytosis   Acute intractable headache   GERD (gastroesophageal reflux disease)   Mild intermittent asthma   Morbid obesity with BMI of 50.0-59.9, adult (HCC)   Essential hypertension  COVID-19 infection Patient is minimally symptomatic She endorses cough and wheezing No pneumonia on chest x-ray No fevers or oxygen requirement MRI imaging survey negative Plan: Decree Solu-Medrol 40 mg daily DC remdesivir, unlikely to be of much benefit at this time Supportive care Isolation  Essential hypertension Blood pressure markedly elevated on 6/28 Possibly due to underlying stress and anxiety about situation Plan: Continue Lasix and Norvasc started 6/28  Otitis media Initially started on Augmentin however patient endorsed allergy Tolerating Levaquin Continue Levaquin, limit course to 5 days maximum  Bilateral flank pain Patient attributes this to the COVID medication which I discontinued CT abdomen renal stone study negative Urinalysis negative  History of migraine As needed Fioricet As needed Imitrex Daily Topamax  History of fibromyalgia  Morbid obesity BMI 55.93 This complicates overall care and prognosis    DVT prophylaxis: SQ Lovenox Code Status: Full Family Communication: None  today Disposition Plan: Status is: Inpatient  Remains inpatient appropriate because:Inpatient level of care appropriate due to severity of illness  Dispo: The patient is from: Home              Anticipated d/c is to: Home              Patient currently is not medically stable to d/c.   Difficult to place patient No       Level of care: Med-Surg  Consultants:  None  Procedures:  None  Antimicrobials: Levaquin   Subjective: Patient seen and examined.  Continues to be very upset about residual weakness and unsteadiness on her feet.  Objective: Vitals:   01/08/21 2356 01/09/21 0402 01/09/21 0843 01/09/21 1142  BP: 126/71 130/73 (!) 114/93 (!) 168/96  Pulse: 86 72  97  Resp: 18 16 18 20   Temp: 98.6 F (37 C) 98 F (36.7 C) 98.1 F (36.7 C) 97.9 F (36.6 C)  TempSrc:   Oral Oral  SpO2: 100% 100% 99% 97%  Weight:      Height:        Intake/Output Summary (Last 24 hours) at 01/09/2021 1630 Last data filed at 01/08/2021 2200 Gross per 24 hour  Intake 340 ml  Output --  Net 340 ml   Filed Weights   01/06/21 2037 01/08/21 0500  Weight: 127 kg (!) 147.8 kg    Examination:  General exam: No acute distress Respiratory system: Clear to auscultation. Respiratory effort normal. Cardiovascular system: S1 & S2 heard, RRR. No JVD, murmurs, rubs, gallops or clicks. No pedal edema. Gastrointestinal system: Soft, nontender, nondistended, normal bowel sounds Central nervous system: Alert and oriented. No focal neurological deficits. Extremities: Symmetric 5 x 5 power. Skin:  No rashes, lesions or ulcers Psychiatry: Judgement and insight appear normal. Mood & affect appropriate.     Data Reviewed: I have personally reviewed following labs and imaging studies  CBC: Recent Labs  Lab 01/03/21 1204 01/07/21 0054 01/07/21 0430  WBC 12.7* 16.0* 14.8*  NEUTROABS 11.1* 10.7* 9.7*  HGB 13.6 14.0 12.8  HCT 40.9 42.8 39.4  MCV 82.5 82.9 84.5  PLT 457* 422* 361   Basic  Metabolic Panel: Recent Labs  Lab 01/03/21 1204 01/07/21 0054 01/07/21 0430  NA 138 137 139  K 3.7 3.6 3.7  CL 107 104 105  CO2 25 25 26   GLUCOSE 127* 117* 110*  BUN 16 14 13   CREATININE 0.86 0.90 0.88  CALCIUM 9.1 9.2 8.3*  MG  --   --  2.0  PHOS  --   --  3.3   GFR: Estimated Creatinine Clearance: 129.5 mL/min (by C-G formula based on SCr of 0.88 mg/dL). Liver Function Tests: Recent Labs  Lab 01/07/21 0054 01/07/21 0430  AST 15 13*  ALT 15 13  ALKPHOS 88 76  BILITOT 0.5 0.4  PROT 7.9 6.7  ALBUMIN 3.9 3.4*   No results for input(s): LIPASE, AMYLASE in the last 168 hours. No results for input(s): AMMONIA in the last 168 hours. Coagulation Profile: No results for input(s): INR, PROTIME in the last 168 hours. Cardiac Enzymes: No results for input(s): CKTOTAL, CKMB, CKMBINDEX, TROPONINI in the last 168 hours. BNP (last 3 results) No results for input(s): PROBNP in the last 8760 hours. HbA1C: No results for input(s): HGBA1C in the last 72 hours. CBG: No results for input(s): GLUCAP in the last 168 hours. Lipid Profile: No results for input(s): CHOL, HDL, LDLCALC, TRIG, CHOLHDL, LDLDIRECT in the last 72 hours. Thyroid Function Tests: Recent Labs    01/07/21 0430  TSH 1.406   Anemia Panel: Recent Labs    01/07/21 0106 01/07/21 0430  FERRITIN 119 108   Sepsis Labs: Recent Labs  Lab 01/07/21 0106  PROCALCITON <0.10    Recent Results (from the past 240 hour(s))  SARS CORONAVIRUS 2 (TAT 6-24 HRS) Nasopharyngeal Nasopharyngeal Swab     Status: Abnormal   Collection Time: 01/03/21 12:04 PM   Specimen: Nasopharyngeal Swab  Result Value Ref Range Status   SARS Coronavirus 2 POSITIVE (A) NEGATIVE Final    Comment: (NOTE) SARS-CoV-2 target nucleic acids are DETECTED.  The SARS-CoV-2 RNA is generally detectable in upper and lower respiratory specimens during the acute phase of infection. Positive results are indicative of the presence of SARS-CoV-2 RNA.  Clinical correlation with patient history and other diagnostic information is  necessary to determine patient infection status. Positive results do not rule out bacterial infection or co-infection with other viruses.  The expected result is Negative.  Fact Sheet for Patients: 01/09/21  Fact Sheet for Healthcare Providers: 01/05/21  This test is not yet approved or cleared by the [place] FDA and  has been authorized for detection and/or diagnosis of SARS-CoV-2 by FDA under an Emergency Use Authorization (EUA). This EUA will remain  in effect (meaning this test can be used) for the duration of the COVID-19 declaration under Section 564(b)(1) of the Act, 21 U. S.C. section 360bbb-3(b)(1), unless the authorization is terminated or revoked sooner.   Performed at [facility], [address] [place], [address] [place]   Group A Strep by PCR     Status: None   Collection Time: 01/03/21 12:04 PM   Specimen: Nasopharyngeal Swab;  Sterile Swab  Result Value Ref Range Status   Group A Strep by PCR NOT DETECTED NOT DETECTED Final    Comment: Performed at [facility], [address] [place], [place] [address]  Urine culture     Status: Abnormal   Collection Time: 01/03/21  3:03 PM   Specimen: Urine, Random  Result Value Ref Range Status   Specimen Description   Final    URINE, RANDOM Performed at [facility], [address] [place], [place] [address]    Special Requests   Final    NONE Performed at [facility], [address] [place], [place] [address]    Culture (A)  Final    <10,000 COLONIES/mL INSIGNIFICANT GROWTH Performed at [facility], [address] [place], [place] [address]    Report Status 01/05/2021 FINAL  Final         Radiology Studies: MR BRAIN WO CONTRAST  Result Date: 01/08/2021 CLINICAL DATA:  Neuro deficit, acute, stroke suspected.  EXAM: MRI HEAD WITHOUT AND WITH CONTRAST MR VENOGRAM HEAD WITHOUT AND WITH CONTRAST TECHNIQUE: Multiplanar, multi-echo pulse sequences of the brain and surrounding structures were acquired without and with intravenous contrast. Angiographic images of the intracranial venous structures were acquired using MRV technique without and with intravenous contrast. CONTRAST:  10mL GADAVIST GADOBUTROL 1 MMOL/ML IV SOLN COMPARISON:  Head CT January 07, 2021 FINDINGS: MRI HEAD WITHOUT AND WITH CONTRAST Brain: No acute infarction, hemorrhage, hydrocephalus, extra-axial collection or mass lesion. The brain parenchyma has normal morphology and signal characteristics. No pathologic intracranial enhancement. Vascular: Normal flow voids. Skull and upper cervical spine: No focal marrow lesion. Sinuses/Orbits: No acute or significant finding. MR VENOGRAM HEAD WITHOUT AND WITH CONTRAST There is no evidence of dural venous sinus or deep cerebral vein thrombosis. No dural venous sinus stenosis. IMPRESSION: 1. No acute intracranial abnormality. 2. Unremarkable MRI and MR venogram of the brain. Electronically Signed   By: Baldemar LenisKatyucia  De Macedo Rodrigues M.D.   On: 01/08/2021 16:37   MR MRV HEAD W WO CONTRAST  Result Date: 01/08/2021 CLINICAL DATA:  Neuro deficit, acute, stroke suspected. EXAM: MRI HEAD WITHOUT AND WITH CONTRAST MR VENOGRAM HEAD WITHOUT AND WITH CONTRAST TECHNIQUE: Multiplanar, multi-echo pulse sequences of the brain and surrounding structures were acquired without and with intravenous contrast. Angiographic images of the intracranial venous structures were acquired using MRV technique without and with intravenous contrast. CONTRAST:  10mL GADAVIST GADOBUTROL 1 MMOL/ML IV SOLN COMPARISON:  Head CT January 07, 2021 FINDINGS: MRI HEAD WITHOUT AND WITH CONTRAST Brain: No acute infarction, hemorrhage, hydrocephalus, extra-axial collection or mass lesion. The brain parenchyma has normal morphology and signal characteristics. No  pathologic intracranial enhancement. Vascular: Normal flow voids. Skull and upper cervical spine: No focal marrow lesion. Sinuses/Orbits: No acute or significant finding. MR VENOGRAM HEAD WITHOUT AND WITH CONTRAST There is no evidence of dural venous sinus or deep cerebral vein thrombosis. No dural venous sinus stenosis. IMPRESSION: 1. No acute intracranial abnormality. 2. Unremarkable MRI and MR venogram of the brain. Electronically Signed   By: Baldemar LenisKatyucia  De Macedo Rodrigues M.D.   On: 01/08/2021 16:37        Scheduled Meds:  amLODipine  5 mg Oral Daily   benzonatate  200 mg Oral TID   enoxaparin (LOVENOX) injection  0.5 mg/kg Subcutaneous Q24H   famotidine  20 mg Oral BID   furosemide  40 mg Oral Daily   Ipratropium-Albuterol  1 puff Inhalation Q6H WA   levofloxacin  750 mg Oral Daily   [START ON 01/10/2021] methylPREDNISolone (SOLU-MEDROL) injection  40 mg Intravenous Daily   topiramate  25 mg Oral QHS   Continuous Infusions:   LOS: 2 days    Time spent: 25 minutes    Tresa Moore, MD [facility] Pager [phone]  If 7PM-7AM, please contact night-coverage 01/09/2021, 4:30 PM

## 2021-01-10 MED ORDER — SODIUM CHLORIDE 0.9 % IV SOLN
12.5000 mg | Freq: Four times a day (QID) | INTRAVENOUS | Status: DC | PRN
  Filled 2021-01-10: qty 0.5

## 2021-01-10 NOTE — TOC Progression Note (Signed)
Transition of Care [facility]) - Progression Note    Patient Details  Name: Toni Robertson MRN: [id number] Date of Birth: 1985/10/12  [facility] CM/SW Contact  Caryn Section, RN Phone Number: 01/10/2021, 10:16 AM  Clinical Narrative:   [facility] can provide Home Health PT for patient upon discharge,  as per [place].         Expected Discharge Plan and Services                                                 Social Determinants of Health (SDOH) Interventions    Readmission Risk Interventions No flowsheet data found.

## 2021-01-10 NOTE — Progress Notes (Signed)
Pt was ambulate to the BR and back in bed. Pt stated that her legs still feel weak and shaky. Pt also gets winded on ambulation. Oxygen saturation is 95% on RA once she returns to bed. Will monitor.

## 2021-01-10 NOTE — Progress Notes (Signed)
PROGRESS NOTE    Toni Robertson  IRC:[id number] DOB: [birth year] DOA: 01/06/2021 PCP: [facility]    Brief Narrative:  Patient admitted 01/07/2021 with COVID-19 infection and wheezing and weakness and migraine headache.  The patient walking around with physical therapy felt very weak total-body and filed unsafe to go home.  From a COVID standpoint she is minimally symptomatic.  No radiographic evidence of pneumonia.  No fevers.  No oxygen requirement.  Patient's main symptom has been marked weakness preventing her from ambulating safely.   Assessment & Plan:   Principal Problem:   COVID-19 virus infection Active Problems:   Generalized weakness   Cough   Leukocytosis   Acute intractable headache   GERD (gastroesophageal reflux disease)   Mild intermittent asthma   Morbid obesity with BMI of 50.0-59.9, adult (HCC)   Essential hypertension  COVID-19 infection Patient is minimally symptomatic She endorses cough and wheezing No pneumonia on chest x-ray No fevers or oxygen requirement MRI imaging survey negative Plan: Continue Solu-Medrol 40 mg daily today Start p.o. prednisone tomorrow Continue respiratory isolation for COVID Supportive care  Essential hypertension Blood pressure markedly elevated on 6/28 Possibly due to underlying stress and anxiety about situation Plan: Continue Lasix and Norvasc started 6/28  Otitis media Initially started on Augmentin however patient endorsed allergy Tolerating Levaquin Continue Levaquin, limit course to 5 days maximum And date set  Bilateral flank pain Patient attributes this to the COVID medication which I discontinued CT abdomen renal stone study negative Urinalysis negative  History of migraine As needed Fioricet As needed Imitrex Daily Topamax  History of fibromyalgia  Morbid obesity BMI 55.93 This complicates overall care and prognosis    DVT prophylaxis: SQ Lovenox Code Status: Full Family  Communication: None today Disposition Plan: Status is: Inpatient  Remains inpatient appropriate because:Inpatient level of care appropriate due to severity of illness  Dispo: The patient is from: Home              Anticipated d/c is to: Home              Patient currently is not medically stable to d/c.   Difficult to place patient No       Level of care: Med-Surg  Consultants:  None  Procedures:  None  Antimicrobials: Levaquin   Subjective: Patient seen and examined.  Continues to endorse significant weakness and unsteadiness on the feet associated with intermittent migraine headache and nausea  Objective: Vitals:   01/09/21 2337 01/10/21 0537 01/10/21 0917 01/10/21 1213  BP: 137/89 (!) 143/98 107/75 128/86  Pulse: 87 84 91 87  Resp: 18 20 18 17   Temp: 98.5 F (36.9 C) 98.1 F (36.7 C) 98.6 F (37 C) (!) 97.4 F (36.3 C)  TempSrc:   Oral Oral  SpO2: 100% 100% 100% 100%  Weight:      Height:       No intake or output data in the 24 hours ending 01/10/21 1223  Filed Weights   01/06/21 2037 01/08/21 0500  Weight: 127 kg (!) 147.8 kg    Examination:  General exam: No acute distress Respiratory system: Clear to auscultation. Respiratory effort normal. Cardiovascular system: S1 & S2 heard, RRR. No JVD, murmurs, rubs, gallops or clicks. No pedal edema. Gastrointestinal system: Soft, nontender, nondistended, normal bowel sounds Central nervous system: Alert and oriented. No focal neurological deficits. Extremities: Symmetric 5 x 5 power. Skin: No rashes, lesions or ulcers Psychiatry: Judgement and insight appear normal. Mood & affect  appropriate.     Data Reviewed: I have personally reviewed following labs and imaging studies  CBC: Recent Labs  Lab 01/07/21 0054 01/07/21 0430  WBC 16.0* 14.8*  NEUTROABS 10.7* 9.7*  HGB 14.0 12.8  HCT 42.8 39.4  MCV 82.9 84.5  PLT 422* 361   Basic Metabolic Panel: Recent Labs  Lab 01/07/21 0054 01/07/21 0430   NA 137 139  K 3.6 3.7  CL 104 105  CO2 25 26  GLUCOSE 117* 110*  BUN 14 13  CREATININE 0.90 0.88  CALCIUM 9.2 8.3*  MG  --  2.0  PHOS  --  3.3   GFR: Estimated Creatinine Clearance: 129.5 mL/min (by C-G formula based on SCr of 0.88 mg/dL). Liver Function Tests: Recent Labs  Lab 01/07/21 0054 01/07/21 0430  AST 15 13*  ALT 15 13  ALKPHOS 88 76  BILITOT 0.5 0.4  PROT 7.9 6.7  ALBUMIN 3.9 3.4*   No results for input(s): LIPASE, AMYLASE in the last 168 hours. No results for input(s): AMMONIA in the last 168 hours. Coagulation Profile: No results for input(s): INR, PROTIME in the last 168 hours. Cardiac Enzymes: No results for input(s): CKTOTAL, CKMB, CKMBINDEX, TROPONINI in the last 168 hours. BNP (last 3 results) No results for input(s): PROBNP in the last 8760 hours. HbA1C: No results for input(s): HGBA1C in the last 72 hours. CBG: No results for input(s): GLUCAP in the last 168 hours. Lipid Profile: No results for input(s): CHOL, HDL, LDLCALC, TRIG, CHOLHDL, LDLDIRECT in the last 72 hours. Thyroid Function Tests: No results for input(s): TSH, T4TOTAL, FREET4, T3FREE, THYROIDAB in the last 72 hours.  Anemia Panel: No results for input(s): VITAMINB12, FOLATE, FERRITIN, TIBC, IRON, RETICCTPCT in the last 72 hours.  Sepsis Labs: Recent Labs  Lab 01/07/21 0106  PROCALCITON <0.10    Recent Results (from the past 240 hour(s))  SARS CORONAVIRUS 2 (TAT 6-24 HRS) Nasopharyngeal Nasopharyngeal Swab     Status: Abnormal   Collection Time: 01/03/21 12:04 PM   Specimen: Nasopharyngeal Swab  Result Value Ref Range Status   SARS Coronavirus 2 POSITIVE (A) NEGATIVE Final    Comment: (NOTE) SARS-CoV-2 target nucleic acids are DETECTED.  The SARS-CoV-2 RNA is generally detectable in upper and lower respiratory specimens during the acute phase of infection. Positive results are indicative of the presence of SARS-CoV-2 RNA. Clinical correlation with patient history and  other diagnostic information is  necessary to determine patient infection status. Positive results do not rule out bacterial infection or co-infection with other viruses.  The expected result is Negative.  Fact Sheet for Patients: HairSlick.no  Fact Sheet for Healthcare Providers: quierodirigir.com  This test is not yet approved or cleared by the [place] FDA and  has been authorized for detection and/or diagnosis of SARS-CoV-2 by FDA under an Emergency Use Authorization (EUA). This EUA will remain  in effect (meaning this test can be used) for the duration of the COVID-19 declaration under Section 564(b)(1) of the Act, 21 U. S.C. section 360bbb-3(b)(1), unless the authorization is terminated or revoked sooner.   Performed at [facility], [address] [place], [place] [address]   Group A Strep by PCR     Status: None   Collection Time: 01/03/21 12:04 PM   Specimen: Nasopharyngeal Swab; Sterile Swab  Result Value Ref Range Status   Group A Strep by PCR NOT DETECTED NOT DETECTED Final    Comment: Performed at [facility], [address] [place],  [place] [address]  Urine culture     Status: Abnormal   Collection Time: 01/03/21  3:03 PM   Specimen: Urine, Random  Result Value Ref Range Status   Specimen Description   Final    URINE, RANDOM Performed at [facility], [address] [place], [place] [address]    Special Requests   Final    NONE Performed at [facility], [address] [place], [place] [address]    Culture (A)  Final    <10,000 COLONIES/mL INSIGNIFICANT GROWTH Performed at [facility], [address] [place], [place] [address]    Report Status 01/05/2021 FINAL  Final         Radiology Studies: MR BRAIN WO CONTRAST  Result Date: 01/08/2021 CLINICAL DATA:  Neuro deficit, acute, stroke suspected. EXAM: MRI HEAD WITHOUT AND WITH CONTRAST MR  VENOGRAM HEAD WITHOUT AND WITH CONTRAST TECHNIQUE: Multiplanar, multi-echo pulse sequences of the brain and surrounding structures were acquired without and with intravenous contrast. Angiographic images of the intracranial venous structures were acquired using MRV technique without and with intravenous contrast. CONTRAST:  10mL GADAVIST GADOBUTROL 1 MMOL/ML IV SOLN COMPARISON:  Head CT January 07, 2021 FINDINGS: MRI HEAD WITHOUT AND WITH CONTRAST Brain: No acute infarction, hemorrhage, hydrocephalus, extra-axial collection or mass lesion. The brain parenchyma has normal morphology and signal characteristics. No pathologic intracranial enhancement. Vascular: Normal flow voids. Skull and upper cervical spine: No focal marrow lesion. Sinuses/Orbits: No acute or significant finding. MR VENOGRAM HEAD WITHOUT AND WITH CONTRAST There is no evidence of dural venous sinus or deep cerebral vein thrombosis. No dural venous sinus stenosis. IMPRESSION: 1. No acute intracranial abnormality. 2. Unremarkable MRI and MR venogram of the brain. Electronically Signed   By: Baldemar LenisKatyucia  De [place]o Rodrigues M.D.   On: 01/08/2021 16:37   MR MRV HEAD W WO CONTRAST  Result Date: 01/08/2021 CLINICAL DATA:  Neuro deficit, acute, stroke suspected. EXAM: MRI HEAD WITHOUT AND WITH CONTRAST MR VENOGRAM HEAD WITHOUT AND WITH CONTRAST TECHNIQUE: Multiplanar, multi-echo pulse sequences of the brain and surrounding structures were acquired without and with intravenous contrast. Angiographic images of the intracranial venous structures were acquired using MRV technique without and with intravenous contrast. CONTRAST:  10mL GADAVIST GADOBUTROL 1 MMOL/ML IV SOLN COMPARISON:  Head CT January 07, 2021 FINDINGS: MRI HEAD WITHOUT AND WITH CONTRAST Brain: No acute infarction, hemorrhage, hydrocephalus, extra-axial collection or mass lesion. The brain parenchyma has normal morphology and signal characteristics. No pathologic intracranial enhancement. Vascular:  Normal flow voids. Skull and upper cervical spine: No focal marrow lesion. Sinuses/Orbits: No acute or significant finding. MR VENOGRAM HEAD WITHOUT AND WITH CONTRAST There is no evidence of dural venous sinus or deep cerebral vein thrombosis. No dural venous sinus stenosis. IMPRESSION: 1. No acute intracranial abnormality. 2. Unremarkable MRI and MR venogram of the brain. Electronically Signed   By: Baldemar LenisKatyucia  De [place]o Rodrigues M.D.   On: 01/08/2021 16:37        Scheduled Meds:  amLODipine  5 mg Oral Daily   benzonatate  200 mg Oral TID   enoxaparin (LOVENOX) injection  0.5 mg/kg Subcutaneous Q24H   famotidine  20 mg Oral BID   furosemide  40 mg Oral Daily   Ipratropium-Albuterol  1 puff Inhalation Q6H WA   levofloxacin  750 mg Oral Daily   methylPREDNISolone (SOLU-MEDROL) injection  40 mg Intravenous Daily   topiramate  25 mg Oral QHS   Continuous Infusions:  promethazine (PHENERGAN) injection (IM or IVPB)  LOS: 3 days    Time spent: 15 minutes    Tresa Moore, MD [facility] Pager [phone]  If 7PM-7AM, please contact night-coverage 01/10/2021, 12:23 PM

## 2021-01-11 LAB — CREATININE, SERUM
Creatinine, Ser: 1.1 mg/dL — ABNORMAL HIGH (ref 0.44–1.00)
GFR, Estimated: >60 mL/min (ref >60)

## 2021-01-11 LAB — CBC
HCT: 41 % (ref 36.0–46.0)
Hemoglobin: 13.6 g/dL (ref 12.0–15.0)
MCH: 27.8 pg (ref 26.0–34.0)
MCHC: 33.2 g/dL (ref 30.0–36.0)
MCV: 83.7 fL (ref 80.0–100.0)
Platelets: 354 10*3/uL (ref 150–400)
RBC: 4.9 MIL/uL (ref 3.87–5.11)
RDW: 14.4 % (ref 11.5–15.5)
WBC: 16.2 10*3/uL — ABNORMAL HIGH (ref 4.0–10.5)
nRBC: 0 % (ref 0.0–0.2)

## 2021-01-11 MED ORDER — HYDROCOD POLST-CPM POLST ER 10-8 MG/5ML PO SUER
5.0000 mL | Freq: Two times a day (BID) | ORAL | Status: DC | PRN
  Administered 2021-01-11 – 2021-01-12 (×2): 5 mL via ORAL
  Filled 2021-01-11 (×2): qty 5

## 2021-01-11 MED ORDER — PREDNISONE 20 MG PO TABS
40.0000 mg | ORAL_TABLET | Freq: Every day | ORAL | Status: DC
  Administered 2021-01-11 – 2021-01-12 (×2): 40 mg via ORAL
  Filled 2021-01-11 (×2): qty 2

## 2021-01-11 MED ORDER — ENSURE ENLIVE PO LIQD
237.0000 mL | Freq: Two times a day (BID) | ORAL | Status: DC
  Administered 2021-01-11 (×2): 237 mL via ORAL

## 2021-01-11 MED ORDER — OXYCODONE HCL 5 MG PO TABS
5.0000 mg | ORAL_TABLET | ORAL | Status: DC | PRN
  Administered 2021-01-11 – 2021-01-12 (×3): 5 mg via ORAL
  Filled 2021-01-11 (×3): qty 1

## 2021-01-11 MED ORDER — ONDANSETRON HCL 4 MG PO TABS
4.0000 mg | ORAL_TABLET | Freq: Three times a day (TID) | ORAL | Status: DC | PRN
  Administered 2021-01-11 – 2021-01-12 (×4): 4 mg via ORAL
  Filled 2021-01-11 (×4): qty 1

## 2021-01-11 NOTE — Progress Notes (Addendum)
Physical Therapy Treatment Patient Details Name: Toni Robertson MRN: [id number] DOB: December 27, 1985 Today's Date: 01/11/2021    History of Present Illness Toni Robertson is a 35yoF who [facility] after 3d HA at home. Pt (+) COVID19 01/03/21. Pt reports severe generalized weakness, presyncopal prodrome in when AMB to BR with RN;currently satting on room air. Pt reports hqaving Covid in 2020 with less severeity of symptoms.    PT Comments    Pt in bed upon entry, reports continues to have easily exacerbated anxiety and even one questionable panic attack earlier today, has to really focus on calming self when mobilizing, remains anxiously concerned about her current capabilities and activity tolerance. In light of this pt is still proud to remark on observation of slight improvements in strength, mobility, and breathing. Pt taken through a trial HEP, tolerated well in general, educated on independence performance 3x/daily as tolerated. Pt texted a link with HEP handout, confirmed receipt and access prior to exit. Pt asks about ability to receive HHPT, still concerned about access to services and cost- Thereasa Parkin remarked that she likely could just as easily consider OPPT services instead if more convenient, however more details on quarantine timeline will be needed from MD.    Follow Up Recommendations  Outpatient PT     Equipment Recommendations  Rolling walker with 5" wheels    Recommendations for Other Services       Precautions / Restrictions Precautions Precautions: None Restrictions Weight Bearing Restrictions: No    Mobility  Bed Mobility Overal bed mobility: Modified Independent                  Transfers Overall transfer level: Modified independent Equipment used: Rolling walker (2 wheeled) Transfers: Sit to/from Stand              Ambulation/Gait Ambulation/Gait assistance:  (AMB ad lib with RW, also with NSG)               Stairs              Wheelchair Mobility    Modified Rankin (Stroke Patients Only)       Balance Overall balance assessment: Modified Independent (tolerates multiple hands-free standing bouts wth RW at side)                                          Cognition Arousal/Alertness: Awake/alert Behavior During Therapy: WFL for tasks assessed/performed;Anxious Overall Cognitive Status: Within Functional Limits for tasks assessed                                        Exercises Other Exercises Other Exercises: STS from EOB RW prn x15 (issued in HEP TID) Other Exercises: Seated EOB LAQ 1x10 bilat (issued in HEP TID) Other Exercises: Seated EOB Marching 1x15 bilat (issued in HEP TID) Other Exercises: Seated EOB heel raises 1x10 (cued 20, but unable due to fatigue) (issued in HEP TID)    General Comments        Pertinent Vitals/Pain Pain Assessment: No/denies pain    Home Living                      Prior Function            PT Goals (current goals  can now be found in the care plan section) Acute Rehab PT Goals Patient Stated Goal: regain independence in ADL PT Goal Formulation: With patient Time For Goal Achievement: 01/22/21 Potential to Achieve Goals: Good Progress towards PT goals: Progressing toward goals    Frequency    Min 2X/week      PT Plan Current plan remains appropriate;Discharge plan needs to be updated    Co-evaluation              AM-PAC PT "6 Clicks" Mobility   Outcome Measure  Help needed turning from your back to your side while in a flat bed without using bedrails?: None Help needed moving from lying on your back to sitting on the side of a flat bed without using bedrails?: None Help needed moving to and from a bed to a chair (including a wheelchair)?: None Help needed standing up from a chair using your arms (e.g., wheelchair or bedside chair)?: None Help needed to walk in hospital room?: None Help needed  climbing 3-5 steps with a railing? : A Little 6 Click Score: 23    End of Session   Activity Tolerance: Patient tolerated treatment well;Patient limited by fatigue Patient left: with call bell/phone within reach;in bed Nurse Communication: Mobility status PT Visit Diagnosis: Difficulty in walking, not elsewhere classified (R26.2);Other abnormalities of gait and mobility (R26.89);Muscle weakness (generalized) (M62.81)     Time: 1130-1200 PT Time Calculation (min) (ACUTE ONLY): 30 min  Charges:  $Therapeutic Exercise: 8-22 mins $Self Care/Home Management: 8-22                    4:34 PM, 01/11/21 Rosamaria Lints, PT, DPT Physical Therapist - [facility]  [phone] (ASCOM)     Gildardo Tickner C 01/11/2021, 4:30 PM

## 2021-01-11 NOTE — Progress Notes (Signed)
PROGRESS NOTE    Toni Robertson  TGG:[id number] DOB: 1985-08-04 DOA: 01/06/2021 [facility]: [facility]    Brief Narrative:  Patient admitted 01/07/2021 with COVID-19 infection and wheezing and weakness and migraine headache.  The patient walking around with physical therapy felt very weak total-body and filed unsafe to go home.  From a COVID standpoint she is minimally symptomatic.  No radiographic evidence of pneumonia.  No fevers.  No oxygen requirement.  Patient's main symptom has been marked weakness preventing her from ambulating safely.   Assessment & Plan:   Principal Problem:   COVID-19 virus infection Active Problems:   Generalized weakness   Cough   Leukocytosis   Acute intractable headache   GERD (gastroesophageal reflux disease)   Mild intermittent asthma   Morbid obesity with BMI of 50.0-59.9, adult (HCC)   Essential hypertension  COVID-19 infection Patient is minimally symptomatic She endorses cough and wheezing No pneumonia on chest x-ray No fevers or oxygen requirement MRI imaging survey negative Plan: DC IV Solu-Medrol Start p.o. prednisone 40 mg a day Will plan for 5-day course Continue respiratory isolation for COVID Supportive care Anticipated discharge 7/2  Essential hypertension Blood pressure markedly elevated on 6/28 Possibly due to underlying stress and anxiety about situation Plan: Continue Lasix and Norvasc started 6/28 Likely will not need antihypertensives on discharge Will stop and follow-up with PCP  Otitis media Initially started on Augmentin however patient endorsed allergy Tolerating Levaquin Levaquin for 5-day course  Bilateral flank pain Patient attributes this to the COVID medication which I discontinued CT abdomen renal stone study negative Urinalysis negative  History of migraine As needed Fioricet As needed Imitrex Daily Topamax  History of fibromyalgia  Morbid obesity BMI 55.93 This complicates overall  care and prognosis    DVT prophylaxis: SQ Lovenox Code Status: Full Family Communication: None today Disposition Plan: Status is: Inpatient  Remains inpatient appropriate because:Inpatient level of care appropriate due to severity of illness  Dispo: The patient is from: Home              Anticipated d/c is to: Home              Patient currently is not medically stable to d/c.   Difficult to place patient No   Weakness improving.  Anticipated date of discharge 7/2    Level of care: Med-Surg  Consultants:  None  Procedures:  None  Antimicrobials: Levaquin   Subjective: Patient seen and examined.  Continues to feel weak though is improving.  Continues to endorse cough and shortness of breath.  Objective: Vitals:   01/10/21 1654 01/10/21 2012 01/11/21 0448 01/11/21 1011  BP: 127/71 123/81 (!) 125/97 (!) 134/98  Pulse: 88 (!) 101 73 92  Resp: 18 20 18 18   Temp: 98.4 F (36.9 C) 98.1 F (36.7 C) 97.6 F (36.4 C) 97.9 F (36.6 C)  TempSrc: Oral   Oral  SpO2: 97% 95% 100% 98%  Weight:      Height:        Intake/Output Summary (Last 24 hours) at 01/11/2021 1302 Last data filed at 01/10/2021 2155 Gross per 24 hour  Intake --  Output 850 ml  Net -850 ml    Filed Weights   01/06/21 2037 01/08/21 0500  Weight: 127 kg (!) 147.8 kg    Examination:  General exam: No acute distress Respiratory system: Clear to auscultation. Respiratory effort normal. Cardiovascular system: S1 & S2 heard, RRR. No JVD, murmurs, rubs, gallops or clicks. No  pedal edema. Gastrointestinal system: Soft, nontender, nondistended, normal bowel sounds Central nervous system: Alert and oriented. No focal neurological deficits. Extremities: Symmetric 5 x 5 power. Skin: No rashes, lesions or ulcers Psychiatry: Judgement and insight appear normal. Mood & affect appropriate.     Data Reviewed: I have personally reviewed following labs and imaging studies  CBC: Recent Labs  Lab  01/07/21 0054 01/07/21 0430 01/11/21 0602  WBC 16.0* 14.8* 16.2*  NEUTROABS 10.7* 9.7*  --   HGB 14.0 12.8 13.6  HCT 42.8 39.4 41.0  MCV 82.9 84.5 83.7  PLT 422* 361 354   Basic Metabolic Panel: Recent Labs  Lab 01/07/21 0054 01/07/21 0430 01/11/21 0602  NA 137 139  --   K 3.6 3.7  --   CL 104 105  --   CO2 25 26  --   GLUCOSE 117* 110*  --   BUN 14 13  --   CREATININE 0.90 0.88 1.10*  CALCIUM 9.2 8.3*  --   MG  --  2.0  --   PHOS  --  3.3  --    GFR: Estimated Creatinine Clearance: 103.6 mL/min (A) (by C-G formula based on SCr of 1.1 mg/dL (H)). Liver Function Tests: Recent Labs  Lab 01/07/21 0054 01/07/21 0430  AST 15 13*  ALT 15 13  ALKPHOS 88 76  BILITOT 0.5 0.4  PROT 7.9 6.7  ALBUMIN 3.9 3.4*   No results for input(s): LIPASE, AMYLASE in the last 168 hours. No results for input(s): AMMONIA in the last 168 hours. Coagulation Profile: No results for input(s): INR, PROTIME in the last 168 hours. Cardiac Enzymes: No results for input(s): CKTOTAL, CKMB, CKMBINDEX, TROPONINI in the last 168 hours. BNP (last 3 results) No results for input(s): PROBNP in the last 8760 hours. HbA1C: No results for input(s): HGBA1C in the last 72 hours. CBG: No results for input(s): GLUCAP in the last 168 hours. Lipid Profile: No results for input(s): CHOL, HDL, LDLCALC, TRIG, CHOLHDL, LDLDIRECT in the last 72 hours. Thyroid Function Tests: No results for input(s): TSH, T4TOTAL, FREET4, T3FREE, THYROIDAB in the last 72 hours.  Anemia Panel: No results for input(s): VITAMINB12, FOLATE, FERRITIN, TIBC, IRON, RETICCTPCT in the last 72 hours.  Sepsis Labs: Recent Labs  Lab 01/07/21 0106  PROCALCITON <0.10    Recent Results (from the past 240 hour(s))  SARS CORONAVIRUS 2 (TAT 6-24 HRS) Nasopharyngeal Nasopharyngeal Swab     Status: Abnormal   Collection Time: 01/03/21 12:04 PM   Specimen: Nasopharyngeal Swab  Result Value Ref Range Status   SARS Coronavirus 2 POSITIVE  (A) NEGATIVE Final    Comment: (NOTE) SARS-CoV-2 target nucleic acids are DETECTED.  The SARS-CoV-2 RNA is generally detectable in upper and lower respiratory specimens during the acute phase of infection. Positive results are indicative of the presence of SARS-CoV-2 RNA. Clinical correlation with patient history and other diagnostic information is  necessary to determine patient infection status. Positive results do not rule out bacterial infection or co-infection with other viruses.  The expected result is Negative.  Fact Sheet for Patients: HairSlick.no  Fact Sheet for Healthcare Providers: quierodirigir.com  This test is not yet approved or cleared by the [place] FDA and  has been authorized for detection and/or diagnosis of SARS-CoV-2 by FDA under an Emergency Use Authorization (EUA). This EUA will remain  in effect (meaning this test can be used) for the duration of the COVID-19 declaration under Section 564(b)(1) of the Act, 21 U. S.C. section  360bbb-3(b)(1), unless the authorization is terminated or revoked sooner.   Performed at [facility], [address], [place], [place] [address]   Group A Strep by PCR     Status: None   Collection Time: 01/03/21 12:04 PM   Specimen: Nasopharyngeal Swab; Sterile Swab  Result Value Ref Range Status   Group A Strep by PCR NOT DETECTED NOT DETECTED Final    Comment: Performed at [facility], [address], [place], [place] [address]  Urine culture     Status: Abnormal   Collection Time: 01/03/21  3:03 PM   Specimen: Urine, Random  Result Value Ref Range Status   Specimen Description   Final    URINE, RANDOM Performed at [facility], [address], [place], [place] [address]    Special Requests   Final    NONE Performed at [facility], [address] [place], [place] [address]    Culture (A)  Final    <10,000 COLONIES/mL  INSIGNIFICANT GROWTH Performed at [facility], [address] [place], [place] [address]    Report Status 01/05/2021 FINAL  Final         Radiology Studies: No results found.      Scheduled Meds:  amLODipine  5 mg Oral Daily   benzonatate  200 mg Oral TID   famotidine  20 mg Oral BID   feeding supplement  237 mL Oral BID BM   furosemide  40 mg Oral Daily   Ipratropium-Albuterol  1 puff Inhalation Q6H WA   predniSONE  40 mg Oral Q breakfast   topiramate  25 mg Oral QHS   Continuous Infusions:     LOS: 4 days    Time spent: 15 minutes    Tresa Moore, MD [facility] Pager [phone]  If 7PM-7AM, please contact night-coverage 01/11/2021, 1:02 PM

## 2021-01-11 NOTE — TOC Progression Note (Signed)
Transition of Care [facility]) - Progression Note    Patient Details  Name: Toni Robertson MRN: [id number] Date of Birth: [birth year]  Transition of Care [facility]) CM/SW Contact  Caryn Section, RN Phone Number: 01/11/2021, 12:51 PM  Clinical Narrative:   Per [place] at [facility], they can supply PT for patient.  Patient is anticipated to discharge tomorrow.           Expected Discharge Plan and Services                                                 Social Determinants of Health (SDOH) Interventions    Readmission Risk Interventions No flowsheet data found.

## 2021-01-12 MED ORDER — DIPHENHYDRAMINE HCL 25 MG PO CAPS
25.0000 mg | ORAL_CAPSULE | Freq: Four times a day (QID) | ORAL | 0 refills | Status: DC | PRN
Start: 2021-01-12 — End: 2022-06-09

## 2021-01-12 MED ORDER — BUTALBITAL-APAP-CAFFEINE 50-325-40 MG PO TABS
1.0000 | ORAL_TABLET | Freq: Four times a day (QID) | ORAL | 0 refills | Status: DC | PRN
Start: 2021-01-12 — End: 2022-06-09

## 2021-01-12 MED ORDER — TOPIRAMATE 25 MG PO TABS: 25.0000 mg | ORAL_TABLET | Freq: Every day | ORAL | 0 refills | Status: AC

## 2021-01-12 MED ORDER — AMLODIPINE BESYLATE 5 MG PO TABS: 5.0000 mg | ORAL_TABLET | Freq: Every day | ORAL | 0 refills | Status: DC

## 2021-01-12 MED ORDER — ONDANSETRON HCL 4 MG PO TABS: 4.0000 mg | ORAL_TABLET | Freq: Three times a day (TID) | ORAL | 0 refills | Status: AC | PRN

## 2021-01-12 MED ORDER — HYDROCOD POLST-CPM POLST ER 10-8 MG/5ML PO SUER: 5.0000 mL | Freq: Two times a day (BID) | ORAL | 0 refills | Status: AC | PRN

## 2021-01-12 MED ORDER — BENZONATATE 200 MG PO CAPS: 200.0000 mg | ORAL_CAPSULE | Freq: Three times a day (TID) | ORAL | 0 refills | Status: AC

## 2021-01-12 NOTE — Plan of Care (Signed)
  Problem: Education: Goal: Knowledge of risk factors and measures for prevention of condition will improve 01/12/2021 1123 by Orvan Seen, RN Outcome: Completed/Met 01/12/2021 1039 by Orvan Seen, RN Outcome: Progressing   Problem: Coping: Goal: Psychosocial and spiritual needs will be supported 01/12/2021 1123 by Orvan Seen, RN Outcome: Completed/Met 01/12/2021 1039 by Orvan Seen, RN Outcome: Progressing   Problem: Respiratory: Goal: Will maintain a patent airway 01/12/2021 1123 by Orvan Seen, RN Outcome: Completed/Met 01/12/2021 1039 by Orvan Seen, RN Outcome: Progressing Goal: Complications related to the disease process, condition or treatment will be avoided or minimized 01/12/2021 1123 by Orvan Seen, RN Outcome: Completed/Met 01/12/2021 1039 by Orvan Seen, RN Outcome: Progressing   Problem: Education: Goal: Knowledge of General Education information will improve Description: Including pain rating scale, medication(s)/side effects and non-pharmacologic comfort measures 01/12/2021 1123 by Orvan Seen, RN Outcome: Completed/Met 01/12/2021 1039 by Orvan Seen, RN Outcome: Progressing   Problem: Clinical Measurements: Goal: Ability to maintain clinical measurements within normal limits will improve 01/12/2021 1123 by Orvan Seen, RN Outcome: Completed/Met 01/12/2021 1039 by Orvan Seen, RN Outcome: Progressing Goal: Will remain free from infection 01/12/2021 1123 by Orvan Seen, RN Outcome: Completed/Met 01/12/2021 1039 by Orvan Seen, RN Outcome: Progressing Goal: Diagnostic test results will improve 01/12/2021 1123 by Orvan Seen, RN Outcome: Completed/Met 01/12/2021 1039 by Orvan Seen, RN Outcome: Progressing Goal: Respiratory complications will improve 01/12/2021 1123 by Orvan Seen, RN Outcome: Completed/Met 01/12/2021 1039 by Orvan Seen, RN Outcome: Progressing Goal: Cardiovascular  complication will be avoided 01/12/2021 1123 by Orvan Seen, RN Outcome: Completed/Met 01/12/2021 1039 by Orvan Seen, RN Outcome: Progressing   Problem: Pain Managment: Goal: General experience of comfort will improve 01/12/2021 1123 by Orvan Seen, RN Outcome: Completed/Met 01/12/2021 1039 by Orvan Seen, RN Outcome: Progressing   Problem: Safety: Goal: Ability to remain free from injury will improve 01/12/2021 1123 by Orvan Seen, RN Outcome: Completed/Met 01/12/2021 1039 by Orvan Seen, RN Outcome: Progressing   Problem: Skin Integrity: Goal: Risk for impaired skin integrity will decrease 01/12/2021 1123 by Orvan Seen, RN Outcome: Completed/Met 01/12/2021 1039 by Orvan Seen, RN Outcome: Progressing

## 2021-01-12 NOTE — Discharge Summary (Signed)
Physician Discharge Summary  Toni Robertson EHO:[id number] DOB: 1985-09-19 DOA: 01/06/2021  [facility]P: [facility]  Admit date: 01/06/2021 Discharge date: 01/12/2021  Admitted From: Home Disposition: Home with home health  Recommendations for Outpatient Follow-up:  Follow up with PCP in 1-2 weeks    Home Health: Yes Equipment/Devices: Rear wheel walker  Discharge Condition: Stable CODE STATUS: Full Diet recommendation: Heart Healthy  Brief/Interim Summary: Patient admitted 01/07/2021 with COVID-19 infection and wheezing and weakness and migraine headache.  The patient walking around with physical therapy felt very weak total-body and filed unsafe to go home.  From a COVID standpoint she is minimally symptomatic.  No radiographic evidence of pneumonia.  No fevers.  No oxygen requirement.  Patient's main symptom has been marked weakness preventing her from ambulating safely.  On day of discharge had a lengthy discussion with patient regarding her symptoms.  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  Symptoms been to be more GI and neurologic related.  Patient complains of persistent nausea and breakthrough headaches similar to what she has had in the past.  My explained the symptoms may take weeks to fully resolve.  She expresses understanding.  She will work from home for the next 6 weeks.  I have provided a work note to her at her request.  No COVID-specific medications will be prescribed on discharge.  Patient discharged home in stable condition.  Discharge Diagnoses:  Principal Problem:   COVID-19 virus infection Active Problems:   Generalized weakness   Cough   Leukocytosis   Acute intractable headache   GERD (gastroesophageal reflux disease)   Mild intermittent asthma   Morbid obesity with BMI of 50.0-59.9, adult (HCC)   Essential hypertension  COVID-19 infection Patient is  minimally symptomatic She endorses cough and wheezing No pneumonia on chest x-ray No fevers or oxygen requirement MRI imaging survey negative No antibiotics indicated at time of discharge No steroids given no oxygen requirement   Essential hypertension Blood pressure markedly elevated on 6/28 Possibly due to underlying stress and anxiety about situation Will recommend Norvasc 5 mg at time of discharge Follow-up with PCP   Otitis media Initially started on Augmentin however patient endorsed allergy Tolerating Levaquin Levaquin for 5-day course Completed in house   Bilateral flank pain Patient attributes this to the COVID medication which I discontinued CT abdomen renal stone study negative Urinalysis negative   History of migraine As needed Fioricet As needed Imitrex Daily Topamax   History of fibromyalgia   Morbid obesity BMI 55.93 This complicates overall care and prognosis     Discharge Instructions  Discharge Instructions     Diet - low sodium heart healthy   Complete by: As directed    Increase activity slowly   Complete by: As directed    Increase activity slowly   Complete by: As directed       Allergies as of 01/12/2021       Reactions   Coconut Flavor Anaphylaxis   Azithromycin Itching        Medication List     STOP taking these medications    amoxicillin-clavulanate 600-42.9 MG/5ML suspension Commonly known as: Augmentin ES-600   buPROPion 300 MG 24 hr tablet Commonly known as: WELLBUTRIN XL   gabapentin 300 MG capsule Commonly known as: NEURONTIN   montelukast 10 MG tablet Commonly known as: SINGULAIR   Paxlovid 20 x 150 MG & 10  x 100MG  Tbpk Generic drug: Nirmatrelvir & Ritonavir   predniSONE 20 MG tablet Commonly known as: DELTASONE       TAKE these medications    albuterol (2.5 MG/3ML) 0.083% nebulizer solution Commonly known as: PROVENTIL Take 3 mLs (2.5 mg total) by nebulization every 6 (six) hours as needed for  wheezing or shortness of breath.   albuterol 108 (90 Base) MCG/ACT inhaler Commonly known as: VENTOLIN HFA Inhale 2 puffs into the lungs every 6 (six) hours as needed for wheezing or shortness of breath.   amLODipine 5 MG tablet Commonly known as: NORVASC Take 1 tablet (5 mg total) by mouth daily. Start taking on: January 13, 2021   benzonatate 200 MG capsule Commonly known as: TESSALON Take 1 capsule (200 mg total) by mouth 3 (three) times daily for 14 days. What changed:  when to take this reasons to take this   butalbital-acetaminophen-caffeine 50-325-40 MG tablet Commonly known as: FIORICET Take 1 tablet by mouth every 6 (six) hours as needed for headache.   chlorpheniramine-HYDROcodone 10-8 MG/5ML Suer Commonly known as: Tussionex Pennkinetic ER Take 5 mLs by mouth every 12 (twelve) hours as needed for up to 7 days for cough. What changed:  when to take this reasons to take this   Compressor/Nebulizer Misc 1 Units by Does not apply route every 4 (four) hours as needed.   diphenhydrAMINE 25 mg capsule Commonly known as: BENADRYL Take 1 capsule (25 mg total) by mouth every 6 (six) hours as needed for itching.   famotidine 20 MG tablet Commonly known as: PEPCID Take 1 tablet (20 mg total) by mouth 2 (two) times daily.   ibuprofen 600 MG tablet Commonly known as: ADVIL Take 1 tablet (600 mg total) by mouth every 6 (six) hours as needed.   lidocaine 2 % solution Commonly known as: XYLOCAINE Use as directed 5 mLs in the mouth or throat every 6 (six) hours as needed for mouth pain. For swish and swallow.   magic mouthwash Soln Take 5 mLs by mouth 3 (three) times daily as needed for mouth pain.   ondansetron 4 MG tablet Commonly known as: ZOFRAN Take 1 tablet (4 mg total) by mouth every 8 (eight) hours as needed for nausea or vomiting.   rizatriptan 10 MG tablet Commonly known as: MAXALT Take 10 mg by mouth as needed for migraine.   topiramate 25 MG  tablet Commonly known as: TOPAMAX Take 1 tablet (25 mg total) by mouth at bedtime.        Follow-up Information     [place], [facility]. Schedule an appointment as soon as possible for a visit in 1 week(s).   Contact information: [address] [place] [address] [place] [id number] [phone]                Allergies  Allergen Reactions   Coconut Flavor Anaphylaxis   Azithromycin Itching    Consultations: None   Procedures/Studies: DG Neck Soft Tissue  Result Date: 01/03/2021 CLINICAL DATA:  Difficulty swallowing for several days. EXAM: NECK SOFT TISSUES - 1+ VIEW COMPARISON:  None. FINDINGS: There is no evidence of retropharyngeal soft tissue swelling or epiglottic enlargement. The cervical airway is unremarkable and no radio-opaque foreign body identified. IMPRESSION: Negative. Electronically Signed   By: Amie Portlandavid  Ormond M.D.   On: 01/03/2021 14:07   DG Chest 2 View  Result Date: 01/02/2021 CLINICAL DATA:  Productive cough since Sunday EXAM: CHEST - 2 VIEW COMPARISON:  06/14/2019 FINDINGS: Normal heart size and  mediastinal contours. Stable lung markings. No acute infiltrate or edema. No effusion or pneumothorax. No acute osseous findings. IMPRESSION: Stable from prior.  Negative for pneumonia. Electronically Signed   By: Marnee Spring M.D.   On: 01/02/2021 04:47   CT Head Wo Contrast  Result Date: 01/07/2021 CLINICAL DATA:  Headache EXAM: CT HEAD WITHOUT CONTRAST TECHNIQUE: Contiguous axial images were obtained from the base of the skull through the vertex without intravenous contrast. COMPARISON:  09/13/2019 FINDINGS: Brain: No acute intracranial abnormality. Specifically, no hemorrhage, hydrocephalus, mass lesion, acute infarction, or significant intracranial injury. Vascular: No hyperdense vessel or unexpected calcification. Skull: No acute calvarial abnormality. Sinuses/Orbits: No acute findings Other: None IMPRESSION: No acute intracranial abnormality. Electronically Signed    By: Charlett Nose M.D.   On: 01/07/2021 01:17   MR BRAIN WO CONTRAST  Result Date: 01/08/2021 CLINICAL DATA:  Neuro deficit, acute, stroke suspected. EXAM: MRI HEAD WITHOUT AND WITH CONTRAST MR VENOGRAM HEAD WITHOUT AND WITH CONTRAST TECHNIQUE: Multiplanar, multi-echo pulse sequences of the brain and surrounding structures were acquired without and with intravenous contrast. Angiographic images of the intracranial venous structures were acquired using MRV technique without and with intravenous contrast. CONTRAST:  38mL GADAVIST GADOBUTROL 1 MMOL/ML IV SOLN COMPARISON:  Head CT January 07, 2021 FINDINGS: MRI HEAD WITHOUT AND WITH CONTRAST Brain: No acute infarction, hemorrhage, hydrocephalus, extra-axial collection or mass lesion. The brain parenchyma has normal morphology and signal characteristics. No pathologic intracranial enhancement. Vascular: Normal flow voids. Skull and upper cervical spine: No focal marrow lesion. Sinuses/Orbits: No acute or significant finding. MR VENOGRAM HEAD WITHOUT AND WITH CONTRAST There is no evidence of dural venous sinus or deep cerebral vein thrombosis. No dural venous sinus stenosis. IMPRESSION: 1. No acute intracranial abnormality. 2. Unremarkable MRI and MR venogram of the brain. Electronically Signed   By: Baldemar Lenis M.D.   On: 01/08/2021 16:37   DG Chest Port 1 View  Result Date: 01/07/2021 CLINICAL DATA:  COVID, shortness of breath EXAM: PORTABLE CHEST 1 VIEW COMPARISON:  01/02/2021 FINDINGS: Heart and mediastinal contours are within normal limits. No focal opacities or effusions. No acute bony abnormality. IMPRESSION: No active disease. Electronically Signed   By: Charlett Nose M.D.   On: 01/07/2021 00:59   MR MRV HEAD W WO CONTRAST  Result Date: 01/08/2021 CLINICAL DATA:  Neuro deficit, acute, stroke suspected. EXAM: MRI HEAD WITHOUT AND WITH CONTRAST MR VENOGRAM HEAD WITHOUT AND WITH CONTRAST TECHNIQUE: Multiplanar, multi-echo pulse sequences of  the brain and surrounding structures were acquired without and with intravenous contrast. Angiographic images of the intracranial venous structures were acquired using MRV technique without and with intravenous contrast. CONTRAST:  15mL GADAVIST GADOBUTROL 1 MMOL/ML IV SOLN COMPARISON:  Head CT January 07, 2021 FINDINGS: MRI HEAD WITHOUT AND WITH CONTRAST Brain: No acute infarction, hemorrhage, hydrocephalus, extra-axial collection or mass lesion. The brain parenchyma has normal morphology and signal characteristics. No pathologic intracranial enhancement. Vascular: Normal flow voids. Skull and upper cervical spine: No focal marrow lesion. Sinuses/Orbits: No acute or significant finding. MR VENOGRAM HEAD WITHOUT AND WITH CONTRAST There is no evidence of dural venous sinus or deep cerebral vein thrombosis. No dural venous sinus stenosis. IMPRESSION: 1. No acute intracranial abnormality. 2. Unremarkable MRI and MR venogram of the brain. Electronically Signed   By: Baldemar Lenis M.D.   On: 01/08/2021 16:37   CT RENAL STONE STUDY  Result Date: 01/07/2021 CLINICAL DATA:  Left flank pain, COVID positive, history of urolithiasis  EXAM: CT ABDOMEN AND PELVIS WITHOUT CONTRAST TECHNIQUE: Multidetector CT imaging of the abdomen and pelvis was performed following the standard protocol without IV contrast. COMPARISON:  CT 06/28/2017 FINDINGS: Lower chest: Lung bases are clear. Normal heart size. No pericardial effusion. Hepatobiliary: No worrisome focal liver lesions. Smooth liver surface contour. Normal hepatic attenuation. Pancreas: No pancreatic ductal dilatation or surrounding inflammatory changes. Spleen: Normal in size. No concerning splenic lesions. No concerning adrenal nodules or masses. Adrenals/Urinary Tract: Kidneys are symmetric in size and normally located. No visible or contour deforming renal lesions. No urolithiasis or hydronephrosis. Urinary bladder is largely decompressed at the time of exam  and therefore poorly evaluated by CT imaging. No gross bladder abnormality accounting for underdistention. Stomach/Bowel: Sliding-type hiatal hernia. Stomach and duodenum are otherwise. Appendix is not visualized. No focal inflammation the vicinity of the cecum to suggest an occult appendicitis. No colonic dilatation or wall thickening. No evidence of bowel obstruction. Vascular/Lymphatic: No significant vascular findings are present. No enlarged abdominal or pelvic lymph nodes. Reproductive: Bilateral ligation clips. Uterus and bilateral adnexa are unremarkable. Other: No abdominopelvic free fluid or free gas. No bowel containing hernias. Small fat containing umbilical hernia. Musculoskeletal: No acute or significant osseous findings. IMPRESSION: No acute urinary tract abnormality, specifically no visible urolithiasis or hydronephrosis. No other acute CT abnormality to provide cause for patient's discomfort in the left flank. Small sliding-type hiatal hernia. Electronically Signed   By: Kreg Shropshire M.D.   On: 01/07/2021 15:39   (Echo, Carotid, EGD, Colonoscopy, ERCP)    Subjective: Patient seen and examined on the day of discharge.  Lengthy discussion regarding patient's status and overall treatment course as well as projected time to complete recovery.  Patient still endorses significant weakness requiring a walker to ambulate.  Explained that the symptoms are likely secondary to long COVID and may take weeks to resolve.  She expressed understanding.  She is medically stable for discharge.  Discharge Exam: Vitals:   01/12/21 0214 01/12/21 0913  BP: 127/87 (!) 125/108  Pulse: 86 85  Resp: 16   Temp: 97.7 F (36.5 C) (!) 97.5 F (36.4 C)  SpO2: 99% 100%   Vitals:   01/11/21 1629 01/11/21 2129 01/12/21 0214 01/12/21 0913  BP: 137/81 (!) 152/96 127/87 (!) 125/108  Pulse: 84 98 86 85  Resp: 16 16 16    Temp: 98.6 F (37 C) 97.7 F (36.5 C) 97.7 F (36.5 C) (!) 97.5 F (36.4 C)  TempSrc:     Oral  SpO2: 100% 100% 99% 100%  Weight:      Height:        General: Pt is alert, awake, not in acute distress Cardiovascular: RRR, S1/S2 +, no rubs, no gallops Respiratory: CTA bilaterally, no wheezing, no rhonchi Abdominal: Soft, NT, ND, bowel sounds + Extremities: no edema, no cyanosis    The results of significant diagnostics from this hospitalization (including imaging, microbiology, ancillary and laboratory) are listed below for reference.     Microbiology: Recent Results (from the past 240 hour(s))  SARS CORONAVIRUS 2 (TAT 6-24 HRS) Nasopharyngeal Nasopharyngeal Swab     Status: Abnormal   Collection Time: 01/03/21 12:04 PM   Specimen: Nasopharyngeal Swab  Result Value Ref Range Status   SARS Coronavirus 2 POSITIVE (A) NEGATIVE Final    Comment: (NOTE) SARS-CoV-2 target nucleic acids are DETECTED.  The SARS-CoV-2 RNA is generally detectable in upper and lower respiratory specimens during the acute phase of infection. Positive results are indicative of the presence of  SARS-CoV-2 RNA. Clinical correlation with patient history and other diagnostic information is  necessary to determine patient infection status. Positive results do not rule out bacterial infection or co-infection with other viruses.  The expected result is Negative.  Fact Sheet for Patients: HairSlick.no  Fact Sheet for Healthcare Providers: quierodirigir.com  This test is not yet approved or cleared by the [place] FDA and  has been authorized for detection and/or diagnosis of SARS-CoV-2 by FDA under an Emergency Use Authorization (EUA). This EUA will remain  in effect (meaning this test can be used) for the duration of the COVID-19 declaration under Section 564(b)(1) of the Act, 21 U. S.C. section 360bbb-3(b)(1), unless the authorization is terminated or revoked sooner.   Performed at [facility], [address], [place],  [place] [address]   Group A Strep by PCR     Status: None   Collection Time: 01/03/21 12:04 PM   Specimen: Nasopharyngeal Swab; Sterile Swab  Result Value Ref Range Status   Group A Strep by PCR NOT DETECTED NOT DETECTED Final    Comment: Performed at [facility], [address] [place], [place] [address]  Urine culture     Status: Abnormal   Collection Time: 01/03/21  3:03 PM   Specimen: Urine, Random  Result Value Ref Range Status   Specimen Description   Final    URINE, RANDOM Performed at [facility], [address] [place], [place] [address]    Special Requests   Final    NONE Performed at [facility], [address] [place], [place] [address]    Culture (A)  Final    <10,000 COLONIES/mL INSIGNIFICANT GROWTH Performed at [facility], [address] [place], [place] [address]    Report Status 01/05/2021 FINAL  Final     Labs: BNP (last 3 results) Recent Labs    01/07/21 0054  BNP 8.7   Basic Metabolic Panel: Recent Labs  Lab 01/07/21 0054 01/07/21 0430 01/11/21 0602  NA 137 139  --   K 3.6 3.7  --   CL 104 105  --   CO2 25 26  --   GLUCOSE 117* 110*  --   BUN 14 13  --   CREATININE 0.90 0.88 1.10*  CALCIUM 9.2 8.3*  --   MG  --  2.0  --   PHOS  --  3.3  --    Liver Function Tests: Recent Labs  Lab 01/07/21 0054 01/07/21 0430  AST 15 13*  ALT 15 13  ALKPHOS 88 76  BILITOT 0.5 0.4  PROT 7.9 6.7  ALBUMIN 3.9 3.4*   No results for input(s): LIPASE, AMYLASE in the last 168 hours. No results for input(s): AMMONIA in the last 168 hours. CBC: Recent Labs  Lab 01/07/21 0054 01/07/21 0430 01/11/21 0602  WBC 16.0* 14.8* 16.2*  NEUTROABS 10.7* 9.7*  --   HGB 14.0 12.8 13.6  HCT 42.8 39.4 41.0  MCV 82.9 84.5 83.7  PLT 422* 361 354   Cardiac Enzymes: No results for input(s): CKTOTAL, CKMB, CKMBINDEX, TROPONINI in the last 168 hours. BNP: Invalid input(s): POCBNP CBG: No results for input(s): GLUCAP in the  last 168 hours. D-Dimer No results for input(s): DDIMER in the last 72 hours. Hgb A1c No results for input(s): HGBA1C in the last 72 hours. Lipid Profile No results for input(s): CHOL, HDL, LDLCALC, TRIG, CHOLHDL, LDLDIRECT in the last 72 hours. Thyroid function studies No results for  input(s): TSH, T4TOTAL, T3FREE, THYROIDAB in the last 72 hours.  Invalid input(s): FREET3 Anemia work up No results for input(s): VITAMINB12, FOLATE, FERRITIN, TIBC, IRON, RETICCTPCT in the last 72 hours. Urinalysis    Component Value Date/Time   COLORURINE YELLOW (A) 01/07/2021 0350   APPEARANCEUR HAZY (A) 01/07/2021 0350   LABSPEC 1.029 01/07/2021 0350   PHURINE 5.0 01/07/2021 0350   GLUCOSEU NEGATIVE 01/07/2021 0350   HGBUR SMALL (A) 01/07/2021 0350   BILIRUBINUR NEGATIVE 01/07/2021 0350   KETONESUR NEGATIVE 01/07/2021 0350   PROTEINUR NEGATIVE 01/07/2021 0350   NITRITE NEGATIVE 01/07/2021 0350   LEUKOCYTESUR NEGATIVE 01/07/2021 0350   Sepsis Labs Invalid input(s): PROCALCITONIN,  WBC,  LACTICIDVEN Microbiology Recent Results (from the past 240 hour(s))  SARS CORONAVIRUS 2 (TAT 6-24 HRS) Nasopharyngeal Nasopharyngeal Swab     Status: Abnormal   Collection Time: 01/03/21 12:04 PM   Specimen: Nasopharyngeal Swab  Result Value Ref Range Status   SARS Coronavirus 2 POSITIVE (A) NEGATIVE Final    Comment: (NOTE) SARS-CoV-2 target nucleic acids are DETECTED.  The SARS-CoV-2 RNA is generally detectable in upper and lower respiratory specimens during the acute phase of infection. Positive results are indicative of the presence of SARS-CoV-2 RNA. Clinical correlation with patient history and other diagnostic information is  necessary to determine patient infection status. Positive results do not rule out bacterial infection or co-infection with other viruses.  The expected result is Negative.  Fact Sheet for Patients: HairSlick.no  Fact Sheet for Healthcare  Providers: quierodirigir.com  This test is not yet approved or cleared by the [place] FDA and  has been authorized for detection and/or diagnosis of SARS-CoV-2 by FDA under an Emergency Use Authorization (EUA). This EUA will remain  in effect (meaning this test can be used) for the duration of the COVID-19 declaration under Section 564(b)(1) of the Act, 21 U. S.C. section 360bbb-3(b)(1), unless the authorization is terminated or revoked sooner.   Performed at [facility], [address], [place], [place] [address]   Group A Strep by PCR     Status: None   Collection Time: 01/03/21 12:04 PM   Specimen: Nasopharyngeal Swab; Sterile Swab  Result Value Ref Range Status   Group A Strep by PCR NOT DETECTED NOT DETECTED Final    Comment: Performed at [facility], [address], [place], [place] [address]  Urine culture     Status: Abnormal   Collection Time: 01/03/21  3:03 PM   Specimen: Urine, Random  Result Value Ref Range Status   Specimen Description   Final    URINE, RANDOM Performed at [facility], [address], [address] [place], [place] [address]    Special Requests   Final    NONE Performed at [facility], [address] [place], [place] [address]    Culture (A)  Final    <10,000 COLONIES/mL INSIGNIFICANT GROWTH Performed at [facility], [address] [place], [place] [address]    Report Status 01/05/2021 FINAL  Final     Time coordinating discharge: Over 30 minutes  SIGNED:   Tresa Moore, MD  [facility] 01/12/2021, 12:26 PM Pager   If 7PM-7AM, please contact night-coverage

## 2021-01-12 NOTE — Progress Notes (Signed)
Patient discharged to home wheeled out of unit by Transport with all belongings.  Medications and discharge instructions reviewed.  All questions answered.  Patient verbalized signs and symptoms of infection.  Patient agreed to follow up with PCP in a week.  Patient satisfied with overall care at [facility].

## 2021-01-12 NOTE — Plan of Care (Signed)
  Problem: Education: Goal: Knowledge of risk factors and measures for prevention of condition will improve Outcome: Progressing   Problem: Coping: Goal: Psychosocial and spiritual needs will be supported Outcome: Progressing   Problem: Respiratory: Goal: Will maintain a patent airway Outcome: Progressing Goal: Complications related to the disease process, condition or treatment will be avoided or minimized Outcome: Progressing   Problem: Education: Goal: Knowledge of General Education information will improve Description: Including pain rating scale, medication(s)/side effects and non-pharmacologic comfort measures Outcome: Progressing   Problem: Clinical Measurements: Goal: Ability to maintain clinical measurements within normal limits will improve Outcome: Progressing Goal: Will remain free from infection Outcome: Progressing Goal: Diagnostic test results will improve Outcome: Progressing Goal: Respiratory complications will improve Outcome: Progressing Goal: Cardiovascular complication will be avoided Outcome: Progressing   Problem: Pain Managment: Goal: General experience of comfort will improve Outcome: Progressing   Problem: Safety: Goal: Ability to remain free from injury will improve Outcome: Progressing   Problem: Skin Integrity: Goal: Risk for impaired skin integrity will decrease Outcome: Progressing

## 2022-04-08 ENCOUNTER — Ambulatory Visit
Admission: RE | Admit: 2022-04-08 | Discharge: 2022-04-08 | Disposition: A | Discharge status: Home / Self Care | Payer: BC Managed Care – PPO | Source: Ambulatory Visit | Attending: Family Medicine | Admitting: Family Medicine

## 2022-04-08 ENCOUNTER — Ambulatory Visit
Admission: RE | Admit: 2022-04-08 | Discharge: 2022-04-08 | Disposition: A | Discharge status: Home / Self Care | Payer: BC Managed Care – PPO | Attending: Family Medicine | Admitting: Family Medicine

## 2022-04-08 ENCOUNTER — Other Ambulatory Visit: Payer: Self-pay | Admitting: Family Medicine

## 2022-04-08 DIAGNOSIS — R051 Acute cough: Secondary | ICD-10-CM | POA: Insufficient documentation

## 2022-04-15 ENCOUNTER — Emergency Department: Payer: BC Managed Care – PPO

## 2022-04-15 ENCOUNTER — Emergency Department
Admission: EM | Admit: 2022-04-15 | Discharge: 2022-04-15 | Disposition: A | Discharge status: Home / Self Care | Payer: BC Managed Care – PPO | Attending: Emergency Medicine | Admitting: Emergency Medicine

## 2022-04-15 ENCOUNTER — Encounter: Payer: Self-pay | Admitting: Emergency Medicine

## 2022-04-15 DIAGNOSIS — J4520 Mild intermittent asthma, uncomplicated: Secondary | ICD-10-CM | POA: Insufficient documentation

## 2022-04-15 DIAGNOSIS — R0789 Other chest pain: Secondary | ICD-10-CM | POA: Diagnosis present

## 2022-04-15 DIAGNOSIS — Z8616 Personal history of COVID-19: Secondary | ICD-10-CM | POA: Insufficient documentation

## 2022-04-15 DIAGNOSIS — R079 Chest pain, unspecified: Secondary | ICD-10-CM

## 2022-04-15 DIAGNOSIS — Z20822 Contact with and (suspected) exposure to covid-19: Secondary | ICD-10-CM | POA: Diagnosis not present

## 2022-04-15 DIAGNOSIS — R0602 Shortness of breath: Secondary | ICD-10-CM | POA: Insufficient documentation

## 2022-04-15 DIAGNOSIS — I10 Essential (primary) hypertension: Secondary | ICD-10-CM | POA: Insufficient documentation

## 2022-04-15 DIAGNOSIS — D72829 Elevated white blood cell count, unspecified: Secondary | ICD-10-CM | POA: Diagnosis not present

## 2022-04-15 LAB — CBC WITH DIFFERENTIAL/PLATELET
Abs Immature Granulocytes: 0.04 10*3/uL (ref 0.00–0.07)
Basophils Absolute: 0 10*3/uL (ref 0.0–0.1)
Basophils Relative: 0 %
Eosinophils Absolute: 0.1 10*3/uL (ref 0.0–0.5)
Eosinophils Relative: 1 %
HCT: 37.4 % (ref 36.0–46.0)
Hemoglobin: 11.9 g/dL — ABNORMAL LOW (ref 12.0–15.0)
Immature Granulocytes: 0 %
Lymphocytes Relative: 17 %
Lymphs Abs: 2.1 10*3/uL (ref 0.7–4.0)
MCH: 26.6 pg (ref 26.0–34.0)
MCHC: 31.8 g/dL (ref 30.0–36.0)
MCV: 83.7 fL (ref 80.0–100.0)
Monocytes Absolute: 0.7 10*3/uL (ref 0.1–1.0)
Monocytes Relative: 6 %
Neutro Abs: 9 10*3/uL — ABNORMAL HIGH (ref 1.7–7.7)
Neutrophils Relative %: 76 %
Platelets: 347 10*3/uL (ref 150–400)
RBC: 4.47 MIL/uL (ref 3.87–5.11)
RDW: 14.2 % (ref 11.5–15.5)
WBC: 12 10*3/uL — ABNORMAL HIGH (ref 4.0–10.5)
nRBC: 0 % (ref 0.0–0.2)

## 2022-04-15 LAB — COMPREHENSIVE METABOLIC PANEL
ALT: 13 U/L (ref 0–44)
AST: 13 U/L — ABNORMAL LOW (ref 15–41)
Albumin: 3.6 g/dL (ref 3.5–5.0)
Alkaline Phosphatase: 90 U/L (ref 38–126)
Anion gap: 5 (ref 5–15)
BUN: 16 mg/dL (ref 6–20)
CO2: 27 mmol/L (ref 22–32)
Calcium: 8.7 mg/dL — ABNORMAL LOW (ref 8.9–10.3)
Chloride: 107 mmol/L (ref 98–111)
Creatinine, Ser: 0.86 mg/dL (ref 0.44–1.00)
GFR, Estimated: >60 mL/min (ref >60)
Glucose, Bld: 111 mg/dL — ABNORMAL HIGH (ref 70–99)
Potassium: 3.8 mmol/L (ref 3.5–5.1)
Sodium: 139 mmol/L (ref 135–145)
Total Bilirubin: 0.4 mg/dL (ref 0.3–1.2)
Total Protein: 7.1 g/dL (ref 6.5–8.1)

## 2022-04-15 LAB — D-DIMER, QUANTITATIVE: D-Dimer, Quant: <0.27 ug/mL-FEU (ref 0.00–0.50)

## 2022-04-15 LAB — SARS CORONAVIRUS 2 BY RT PCR: SARS Coronavirus 2 by RT PCR: NEGATIVE

## 2022-04-15 MED ORDER — ACETAMINOPHEN 500 MG PO TABS
1000.0000 mg | ORAL_TABLET | Freq: Once | ORAL | Status: AC
  Administered 2022-04-15: 1000 mg via ORAL
  Filled 2022-04-15: qty 2

## 2022-04-15 MED ORDER — ONDANSETRON HCL 4 MG/2ML IJ SOLN
4.0000 mg | Freq: Once | INTRAMUSCULAR | Status: AC
  Administered 2022-04-15: 4 mg via INTRAVENOUS
  Filled 2022-04-15: qty 2

## 2022-04-15 MED ORDER — KETOROLAC TROMETHAMINE 15 MG/ML IJ SOLN
15.0000 mg | Freq: Once | INTRAMUSCULAR | Status: AC
  Administered 2022-04-15: 15 mg via INTRAVENOUS
  Filled 2022-04-15: qty 1

## 2022-04-15 MED ORDER — ALUM & MAG HYDROXIDE-SIMETH 200-200-20 MG/5ML PO SUSP
30.0000 mL | Freq: Once | ORAL | Status: AC
  Administered 2022-04-15: 30 mL via ORAL
  Filled 2022-04-15: qty 30

## 2022-04-15 NOTE — ED Provider Notes (Signed)
[facility] Provider Note    Event Date/Time   First MD Initiated Contact with Patient 04/15/22 [phone]     (approximate)   History   Shortness of Breath   HPI  Toni Robertson is a 36 y.o. female with past medical history of fibromyalgia, hypertension who presents with shortness of breath.  Patient notes that her symptoms started about 12 days ago.  Initially started with body aches cough and sore throat.  Saw her primary doctor and had negative COVID test and negative strep test but was started on antibiotics for presumed pneumonia although chest x-ray was negative.  Over the last about 4 days she has now developed shortness of breath and left-sided chest pain.  She uses her inhaler which she has had to use since having COVID in the next more frequently.  Her pain in her left chest is now worsening which is why she comes in tonight.  The patient denies hx of prior DVT/PE, unilateral leg pain/swelling, hormone use, recent surgery, hx of cancer, prolonged immobilization, or hemoptysis.      Past Medical History:  Diagnosis Date   Family history of adverse reaction to anesthesia    MATERNAL GRANDFATHER-HARD TO WAKE UP   Fibromyalgia    Headache    MIGRAINE   History of kidney stones 06/2017   History of methicillin resistant staphylococcus aureus (MRSA) 2013    Patient Active Problem List   Diagnosis Date Noted   Essential hypertension    COVID-19 virus infection 01/07/2021   Generalized weakness 01/07/2021   Cough 01/07/2021   Leukocytosis 01/07/2021   Acute intractable headache 01/07/2021   GERD (gastroesophageal reflux disease) 01/07/2021   Mild intermittent asthma 01/07/2021   Otitis media    Flank pain    Morbid obesity with BMI of 50.0-59.9, adult [facility])    Fibromyalgia      Physical Exam  Triage Vital Signs: ED Triage Vitals  Enc Vitals Group     BP 04/15/22 0417 (!) 151/109     Pulse Rate 04/15/22 0417 94     Resp 04/15/22 0417 18      Temp 04/15/22 0417 98.4 F (36.9 C)     Temp Source 04/15/22 0417 Oral     SpO2 04/15/22 0417 98 %     Weight 04/15/22 0417 (!) 320 lb (145.2 kg)     Height 04/15/22 0417 5\' 4"  (1.626 m)     Head Circumference --      Peak Flow --      Pain Score 04/15/22 0416 8     Pain Loc --      Pain Edu? --      Excl. in GC? --     Most recent vital signs: Vitals:   04/15/22 0417 04/15/22 0600  BP: (!) 151/109 128/66  Pulse: 94 73  Resp: 18 16  Temp: 98.4 F (36.9 C)   SpO2: 98% 97%     General: Awake, no distress.  CV:  Good peripheral perfusion. No edema Resp:  Normal effort. Clear lungs Abd:  No distention.  Neuro:             Awake, Alert, Oriented x 3  Other:     ED Results / Procedures / Treatments  Labs (all labs ordered are listed, but only abnormal results are displayed) Labs Reviewed  COMPREHENSIVE METABOLIC PANEL - Abnormal; Notable for the following components:      Result Value   Glucose, Bld 111 (*)  Calcium 8.7 (*)    AST 13 (*)    All other components within normal limits  CBC WITH DIFFERENTIAL/PLATELET - Abnormal; Notable for the following components:   WBC 12.0 (*)    Hemoglobin 11.9 (*)    Neutro Abs 9.0 (*)    All other components within normal limits  SARS CORONAVIRUS 2 BY RT PCR  D-DIMER, QUANTITATIVE  POC URINE PREG, ED     EKG  EKG interpretation performed by myself: NSR, nml axis, nml intervals, no acute ischemic changes    RADIOLOGY I reviewed and interpreted the CXR which does not show any acute cardiopulmonary process    PROCEDURES:  Critical Care performed: No  .1-3 Lead EKG Interpretation  Performed by: Georga Hacking, MD Authorized by: Georga Hacking, MD     Interpretation: normal     ECG rate assessment: normal     Rhythm: sinus rhythm     Ectopy: none     Conduction: normal     The patient is on the cardiac monitor to evaluate for evidence of arrhythmia and/or significant heart rate  changes.   MEDICATIONS ORDERED IN ED: Medications  ketorolac (TORADOL) 15 MG/ML injection 15 mg (15 mg Intravenous Given 04/15/22 0502)  ondansetron (ZOFRAN) injection 4 mg (4 mg Intravenous Given 04/15/22 0532)  alum & mag hydroxide-simeth (MAALOX/MYLANTA) 200-200-20 MG/5ML suspension 30 mL (30 mLs Oral Given 04/15/22 0614)  acetaminophen (TYLENOL) tablet 1,000 mg (1,000 mg Oral Given 04/15/22 9449)     IMPRESSION / MDM / ASSESSMENT AND PLAN / ED COURSE  I reviewed the triage vital signs and the nursing notes.                              Patient's presentation is most consistent with acute presentation with potential threat to life or bodily function.  Differential diagnosis includes, but is not limited to, pleurisy secondary to viral illness, musculoskeletal pain secondary to cough, bacterial pneumonia, pneumothorax, pulmonary embolism  The patient is a 36 year old who presents with pleuritic chest pain in the setting of about 12 days of cough sore throat fatigue body aches.  She is seen her primary care doctor several times and is on 2 antibiotics currently.  Using her inhaler frequently due to dyspnea.  She has had cough no hemoptysis.  Will last 4 days is now having chest pain that is pleuritic primarily on the left.  Patient's vitals are reassuring she is mildly tachycardic when I am in the room so cannot apply the [facility] rule.  Her exam overall is reassuring lungs are clear and there is no signs of DVT.  EKG is nonischemic.  Chest x-ray reviewed interpreted myself does not show any evidence of an infiltrate effusion or pneumothorax.  I suspect that this is pleurisy in the setting of viral illness.  However I do think she needs to be evaluated for PE so will obtain D-dimer given we cannot apply PERC.  Patient's chest x-ray is clear.  D-dimer is negative.  She has mild leukocytosis but labs including CMP and CBC are otherwise reassuring.  Again I suspect that this is most likely pleurisy  versus musculoskeletal pain in the setting of viral illness.  Discussed supportive care with the patient.  Think it is reasonable for her to DC her antibiotics.   On reassessment patient is complaining of some upper abdominal pain made worse from the Toradol.  On reassessment her abdomen is soft  she has some mild right upper quadrant tenderness but my suspicion for acute intra-abdominal process based on her exam is low.  Patient given GI cocktail and Tylenol.   FINAL CLINICAL IMPRESSION(S) / ED DIAGNOSES   Final diagnoses:  Chest pain, unspecified type     Rx / DC Orders   ED Discharge Orders     None        Note:  This document was prepared using Dragon voice recognition software and may include unintentional dictation errors.   Rada Hay, MD 04/15/22 [phone]

## 2022-04-15 NOTE — Discharge Instructions (Addendum)
Your chest x-ray was clear and your blood work including screening for blood clots in your lungs was reassuring.  You can continue to take Tylenol and Motrin for your pain.  I suspect that your pain is related to a viral illness.  Please also rest and make sure you are staying hydrated.  Please follow-up with your primary doctor.

## 2022-04-15 NOTE — ED Triage Notes (Signed)
Pt presents via POV with complaints of SOB that started last Tuesday - pt was seen at her PCP and has been treating her for PNA but she has had an increase in sx since that time. Pt also endorses left sided rib pain under her left breast that started a few days ago. Denies CP.

## 2022-06-09 ENCOUNTER — Ambulatory Visit (INDEPENDENT_AMBULATORY_CARE_PROVIDER_SITE_OTHER): Payer: BC Managed Care – PPO | Admitting: Internal Medicine

## 2022-06-09 VITALS — BP 150/96 | HR 80 | Resp 16 | Ht 64.0 in | Wt 335.0 lb

## 2022-06-09 DIAGNOSIS — G4733 Obstructive sleep apnea (adult) (pediatric): Secondary | ICD-10-CM

## 2022-06-09 DIAGNOSIS — Z6841 Body Mass Index (BMI) 40.0 and over, adult: Secondary | ICD-10-CM | POA: Diagnosis not present

## 2022-06-09 NOTE — Progress Notes (Signed)
Sleep Medicine   Office Visit  Patient Name: Toni Robertson DOB: 10/27/1985 MRN [id number]    Chief Complaint: sleep evaluation  Brief History:  Toni Robertson presents for an initial evaluation for possible sleep apnea. She is undergoing evaluation for bariatric surgery. BMI is 57.  Sleep quality is poor. This is noted most nights. The patient's bed partner reports loud snoring at night. The patient relates the following symptoms: loud snoring, gasping/choking, excessive daytime sleepiness, morning headaches and lack of focus are also present. The patient goes to sleep at 11 pm and wakes up at 5 am.    Sleep quality is worse when outside home environment.  Patient has noted does not complain of her legs at night.  The patient  relates sleepwalking behavior during the night (in past, as a teen)  The patient reports a history of psychiatric problems (anxiety)  The Epworth Sleepiness Score is 14 out of 24 .  The patient relates  Cardiovascular risk factors include: hypertension.      ROS  General: (-) fever, (-) chills, (-) night sweat Nose and Sinuses: (-) nasal stuffiness or itchiness, (-) postnasal drip, (-) nosebleeds, (-) sinus trouble. Mouth and Throat: (-) sore throat, (-) hoarseness. Neck: (-) swollen glands, (-) enlarged thyroid, (-) neck pain. Respiratory: - cough, - shortness of breath, - wheezing. Neurologic: - numbness, - tingling. Psychiatric: + anxiety, - depression Sleep behavior: -sleep paralysis -hypnogogic hallucinations -dream enactment      -vivid dreams -cataplexy -night terrors +sleep walking   Current Medication: Outpatient Encounter Medications as of 06/09/2022  Medication Sig   albuterol (PROVENTIL) (2.5 MG/3ML) 0.083% nebulizer solution Take 3 mLs (2.5 mg total) by nebulization every 6 (six) hours as needed for wheezing or shortness of breath.   albuterol (VENTOLIN HFA) 108 (90 Base) MCG/ACT inhaler Inhale 2 puffs into the lungs every 6 (six) hours as needed for  wheezing or shortness of breath.   amLODipine (NORVASC) 5 MG tablet Take 1 tablet (5 mg total) by mouth daily.   butalbital-acetaminophen-caffeine (FIORICET) 50-325-40 MG tablet Take 1 tablet by mouth every 6 (six) hours as needed for headache.   diphenhydrAMINE (BENADRYL) 25 mg capsule Take 1 capsule (25 mg total) by mouth every 6 (six) hours as needed for itching.   famotidine (PEPCID) 20 MG tablet Take 1 tablet (20 mg total) by mouth 2 (two) times daily.   ibuprofen (ADVIL) 600 MG tablet Take 1 tablet (600 mg total) by mouth every 6 (six) hours as needed.   lidocaine (XYLOCAINE) 2 % solution Use as directed 5 mLs in the mouth or throat every 6 (six) hours as needed for mouth pain. For swish and swallow.   magic mouthwash SOLN Take 5 mLs by mouth 3 (three) times daily as needed for mouth pain.   Nebulizers (COMPRESSOR/NEBULIZER) MISC 1 Units by Does not apply route every 4 (four) hours as needed.   ondansetron (ZOFRAN) 4 MG tablet Take 1 tablet (4 mg total) by mouth every 8 (eight) hours as needed for nausea or vomiting.   rizatriptan (MAXALT) 10 MG tablet Take 10 mg by mouth as needed for migraine.   topiramate (TOPAMAX) 25 MG tablet Take 1 tablet (25 mg total) by mouth at bedtime.   [DISCONTINUED] DULoxetine (CYMBALTA) 20 MG capsule Take 20 mg by mouth daily. (Patient not taking: Reported on 01/07/2021)   No facility-administered encounter medications on file as of 06/09/2022.    Surgical History: Past Surgical History:  Procedure Laterality Date   IUD REMOVAL N/A  07/17/2017   Procedure: INTRAUTERINE DEVICE (IUD) REMOVAL;  Surgeon: Schermerhorn, Ihor Austin, MD;  Location: [facility] ORS;  Service: Gynecology;  Laterality: N/A;   LAPAROSCOPIC TUBAL LIGATION Bilateral 07/17/2017   Procedure: LAPAROSCOPIC TUBAL LIGATION;  Surgeon: Schermerhorn, Ihor Austin, MD;  Location: [facility] ORS;  Service: Gynecology;  Laterality: Bilateral;    Medical History: Past Medical History:  Diagnosis Date   Family history  of adverse reaction to anesthesia    MATERNAL GRANDFATHER-HARD TO WAKE UP   Fibromyalgia    Headache    MIGRAINE   History of kidney stones 06/2017   History of methicillin resistant staphylococcus aureus (MRSA) 2013    Family History: Non contributory to the present illness  Social History: Social History   Socioeconomic History   Marital status: Single    Spouse name: Not on file   Number of children: Not on file   Years of education: Not on file   Highest education level: Not on file  Occupational History   Not on file  Tobacco Use   Smoking status: Never   Smokeless tobacco: Never  Vaping Use   Vaping Use: Never used  Substance and Sexual Activity   Alcohol use: Yes    Comment: social   Drug use: No   Sexual activity: Not on file  Other Topics Concern   Not on file  Social History Narrative   Not on file   Social Determinants of Health   Financial Resource Strain: Not on file  Food Insecurity: Not on file  Transportation Needs: Not on file  Physical Activity: Not on file  Stress: Not on file  Social Connections: Not on file  Intimate Partner Violence: Not on file    Vital Signs: There were no vitals taken for this visit. There is no height or weight on file to calculate BMI.   Examination: General Appearance: The patient is well-developed, well-nourished, and in no distress. Neck Circumference: 41 cm Skin: Gross inspection of skin unremarkable. Head: normocephalic, no gross deformities. Eyes: no gross deformities noted. ENT: ears appear grossly normal Neurologic: Alert and oriented. No involuntary movements.    STOP BANG RISK ASSESSMENT S (snore) Have you been told that you snore?     YES   T (tired) Are you often tired, fatigued, or sleepy during the day?   YES  O (obstruction) Do you stop breathing, choke, or gasp during sleep? YES   P (pressure) Do you have or are you being treated for high blood pressure? YES   B (BMI) Is your body  index greater than 35 kg/m? YES   A (age) Are you 102 years old or older? NO   N (neck) Do you have a neck circumference greater than 16 inches?   YES   G (gender) Are you a female? NO   TOTAL STOP/BANG "YES" ANSWERS 6                                                               A STOP-Bang score of 2 or less is considered low risk, and a score of 5 or more is high risk for having either moderate or severe OSA. For people who score 3 or 4, doctors may need to perform further assessment to determine how likely they are to  have OSA.         EPWORTH SLEEPINESS SCALE:  Scale:  (0)= no chance of dozing; (1)= slight chance of dozing; (2)= moderate chance of dozing; (3)= high chance of dozing  Chance  Situtation    Sitting and reading: 2    Watching TV: 3    Sitting Inactive in public: 1    As a passenger in car: 2      Lying down to rest: 3    Sitting and talking: 1    Sitting quielty after lunch: 2    In a car, stopped in traffic: 0   TOTAL SCORE:   14 out of 24    SLEEP STUDIES:  None on file   LABS: Recent Results (from the past 2160 hour(s))  SARS Coronavirus 2 by RT PCR (hospital order, performed in [facility] hospital lab) *cepheid single result test* Anterior Nasal Swab     Status: None   Collection Time: 04/15/22  4:22 AM   Specimen: Anterior Nasal Swab  Result Value Ref Range   SARS Coronavirus 2 by RT PCR NEGATIVE NEGATIVE    Comment: (NOTE) SARS-CoV-2 target nucleic acids are NOT DETECTED.  The SARS-CoV-2 RNA is generally detectable in upper and lower respiratory specimens during the acute phase of infection. The lowest concentration of SARS-CoV-2 viral copies this assay can detect is 250 copies / mL. A negative result does not preclude SARS-CoV-2 infection and should not be used as the sole basis for treatment or other patient management decisions.  A negative result may occur with improper specimen collection / handling, submission of specimen  other than nasopharyngeal swab, presence of viral mutation(s) within the areas targeted by this assay, and inadequate number of viral copies (<250 copies / mL). A negative result must be combined with clinical observations, patient history, and epidemiological information.  Fact Sheet for Patients:   RoadLapTop.co.za  Fact Sheet for Healthcare Providers: http://kim-miller.com/  This test is not yet approved or  cleared by the [place] FDA and has been authorized for detection and/or diagnosis of SARS-CoV-2 by FDA under an Emergency Use Authorization (EUA).  This EUA will remain in effect (meaning this test can be used) for the duration of the COVID-19 declaration under Section 564(b)(1) of the Act, 21 U.S.C. section 360bbb-3(b)(1), unless the authorization is terminated or revoked sooner.  Performed at [facility], [address] [place], [place] [address]   Comprehensive metabolic panel     Status: Abnormal   Collection Time: 04/15/22  4:51 AM  Result Value Ref Range   Sodium 139 135 - 145 mmol/L   Potassium 3.8 3.5 - 5.1 mmol/L   Chloride 107 98 - 111 mmol/L   CO2 27 22 - 32 mmol/L   Glucose, Bld 111 (H) 70 - 99 mg/dL    Comment: Glucose reference range applies only to samples taken after fasting for at least 8 hours.   BUN 16 6 - 20 mg/dL   Creatinine, Ser 8.29 0.44 - 1.00 mg/dL   Calcium 8.7 (L) 8.9 - 10.3 mg/dL   Total Protein 7.1 6.5 - 8.1 g/dL   Albumin 3.6 3.5 - 5.0 g/dL   AST 13 (L) 15 - 41 U/L   ALT 13 0 - 44 U/L   Alkaline Phosphatase 90 38 - 126 U/L   Total Bilirubin 0.4 0.3 - 1.2 mg/dL   GFR, Estimated >93 >71 mL/min    Comment: (NOTE) Calculated using the CKD-EPI Creatinine Equation (2021)  Anion gap 5 5 - 15    Comment: Performed at [facility], [address] [place], [place] [address]  CBC with Differential     Status: Abnormal   Collection Time: 04/15/22  4:51 AM  Result Value  Ref Range   WBC 12.0 (H) 4.0 - 10.5 K/uL   RBC 4.47 3.87 - 5.11 MIL/uL   Hemoglobin 11.9 (L) 12.0 - 15.0 g/dL   HCT 21.3 08.6 - 57.8 %   MCV 83.7 80.0 - 100.0 fL   MCH 26.6 26.0 - 34.0 pg   MCHC 31.8 30.0 - 36.0 g/dL   RDW 46.9 62.9 - 52.8 %   Platelets 347 150 - 400 K/uL   nRBC 0.0 0.0 - 0.2 %   Neutrophils Relative % 76 %   Neutro Abs 9.0 (H) 1.7 - 7.7 K/uL   Lymphocytes Relative 17 %   Lymphs Abs 2.1 0.7 - 4.0 K/uL   Monocytes Relative 6 %   Monocytes Absolute 0.7 0.1 - 1.0 K/uL   Eosinophils Relative 1 %   Eosinophils Absolute 0.1 0.0 - 0.5 K/uL   Basophils Relative 0 %   Basophils Absolute 0.0 0.0 - 0.1 K/uL   Immature Granulocytes 0 %   Abs Immature Granulocytes 0.04 0.00 - 0.07 K/uL    Comment: Performed at [facility], [address] [place], [place] [address]  D-dimer, quantitative     Status: None   Collection Time: 04/15/22  4:51 AM  Result Value Ref Range   D-Dimer, Quant <0.27 0.00 - 0.50 ug/mL-FEU    Comment: (NOTE) At the manufacturer cut-off value of 0.5 g/mL FEU, this assay has a negative predictive value of 95-100%.This assay is intended for use in conjunction with a clinical pretest probability (PTP) assessment model to exclude pulmonary embolism (PE) and deep venous thrombosis (DVT) in outpatients suspected of PE or DVT. Results should be correlated with clinical presentation. Performed at [facility], [address] [place] [place], [place] [address]     Radiology: [facility] Chest [place] 1 View  Result Date: 04/15/2022 CLINICAL DATA:  Shortness of breath EXAM: PORTABLE CHEST 1 VIEW COMPARISON:  04/08/2022 FINDINGS: Normal heart size and mediastinal contours. No acute infiltrate or edema. No effusion or pneumothorax. No acute osseous findings. IMPRESSION: Negative chest. Electronically Signed   By: Tiburcio Pea M.D.   On: 04/15/2022 04:49    No results found.  No results found.    Assessment and Plan: Patient Active Problem List    Diagnosis Date Noted   Essential hypertension    COVID-19 virus infection 01/07/2021   Generalized weakness 01/07/2021   Cough 01/07/2021   Leukocytosis 01/07/2021   Acute intractable headache 01/07/2021   GERD (gastroesophageal reflux disease) 01/07/2021   Mild intermittent asthma 01/07/2021   Otitis media    Flank pain    Morbid obesity with BMI of 50.0-59.9, adult (HCC)    Fibromyalgia    1. OSA (obstructive sleep apnea) PLAN OSA:   Patient evaluation suggests high risk of sleep disordered breathing due to snoring, choking, gasping, daytime sleepiness, morning headaches, morbid obesity.  Patient has comorbid cardiovascular risk factors including: hypertension which could be exacerbated by pathologic sleep-disordered breathing.  Suggest: PSG to assess  the patient's sleep disordered breathing. The patient was also counselled on weight loss to optimize sleep health.  2. Morbid obesity with BMI of 50.0-59.9, adult (HCC) Obesity Counseling: Had a lengthy discussion regarding patients BMI and weight issues. Patient was instructed on portion control  as well as increased activity. Also discussed caloric restrictions with trying to maintain intake less than 2000 Kcal. Discussions were made in accordance with the 5As of weight management. Simple actions such as not eating late and if able to, taking a walk is suggested.      General Counseling: I have discussed the findings of the evaluation and examination with Toni Robertson.  I have also discussed any further diagnostic evaluation thatmay be needed or ordered today. Toni Robertson verbalizes understanding of the findings of todays visit. We also reviewed her medications today and discussed drug interactions and side effects including but not limited excessive drowsiness and altered mental states. We also discussed that there is always a risk not just to her but also people around her. she has been encouraged to call the office with any questions or concerns  that should arise related to todays visit.  No orders of the defined types were placed in this encounter.       I have personally obtained a history, evaluated the patient, evaluated pertinent data, formulated the assessment and plan and placed orders.   This patient was seen today by Emmaline Kluver, PA-C in collaboration with Dr. Freda Munro.   Yevonne Pax, MD [facility] Diplomate ABMS Pulmonary and Critical Care Medicine Sleep medicine

## 2022-10-04 ENCOUNTER — Encounter (INDEPENDENT_AMBULATORY_CARE_PROVIDER_SITE_OTHER): Payer: 59 | Admitting: Internal Medicine

## 2022-10-04 DIAGNOSIS — G4733 Obstructive sleep apnea (adult) (pediatric): Secondary | ICD-10-CM

## 2022-10-07 NOTE — Procedures (Signed)
[place] Report Part I  Phone: [phone] Fax: [phone]  Patient Name: Toni Robertson, Toni Robertson. Acquisition Number: [id number]  Date of Birth: 1986-01-16 Acquisition Date: 10/04/2022  Referring Physician: Emelda Brothers, MD     History: The patient is a 37 year old  . Medical History: anxiety, fibromyalgia, prediabetes, complicated migraine, allergic rhinitis, GERD, morbid obesity and essential hypertension.  Medications: albuterol 90, amlodipine, Breo Ellipta and montelukast.  Procedure: This routine overnight polysomnogram was performed on the [facility] 5 using the standard CPAP protocol. This included 6 channels of EEG, 2 channels of EOG, chin EMG, bilateral anterior tibialis EMG, nasal/oral thermistor, PTAF (nasal pressure transducer), chest and abdominal wall movements, EKG, and pulse oximetry.  Description: The total recording time was 408.4 minutes. The total sleep time was 389.5 minutes. There were a total of 15.2 minutes of wakefulness after sleep onset for a very goodsleep efficiency of 95.4%. The latency to sleep onset was shortat 3.7 minutes. The R sleep onset latency was within normal limits at 61.0 minutes. Sleep parameters, as a percentage of the total sleep time, demonstrated 3.2% of sleep was in N1 sleep, 44.8% N2, 15.1% N3 and 36.8% R sleep. There were a total of 66 arousals for an arousal index of 10.2 arousals per hour of sleep that was normal.  Overall, there were a total of 28 respiratory events for a respiratory disturbance index, which includes apneas, hypopneas and RERAs (increased respiratory effort) of 4.3 respiratory events per hour of sleep during the pressure titration.  was initiated at 4 cm H2O at lights out, 11:55 p.m. It was titrated in 1-2 cm increments for intermittent respiratory events and snoring to the final pressure of 9 cm H2O. The apnea was well controlled at the final pressure and supine, REM sleep was  observed.  Additionally, the baseline oxygen saturation during wakefulness was 98%, during NREM sleep averaged 98%, and during REM sleep averaged 98%. The total duration of oxygen < 90% was 0.0 minutes.  Cardiac monitoring-  significant cardiac rhythm irregularities.   Periodic limb movement monitoring- did not demonstrate periodic limb movements.   Impression: This patient's obstructive sleep apnea demonstrated significant improvement with the utilization of nasal  at 9 cm H2O.      Recommendations: Would recommend utilization of nasal  at 9 cm H2O.      A medium ResMed Airfit N20, was used. Chin strap used during study- no. Humidifier used during study- yes.     Allyne Gee, MD, [facility] Diplomate ABMS-Pulmonary, Critical Care and Sleep Medicine  Electronically reviewed and digitally signed   [place] CPAP/BIPAP Polysomnogram Report Part II Phone: [phone] Fax: [phone]  Patient last name Podolsky Neck Size 15.5 in. Acquisition [id number]  Patient first name Toni A. Weight 335.0 lbs. Started 10/04/2022 at 11:47:17 PM  Birth date 10-21-1985 Height 64.0 in. Stopped 10/05/2022 at 6:58:29 AM  Age 66      Type Adult BMI 57.5 lb/in2 Duration 408.4  Report generated by Juleen Starr, RPSGT  Reviewed by: Richelle Ito. Henke, PhD, ABSM, FAASM Sleep Data: Lights Out: 11:55:05 PM Sleep Onset: 11:58:47 PM  Lights On: 6:43:29 AM Sleep Efficiency: 95.4 %  Total Recording Time: 408.4 min Sleep Latency (from Lights Off) 3.7 min  Total Sleep Time (TST): 389.5 min R Latency (from Sleep Onset): 61.0 min  Sleep Period Time: 404.5 min Total number of awakenings: 13  Wake during sleep: 15.0 min Wake After Sleep Onset (WASO): 15.2 min  Sleep Data:         Arousal Summary: Stage  Latency from lights out (min) Latency from sleep onset (min) Duration (min) % Total Sleep Time  Normal values  N 1 3.7 0.0 12.5 3.2 (5%)  N 2 4.7 1.0 174.5 44.8 (50%)  N 3 30.2 26.5 59.0 15.1 (20%)   R 64.7 61.0 143.5 36.8 (25%)   Number Index  Spontaneous 61 9.4  Apneas & Hypopneas 2 0.3  RERAs 14 2.2       (Apneas & Hypopneas & RERAs)  (16) (2.5)  Limb Movement 1 0.2  Snore 0 0.0  TOTAL 78 12.0     Respiratory Data:  CA OA MA Apnea Hypopnea* A+ H RERA Total  Number 0 6 2 8 6 14 14 28   Mean Dur (sec) 0.0 14.3 26.3 17.3 21.0 18.9 25.6 22.2  Max Dur (sec) 0.0 17.0 27.0 27.0 30.0 30.0 50.5 50.5  Total Dur (min) 0.0 1.4 0.9 2.3 2.1 4.4 6.0 10.4  % of TST 0.0 0.4 0.2 0.6 0.5 1.1 1.5 2.7  Index (#/h TST) 0.0 0.9 0.3 1.2 0.9 2.2 2.2 4.3  *Hypopneas scored based on 4% or greater desaturation.  Sleep Stage:         REM NREM TST  AHI 5.0 0.2 2.2  RDI 10.0 0.7 4.3   Sleep (min) TST (%) REM (min) NREM (min) CA (#) OA (#) MA (#) HYP (#) AHI (#/h) RERA (#) RDI (#/h) Desat (#)  Supine 280.1 71.91 123.0 157.1 0 6 1 6  2.8 7 4.3 14  Non-Supine 109.40 28.09 20.50 88.90 0.00 0.00 1.00 0.00 0.55 7.00 4.39 2.00  Left: 109.4 28.09 20.5 88.9 0 0 1 0 0.5 7 4.4 2     Snoring: Total number of snoring episodes  0  Total time with snoring    min (   % of sleep)   Oximetry Distribution:             WK REM NREM TOTAL  Average (%)   98 98 98 98  < 90% 0.0 0.0 0.0 0.0  < 80% 0.0 0.0 0.0 0.0  < 70% 0.0 0.0 0.0 0.0  # of Desaturations* 1 6 1 8   Desat Index (#/hour) 3.2 2.5 0.2 1.2  Desat Max (%) 4 5 3 5   Desat Max Dur (sec) 10.0 38.0 10.0 38.0  Approx Min O2 during sleep 92  Approx min O2 during a respiratory event 92  Was Oxygen added (Y/N) and final rate :    LPM  *Desaturations based on 3% or greater drop from baseline.   Cheyne Stokes Breathing: None Present    Heart Rate Summary:  Average Heart Rate During Sleep 73.4 bpm      Highest Heart Rate During Sleep (95th %) 80.0 bpm      Highest Heart Rate During Sleep 198 bpm (artifact)  Highest Heart Rate During Recording (TIB) 198 bpm (artifact)   Heart Rate Observations: Event Type # Events   Bradycardia 0 Lowest HR  Scored: N/A  Sinus Tachycardia During Sleep 0 Highest HR Scored: N/A  Narrow Complex Tachycardia 0 Highest HR Scored: N/A  Wide Complex Tachycardia 0 Highest HR Scored: N/A  Asystole 0 Longest Pause: N/A  Atrial Fibrillation 0 Duration Longest Event: N/A  Other Arrythmias   Type:   Periodic Limb Movement Data: (Primary legs unless otherwise noted) Total # Limb Movement 2 Limb Movement Index 0.3  Total # PLMS    PLMS Index  Total # PLMS Arousals    PLMS Arousal Index     Percentage Sleep Time with PLMS   min (   % sleep)  Mean Duration limb movements (secs)       IPAP Level (cmH2O) EPAP Level (cmH2O) Total Duration (min) Sleep Duration (min) Sleep (%) REM (%) CA  #) OA # MA # HYP #) AHI (#/hr) RERAs # RERAs (#/hr) RDI (#/hr)  4 4 160.2 157.2 98.1 12.8 0 0 1 0 0.4 7 2.7 3.1  5 5  28.2 20.8 73.8 29.4 0 3 1 1  14.4 2 5.8 20.2  7 7  28.5 28.5 100.0 100.0 0 3 0 0 6.3 4 8.4 14.7  8 8  146.3 142.3 97.3 41.6 0 0 0 5 2.1 0 0.0 2.1  9 9  39.9 39.4 98.7 61.2 0 0 0 0 0.0 0 0.0 0.0

## 2023-03-09 ENCOUNTER — Other Ambulatory Visit: Payer: Self-pay

## 2023-03-09 ENCOUNTER — Encounter: Payer: Self-pay | Admitting: *Deleted

## 2023-03-09 DIAGNOSIS — R1110 Vomiting, unspecified: Secondary | ICD-10-CM | POA: Insufficient documentation

## 2023-03-09 DIAGNOSIS — Z5321 Procedure and treatment not carried out due to patient leaving prior to being seen by health care provider: Secondary | ICD-10-CM | POA: Insufficient documentation

## 2023-03-09 DIAGNOSIS — R197 Diarrhea, unspecified: Secondary | ICD-10-CM | POA: Insufficient documentation

## 2023-03-09 LAB — CBC
HCT: 36.7 % (ref 36.0–46.0)
Hemoglobin: 12.5 g/dL (ref 12.0–15.0)
MCH: 27.5 pg (ref 26.0–34.0)
MCHC: 34.1 g/dL (ref 30.0–36.0)
MCV: 80.7 fL (ref 80.0–100.0)
Platelets: 429 10*3/uL — ABNORMAL HIGH (ref 150–400)
RBC: 4.55 MIL/uL (ref 3.87–5.11)
RDW: 15 % (ref 11.5–15.5)
WBC: 8.1 10*3/uL (ref 4.0–10.5)
nRBC: 0 % (ref 0.0–0.2)

## 2023-03-09 LAB — COMPREHENSIVE METABOLIC PANEL
ALT: 26 U/L (ref 0–44)
AST: 21 U/L (ref 15–41)
Albumin: 4.2 g/dL (ref 3.5–5.0)
Alkaline Phosphatase: 75 U/L (ref 38–126)
Anion gap: 16 — ABNORMAL HIGH (ref 5–15)
BUN: 12 mg/dL (ref 6–20)
CO2: 16 mmol/L — ABNORMAL LOW (ref 22–32)
Calcium: 9.4 mg/dL (ref 8.9–10.3)
Chloride: 105 mmol/L (ref 98–111)
Creatinine, Ser: 1.18 mg/dL — ABNORMAL HIGH (ref 0.44–1.00)
GFR, Estimated: >60 mL/min (ref >60)
Glucose, Bld: 94 mg/dL (ref 70–99)
Potassium: 3.7 mmol/L (ref 3.5–5.1)
Sodium: 137 mmol/L (ref 135–145)
Total Bilirubin: 1 mg/dL (ref 0.3–1.2)
Total Protein: 7.9 g/dL (ref 6.5–8.1)

## 2023-03-09 LAB — LIPASE, BLOOD: Lipase: 57 U/L — ABNORMAL HIGH (ref 11–51)

## 2023-03-09 NOTE — ED Triage Notes (Addendum)
Pt had bariatric surgery 2 weeks ago at [facility]   Pt has vomiting and diarrhea for 2 days.  No abd pain.  Pt alert.

## 2023-03-09 NOTE — ED Notes (Signed)
Pt to triage desk stating she has been unable to void despite great effort. Pt informed to hold onto labeled urine cup and try again when she feels the urge to urinate later in her stay in the ED. Pt stated "Okay" and denied any further needs at this time.

## 2023-03-09 NOTE — ED Notes (Signed)
 Pt denied being able to produce a urine sample at this time. Pt provided with a labeled specimen cup and instructions to return cup to triage nurse desk once it has a clean catch urine sample.

## 2023-03-10 ENCOUNTER — Emergency Department
Admission: EM | Admit: 2023-03-10 | Discharge: 2023-03-10 | Discharge status: Against Medical Advice | Payer: Self-pay | Attending: Emergency Medicine | Admitting: Emergency Medicine

## 2024-07-18 ENCOUNTER — Emergency Department
Admission: EM | Admit: 2024-07-18 | Discharge: 2024-07-18 | Disposition: A | Discharge status: Home / Self Care | Payer: Self-pay | Attending: Emergency Medicine | Admitting: Emergency Medicine

## 2024-07-18 ENCOUNTER — Other Ambulatory Visit: Payer: Self-pay

## 2024-07-18 DIAGNOSIS — I10 Essential (primary) hypertension: Secondary | ICD-10-CM | POA: Insufficient documentation

## 2024-07-18 DIAGNOSIS — Z8616 Personal history of COVID-19: Secondary | ICD-10-CM | POA: Insufficient documentation

## 2024-07-18 DIAGNOSIS — B349 Viral infection, unspecified: Secondary | ICD-10-CM | POA: Insufficient documentation

## 2024-07-18 DIAGNOSIS — J4520 Mild intermittent asthma, uncomplicated: Secondary | ICD-10-CM | POA: Insufficient documentation

## 2024-07-18 LAB — CBC WITH DIFFERENTIAL/PLATELET
Abs Immature Granulocytes: 0.03 K/uL (ref 0.00–0.07)
Basophils Absolute: 0 K/uL (ref 0.0–0.1)
Basophils Relative: 0 %
Eosinophils Absolute: 0 K/uL (ref 0.0–0.5)
Eosinophils Relative: 0 %
HCT: 37.7 % (ref 36.0–46.0)
Hemoglobin: 12.3 g/dL (ref 12.0–15.0)
Immature Granulocytes: 0 %
Lymphocytes Relative: 28 %
Lymphs Abs: 2.5 K/uL (ref 0.7–4.0)
MCH: 28.1 pg (ref 26.0–34.0)
MCHC: 32.6 g/dL (ref 30.0–36.0)
MCV: 86.1 fL (ref 80.0–100.0)
Monocytes Absolute: 0.5 K/uL (ref 0.1–1.0)
Monocytes Relative: 6 %
Neutro Abs: 5.9 K/uL (ref 1.7–7.7)
Neutrophils Relative %: 66 %
Platelets: 300 K/uL (ref 150–400)
RBC: 4.38 MIL/uL (ref 3.87–5.11)
RDW: 13.7 % (ref 11.5–15.5)
WBC: 9 K/uL (ref 4.0–10.5)
nRBC: 0 % (ref 0.0–0.2)

## 2024-07-18 LAB — BASIC METABOLIC PANEL WITH GFR
Anion gap: 10 (ref 5–15)
BUN: 13 mg/dL (ref 6–20)
CO2: 23 mmol/L (ref 22–32)
Calcium: 8.3 mg/dL — ABNORMAL LOW (ref 8.9–10.3)
Chloride: 109 mmol/L (ref 98–111)
Creatinine, Ser: 0.86 mg/dL (ref 0.44–1.00)
GFR, Estimated: >60 mL/min
Glucose, Bld: 86 mg/dL (ref 70–99)
Potassium: 3.9 mmol/L (ref 3.5–5.1)
Sodium: 142 mmol/L (ref 135–145)

## 2024-07-18 MED ORDER — ONDANSETRON HCL 4 MG/2ML IJ SOLN
4.0000 mg | Freq: Once | INTRAMUSCULAR | Status: AC
  Administered 2024-07-18: 4 mg via INTRAVENOUS
  Filled 2024-07-18: qty 2

## 2024-07-18 MED ORDER — ACETAMINOPHEN 325 MG PO TABS
650.0000 mg | ORAL_TABLET | Freq: Once | ORAL | Status: AC
  Administered 2024-07-18: 650 mg via ORAL
  Filled 2024-07-18: qty 2

## 2024-07-18 MED ORDER — SODIUM CHLORIDE 0.9 % IV BOLUS
1000.0000 mL | Freq: Once | INTRAVENOUS | Status: AC
  Administered 2024-07-18: 1000 mL via INTRAVENOUS

## 2024-07-18 NOTE — Discharge Instructions (Signed)
 Your blood work is normal.  You were treated with IV fluids.  Please continue to take Tylenol  per package instructions to help with your symptoms.  Please return for any new, worsening, or changing symptoms or other concerns.  Was a pleasure caring for you today.

## 2024-07-18 NOTE — ED Triage Notes (Signed)
 Pt come with vomiting and body aches for the last day and half.

## 2024-07-18 NOTE — ED Provider Notes (Signed)
 "  [facility] Provider Note    Event Date/Time   First MD Initiated Contact with Patient 07/18/24 1351     (approximate)   History   Generalized Body Aches   HPI  Daneesha Quinteros is a 39 y.o. female with a past medical history of hypertension, GERD, asthma, fibromyalgia, bariatric surgery who presents today for evaluation of bodyaches, nausea, vomiting, diarrhea, cough, and nasal congestion for the past 3 to 4 days.  Patient reports that someone in her workplace was sick with influenza.  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  No fevers or chills.  No abdominal pain, chest pain, shortness of breath.  Patient Active Problem List   Diagnosis Date Noted   OSA (obstructive sleep apnea) 06/09/2022   Essential hypertension    COVID-19 virus infection 01/07/2021   Generalized weakness 01/07/2021   Cough 01/07/2021   Leukocytosis 01/07/2021   Acute intractable headache 01/07/2021   GERD (gastroesophageal reflux disease) 01/07/2021   Mild intermittent asthma 01/07/2021   Otitis media    Flank pain    Morbid obesity with BMI of 50.0-59.9, adult (HCC)    Fibromyalgia    Allergic rhinitis 08/14/2020   Headache disorder 10/20/2019   Prediabetes 04/22/2018   Anxiety, generalized 04/03/2017          Physical Exam   Triage Vital Signs: ED Triage Vitals [07/18/24 1331]  Encounter Vitals Group     BP (!) 145/78     Girls Systolic BP Percentile      Girls Diastolic BP Percentile      Boys Systolic BP Percentile      Boys Diastolic BP Percentile      Pulse Rate 80     Resp 18     Temp 98.7 F (37.1 C)     Temp src      SpO2 96 %     Weight 245 lb (111.1 kg)     Height 5' 4 (1.626 m)     Head Circumference      Peak Flow      Pain Score 6     Pain Loc      Pain Education      Exclude from Growth Chart     Most recent vital signs: Vitals:   07/18/24 1331 07/18/24 1631  BP: (!) 145/78 134/68  Pulse: 80 72  Resp:  18 16  Temp: 98.7 F (37.1 C) 98 F (36.7 C)  SpO2: 96% 97%    Physical Exam Vitals and nursing note reviewed.  Constitutional:      General: Awake and alert. No acute distress.    Appearance: Normal appearance.  HENT:     Head: Normocephalic and atraumatic.     Mouth: Mucous membranes are moist.  Eyes:     General: PERRL. Normal EOMs        Right eye: No discharge.        Left eye: No discharge.     Conjunctiva/sclera: Conjunctivae normal.  Cardiovascular:     Rate and Rhythm: Normal rate and regular rhythm.     Pulses: Normal pulses.  Pulmonary:     Effort: Pulmonary effort is normal. No respiratory distress.     Breath sounds: Normal breath sounds.  Abdominal:     Abdomen is soft. There is no abdominal tenderness. No rebound or guarding. No distention. Musculoskeletal:        General: No swelling. Normal range of motion.  Cervical back: Normal range of motion and neck supple.  Skin:    General: Skin is warm and dry.     Capillary Refill: Capillary refill takes less than 2 seconds.     Findings: No rash.  Neurological:     Mental Status: The patient is awake and alert.      ED Results / Procedures / Treatments   Labs (all labs ordered are listed, but only abnormal results are displayed) Labs Reviewed  BASIC METABOLIC PANEL WITH GFR - Abnormal; Notable for the following components:      Result Value   Calcium 8.3 (*)    All other components within normal limits  CBC WITH DIFFERENTIAL/PLATELET     EKG     RADIOLOGY     PROCEDURES:  Critical Care performed:   Procedures   MEDICATIONS ORDERED IN ED: Medications  sodium chloride  0.9 % bolus 1,000 mL (0 mLs Intravenous Stopped 07/18/24 1634)  acetaminophen  (TYLENOL ) tablet 650 mg (650 mg Oral Given 07/18/24 1527)  ondansetron  (ZOFRAN ) injection 4 mg (4 mg Intravenous Given 07/18/24 1524)     IMPRESSION / MDM / ASSESSMENT AND PLAN / ED COURSE  I reviewed the triage vital signs and the nursing  notes.   Differential diagnosis includes, but is not limited to, gastroenteritis, COVID-19, influenza, dehydration, electrolyte disarray  Patient is awake and alert, hemodynamically stable and afebrile.  She is nontoxic in appearance.  Her abdomen is soft and nontender throughout.  She has moist mucous membranes and normal skin turgor.  IV was established and labs were obtained.  She was treated with IV fluids, as well as Zofran  and Tylenol  with good effect.  Labs are overall reassuring.  Abdomen is soft and nontender throughout and she has no abdominal pain, do not suspect intra-abdominal abnormality.  Upon reevaluation, she reports that she feels improved and ready for discharge home.  She did request a work note which was provided.  Viral syndrome is most likely etiology including influenza given her known sick contact, as well as her symptoms of cough, nasal congestion, nausea, vomiting, diarrhea.  However, she is out of the window for treatment with Tamiflu.  I discussed with the patient who understands and agrees.  We discussed symptomatic management and return precautions.  She was given a work note per request.  Patient understands and agrees with plan.  Discharged in stable condition.   Patient's presentation is most consistent with acute complicated illness / injury requiring diagnostic workup.    FINAL CLINICAL IMPRESSION(S) / ED DIAGNOSES   Final diagnoses:  Viral syndrome     Rx / DC Orders   ED Discharge Orders     None        Note:  This document was prepared using Dragon voice recognition software and may include unintentional dictation errors.   Chanique Duca E, PA-C 07/18/24 1648    Willo Dunnings, MD 07/18/24 2336  "
